# Patient Record
Sex: Female | Born: 1935 | Race: White | Hispanic: No | Smoking: Never smoker
Health system: Southern US, Community
[De-identification: ages and names within clinical notes are randomized; demographics above are authoritative.]

## PROBLEM LIST (undated history)

## (undated) DIAGNOSIS — C801 Malignant (primary) neoplasm, unspecified: Secondary | ICD-10-CM

## (undated) DIAGNOSIS — E079 Disorder of thyroid, unspecified: Secondary | ICD-10-CM

## (undated) DIAGNOSIS — M810 Age-related osteoporosis without current pathological fracture: Secondary | ICD-10-CM

## (undated) DIAGNOSIS — M1990 Unspecified osteoarthritis, unspecified site: Secondary | ICD-10-CM

## (undated) DIAGNOSIS — G4700 Insomnia, unspecified: Secondary | ICD-10-CM

## (undated) DIAGNOSIS — S329XXA Fracture of unspecified parts of lumbosacral spine and pelvis, initial encounter for closed fracture: Secondary | ICD-10-CM

## (undated) DIAGNOSIS — C50919 Malignant neoplasm of unspecified site of unspecified female breast: Secondary | ICD-10-CM

## (undated) DIAGNOSIS — F32A Depression, unspecified: Secondary | ICD-10-CM

## (undated) DIAGNOSIS — E785 Hyperlipidemia, unspecified: Secondary | ICD-10-CM

## (undated) DIAGNOSIS — K219 Gastro-esophageal reflux disease without esophagitis: Secondary | ICD-10-CM

## (undated) DIAGNOSIS — F329 Major depressive disorder, single episode, unspecified: Secondary | ICD-10-CM

## (undated) DIAGNOSIS — Z950 Presence of cardiac pacemaker: Secondary | ICD-10-CM

## (undated) DIAGNOSIS — I4891 Unspecified atrial fibrillation: Secondary | ICD-10-CM

## (undated) DIAGNOSIS — I10 Essential (primary) hypertension: Secondary | ICD-10-CM

## (undated) HISTORY — DX: Fracture of unspecified parts of lumbosacral spine and pelvis, initial encounter for closed fracture: S32.9XXA

## (undated) HISTORY — DX: Disorder of thyroid, unspecified: E07.9

## (undated) HISTORY — DX: Essential (primary) hypertension: I10

## (undated) HISTORY — DX: Malignant (primary) neoplasm, unspecified: C80.1

## (undated) HISTORY — DX: Hyperlipidemia, unspecified: E78.5

## (undated) HISTORY — DX: Age-related osteoporosis without current pathological fracture: M81.0

## (undated) HISTORY — DX: Unspecified osteoarthritis, unspecified site: M19.90

## (undated) HISTORY — DX: Gastro-esophageal reflux disease without esophagitis: K21.9

## (undated) HISTORY — PX: ABDOMINAL HYSTERECTOMY: SHX81

## (undated) HISTORY — DX: Depression, unspecified: F32.A

## (undated) HISTORY — DX: Insomnia, unspecified: G47.00

## (undated) HISTORY — PX: CARDIAC DEFIBRILLATOR PLACEMENT: SHX171

## (undated) HISTORY — DX: Presence of cardiac pacemaker: Z95.0

## (undated) HISTORY — PX: CHOLECYSTECTOMY: SHX55

---
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
## 2000-04-17 ENCOUNTER — Encounter: Admission: RE | Admit: 2000-04-17 | Discharge: 2000-04-17 | Payer: Self-pay | Admitting: Rheumatology

## 2000-04-17 ENCOUNTER — Encounter: Payer: Self-pay | Admitting: Rheumatology

## 2001-02-12 ENCOUNTER — Ambulatory Visit (HOSPITAL_COMMUNITY): Admission: RE | Admit: 2001-02-12 | Discharge: 2001-02-12 | Payer: Self-pay | Admitting: Gastroenterology

## 2001-02-23 ENCOUNTER — Encounter: Payer: Self-pay | Admitting: *Deleted

## 2001-02-24 ENCOUNTER — Encounter: Payer: Self-pay | Admitting: General Surgery

## 2001-02-24 ENCOUNTER — Inpatient Hospital Stay (HOSPITAL_COMMUNITY): Admission: EM | Admit: 2001-02-24 | Discharge: 2001-03-01 | Payer: Self-pay | Admitting: *Deleted

## 2001-02-25 ENCOUNTER — Encounter: Payer: Self-pay | Admitting: General Surgery

## 2001-03-08 ENCOUNTER — Encounter: Admission: RE | Admit: 2001-03-08 | Discharge: 2001-06-06 | Payer: Self-pay | Admitting: General Surgery

## 2001-06-13 ENCOUNTER — Encounter: Payer: Self-pay | Admitting: Neurosurgery

## 2001-06-13 ENCOUNTER — Encounter: Admission: RE | Admit: 2001-06-13 | Discharge: 2001-06-13 | Payer: Self-pay | Admitting: Neurosurgery

## 2001-08-15 ENCOUNTER — Encounter: Payer: Self-pay | Admitting: *Deleted

## 2001-08-15 ENCOUNTER — Encounter: Admission: RE | Admit: 2001-08-15 | Discharge: 2001-08-15 | Payer: Self-pay | Admitting: *Deleted

## 2001-09-07 ENCOUNTER — Encounter: Payer: Self-pay | Admitting: Rheumatology

## 2001-09-07 ENCOUNTER — Encounter: Admission: RE | Admit: 2001-09-07 | Discharge: 2001-09-07 | Payer: Self-pay | Admitting: Rheumatology

## 2002-05-22 ENCOUNTER — Encounter: Payer: Self-pay | Admitting: Rheumatology

## 2002-05-22 ENCOUNTER — Encounter: Admission: RE | Admit: 2002-05-22 | Discharge: 2002-05-22 | Payer: Self-pay | Admitting: Rheumatology

## 2002-11-20 ENCOUNTER — Encounter: Payer: Self-pay | Admitting: Rheumatology

## 2002-11-20 ENCOUNTER — Encounter: Admission: RE | Admit: 2002-11-20 | Discharge: 2002-11-20 | Payer: Self-pay | Admitting: Rheumatology

## 2003-05-08 ENCOUNTER — Encounter
Admission: RE | Admit: 2003-05-08 | Discharge: 2003-08-06 | Payer: Self-pay | Admitting: Physical Medicine & Rehabilitation

## 2003-11-06 ENCOUNTER — Encounter: Admission: RE | Admit: 2003-11-06 | Discharge: 2003-11-06 | Payer: Self-pay | Admitting: Rheumatology

## 2004-01-08 ENCOUNTER — Encounter: Admission: RE | Admit: 2004-01-08 | Discharge: 2004-01-08 | Payer: Self-pay | Admitting: Rheumatology

## 2004-08-17 ENCOUNTER — Emergency Department (HOSPITAL_COMMUNITY): Admission: EM | Admit: 2004-08-17 | Discharge: 2004-08-17 | Payer: Self-pay | Admitting: Emergency Medicine

## 2004-11-07 HISTORY — PX: ELBOW SURGERY: SHX618

## 2005-01-27 ENCOUNTER — Encounter
Admission: RE | Admit: 2005-01-27 | Discharge: 2005-04-27 | Payer: Self-pay | Admitting: Physical Medicine & Rehabilitation

## 2005-01-31 ENCOUNTER — Ambulatory Visit: Payer: Self-pay | Admitting: Physical Medicine & Rehabilitation

## 2005-03-11 ENCOUNTER — Encounter: Admission: RE | Admit: 2005-03-11 | Discharge: 2005-03-11 | Payer: Self-pay | Admitting: Rheumatology

## 2005-06-27 ENCOUNTER — Inpatient Hospital Stay (HOSPITAL_COMMUNITY): Admission: EM | Admit: 2005-06-27 | Discharge: 2005-06-30 | Payer: Self-pay | Admitting: Emergency Medicine

## 2006-03-14 ENCOUNTER — Encounter: Admission: RE | Admit: 2006-03-14 | Discharge: 2006-03-14 | Payer: Self-pay | Admitting: Rheumatology

## 2007-04-04 ENCOUNTER — Encounter: Admission: RE | Admit: 2007-04-04 | Discharge: 2007-04-04 | Payer: Self-pay | Admitting: Rheumatology

## 2008-04-04 ENCOUNTER — Encounter: Admission: RE | Admit: 2008-04-04 | Discharge: 2008-04-04 | Payer: Self-pay | Admitting: Rheumatology

## 2008-11-07 DIAGNOSIS — S329XXA Fracture of unspecified parts of lumbosacral spine and pelvis, initial encounter for closed fracture: Secondary | ICD-10-CM

## 2008-11-07 DIAGNOSIS — Z950 Presence of cardiac pacemaker: Secondary | ICD-10-CM

## 2008-11-07 HISTORY — DX: Presence of cardiac pacemaker: Z95.0

## 2008-11-07 HISTORY — DX: Fracture of unspecified parts of lumbosacral spine and pelvis, initial encounter for closed fracture: S32.9XXA

## 2009-04-08 ENCOUNTER — Encounter: Admission: RE | Admit: 2009-04-08 | Discharge: 2009-04-08 | Payer: Self-pay | Admitting: Rheumatology

## 2009-11-07 DIAGNOSIS — C50919 Malignant neoplasm of unspecified site of unspecified female breast: Secondary | ICD-10-CM

## 2009-11-07 DIAGNOSIS — C801 Malignant (primary) neoplasm, unspecified: Secondary | ICD-10-CM

## 2009-11-07 HISTORY — DX: Malignant neoplasm of unspecified site of unspecified female breast: C50.919

## 2009-11-07 HISTORY — DX: Malignant (primary) neoplasm, unspecified: C80.1

## 2010-04-13 ENCOUNTER — Encounter: Admission: RE | Admit: 2010-04-13 | Discharge: 2010-04-13 | Payer: Self-pay | Admitting: Internal Medicine

## 2010-04-20 ENCOUNTER — Encounter: Admission: RE | Admit: 2010-04-20 | Discharge: 2010-04-20 | Payer: Self-pay | Admitting: Internal Medicine

## 2010-04-23 ENCOUNTER — Encounter: Admission: RE | Admit: 2010-04-23 | Discharge: 2010-04-23 | Payer: Self-pay | Admitting: Internal Medicine

## 2010-05-23 HISTORY — PX: BREAST LUMPECTOMY: SHX2

## 2010-05-28 ENCOUNTER — Encounter: Admission: RE | Admit: 2010-05-28 | Discharge: 2010-05-28 | Payer: Self-pay | Admitting: General Surgery

## 2010-05-28 ENCOUNTER — Ambulatory Visit (HOSPITAL_COMMUNITY): Admission: RE | Admit: 2010-05-28 | Discharge: 2010-05-28 | Payer: Self-pay | Admitting: General Surgery

## 2010-06-09 ENCOUNTER — Ambulatory Visit: Payer: Self-pay | Admitting: Oncology

## 2010-06-23 LAB — COMPREHENSIVE METABOLIC PANEL
ALT: 16 U/L (ref 0–35)
Albumin: 4.4 g/dL (ref 3.5–5.2)
Alkaline Phosphatase: 50 U/L (ref 39–117)
BUN: 17 mg/dL (ref 6–23)
Calcium: 9.5 mg/dL (ref 8.4–10.5)
Glucose, Bld: 93 mg/dL (ref 70–99)
Total Protein: 7 g/dL (ref 6.0–8.3)

## 2010-06-23 LAB — CBC WITH DIFFERENTIAL/PLATELET
BASO%: 0.5 % (ref 0.0–2.0)
Eosinophils Absolute: 0.3 10*3/uL (ref 0.0–0.5)
HCT: 40.6 % (ref 34.8–46.6)
MCV: 90.2 fL (ref 79.5–101.0)
NEUT#: 3.9 10*3/uL (ref 1.5–6.5)
NEUT%: 58.9 % (ref 38.4–76.8)
Platelets: 282 10*3/uL (ref 145–400)
WBC: 6.6 10*3/uL (ref 3.9–10.3)
lymph#: 1.8 10*3/uL (ref 0.9–3.3)

## 2010-06-23 LAB — CANCER ANTIGEN 27.29: CA 27.29: 6 U/mL (ref 0–39)

## 2010-07-02 ENCOUNTER — Encounter: Admission: RE | Admit: 2010-07-02 | Discharge: 2010-07-02 | Payer: Self-pay | Admitting: Internal Medicine

## 2010-08-20 ENCOUNTER — Ambulatory Visit: Payer: Self-pay | Admitting: Oncology

## 2010-08-24 LAB — CBC WITH DIFFERENTIAL/PLATELET
BASO%: 0.4 % (ref 0.0–2.0)
Basophils Absolute: 0 10*3/uL (ref 0.0–0.1)
Eosinophils Absolute: 0.2 10*3/uL (ref 0.0–0.5)
LYMPH%: 29 % (ref 14.0–49.7)
MCV: 89.3 fL (ref 79.5–101.0)
MONO%: 8.5 % (ref 0.0–14.0)
Platelets: 259 10*3/uL (ref 145–400)
RBC: 4.35 10*6/uL (ref 3.70–5.45)
WBC: 6.9 10*3/uL (ref 3.9–10.3)
lymph#: 2 10*3/uL (ref 0.9–3.3)

## 2010-08-25 LAB — COMPREHENSIVE METABOLIC PANEL
ALT: 14 U/L (ref 0–35)
AST: 23 U/L (ref 0–37)
Albumin: 4.2 g/dL (ref 3.5–5.2)
BUN: 16 mg/dL (ref 6–23)
CO2: 23 mEq/L (ref 19–32)
Calcium: 9.7 mg/dL (ref 8.4–10.5)
Creatinine, Ser: 0.74 mg/dL (ref 0.40–1.20)
Total Bilirubin: 0.4 mg/dL (ref 0.3–1.2)
Total Protein: 6.6 g/dL (ref 6.0–8.3)

## 2010-08-25 LAB — VITAMIN D 25 HYDROXY (VIT D DEFICIENCY, FRACTURES): Vit D, 25-Hydroxy: 52 ng/mL (ref 30–89)

## 2010-09-01 ENCOUNTER — Ambulatory Visit (HOSPITAL_COMMUNITY): Admission: RE | Admit: 2010-09-01 | Discharge: 2010-09-01 | Payer: Self-pay | Admitting: Oncology

## 2010-10-15 ENCOUNTER — Encounter
Admission: RE | Admit: 2010-10-15 | Discharge: 2010-10-15 | Payer: Self-pay | Source: Home / Self Care | Attending: Internal Medicine | Admitting: Internal Medicine

## 2010-11-16 ENCOUNTER — Encounter
Admission: RE | Admit: 2010-11-16 | Discharge: 2010-11-16 | Payer: Self-pay | Source: Home / Self Care | Attending: Oncology | Admitting: Oncology

## 2010-11-23 ENCOUNTER — Ambulatory Visit: Payer: Self-pay | Admitting: Oncology

## 2010-11-25 LAB — CBC WITH DIFFERENTIAL/PLATELET
BASO%: 0.4 % (ref 0.0–2.0)
Basophils Absolute: 0 10*3/uL (ref 0.0–0.1)
Eosinophils Absolute: 0.4 10*3/uL (ref 0.0–0.5)
HCT: 39.7 % (ref 34.8–46.6)
LYMPH%: 26.7 % (ref 14.0–49.7)
MCV: 90.3 fL (ref 79.5–101.0)
MONO#: 0.5 10*3/uL (ref 0.1–0.9)
MONO%: 6.7 % (ref 0.0–14.0)
NEUT#: 4.4 10*3/uL (ref 1.5–6.5)
NEUT%: 60.3 % (ref 38.4–76.8)
Platelets: 237 10*3/uL (ref 145–400)
RBC: 4.4 10*6/uL (ref 3.70–5.45)
RDW: 13.7 % (ref 11.2–14.5)
WBC: 7.3 10*3/uL (ref 3.9–10.3)
lymph#: 2 10*3/uL (ref 0.9–3.3)

## 2010-11-26 LAB — COMPREHENSIVE METABOLIC PANEL
ALT: 15 U/L (ref 0–35)
AST: 22 U/L (ref 0–37)
Albumin: 4.4 g/dL (ref 3.5–5.2)
Alkaline Phosphatase: 45 U/L (ref 39–117)
BUN: 17 mg/dL (ref 6–23)
CO2: 24 mEq/L (ref 19–32)
Calcium: 10.2 mg/dL (ref 8.4–10.5)
Chloride: 103 mEq/L (ref 96–112)
Creatinine, Ser: 0.76 mg/dL (ref 0.40–1.20)
Glucose, Bld: 123 mg/dL — ABNORMAL HIGH (ref 70–99)
Potassium: 4.2 mEq/L (ref 3.5–5.3)
Sodium: 139 mEq/L (ref 135–145)
Total Bilirubin: 0.5 mg/dL (ref 0.3–1.2)
Total Protein: 6.8 g/dL (ref 6.0–8.3)

## 2010-11-26 LAB — VITAMIN D 25 HYDROXY (VIT D DEFICIENCY, FRACTURES): Vit D, 25-Hydroxy: 47 ng/mL (ref 30–89)

## 2010-11-28 ENCOUNTER — Encounter: Payer: Self-pay | Admitting: Internal Medicine

## 2010-12-13 ENCOUNTER — Encounter (HOSPITAL_BASED_OUTPATIENT_CLINIC_OR_DEPARTMENT_OTHER): Payer: MEDICARE | Admitting: Oncology

## 2010-12-13 DIAGNOSIS — I82509 Chronic embolism and thrombosis of unspecified deep veins of unspecified lower extremity: Secondary | ICD-10-CM

## 2010-12-13 DIAGNOSIS — Z170 Estrogen receptor positive status [ER+]: Secondary | ICD-10-CM

## 2010-12-13 DIAGNOSIS — C50419 Malignant neoplasm of upper-outer quadrant of unspecified female breast: Secondary | ICD-10-CM

## 2010-12-13 DIAGNOSIS — Z7901 Long term (current) use of anticoagulants: Secondary | ICD-10-CM

## 2011-01-22 LAB — COMPREHENSIVE METABOLIC PANEL
Albumin: 4.3 g/dL (ref 3.5–5.2)
BUN: 10 mg/dL (ref 6–23)
GFR calc Af Amer: >60 mL/min (ref >60)
Glucose, Bld: 102 mg/dL — ABNORMAL HIGH (ref 70–99)
Potassium: 4.6 mEq/L (ref 3.5–5.1)
Sodium: 140 mEq/L (ref 135–145)
Total Protein: 7 g/dL (ref 6.0–8.3)

## 2011-01-22 LAB — CBC
HCT: 42.8 % (ref 36.0–46.0)
Hemoglobin: 14.6 g/dL (ref 12.0–15.0)
MCH: 31.3 pg (ref 26.0–34.0)
MCV: 91.4 fL (ref 78.0–100.0)
Platelets: 248 10*3/uL (ref 150–400)
RBC: 4.68 MIL/uL (ref 3.87–5.11)

## 2011-01-22 LAB — DIFFERENTIAL
Basophils Relative: 1 % (ref 0–1)
Eosinophils Relative: 5 % (ref 0–5)
Lymphs Abs: 2.2 10*3/uL (ref 0.7–4.0)

## 2011-01-22 LAB — PROTIME-INR
INR: 1.09 (ref 0.00–1.49)
Prothrombin Time: 14 seconds (ref 11.6–15.2)

## 2011-01-22 LAB — APTT: aPTT: 27 seconds (ref 24–37)

## 2011-01-22 LAB — SURGICAL PCR SCREEN: Staphylococcus aureus: NEGATIVE

## 2011-03-21 ENCOUNTER — Other Ambulatory Visit: Payer: Self-pay | Admitting: Internal Medicine

## 2011-03-21 DIAGNOSIS — Z1231 Encounter for screening mammogram for malignant neoplasm of breast: Secondary | ICD-10-CM

## 2011-03-21 DIAGNOSIS — Z853 Personal history of malignant neoplasm of breast: Secondary | ICD-10-CM

## 2011-03-25 NOTE — Op Note (Signed)
NAME:  [name], [name] NO.:  [id number]   MEDICAL RECORD NO.:  [id number]          PATIENT TYPE:  INP   LOCATION:  5038                         FACILITY:  [facility]   PHYSICIAN:  Nadara Mustard, MD     DATE OF BIRTH:  1926/01/03   DATE OF PROCEDURE:  06/27/2005  DATE OF DISCHARGE:                                 OPERATIVE REPORT   PREOPERATIVE DIAGNOSIS:  Comminuted left olecranon fracture.   POSTOPERATIVE DIAGNOSIS:  Comminuted left olecranon fracture.   PROCEDURE:  Open reduction and internal fixation, left olecranon.   SURGEON.:  Nadara Mustard, M.D.   ANESTHESIA:  General plus axillary block on the left.   ESTIMATED BLOOD LOSS:  Minimal.   ANTIBIOTICS:  Kefzol 1 gram.   DRAINS:  None.   COMPLICATIONS:  None.   DISPOSITION:  To PACU in stable condition.   INDICATIONS FOR PROCEDURE:  The patient is a 77 year old woman who fell at  home sustaining a closed left olecranon fracture. The patient was admitted  last night, stabilized medically, and presents at this time for open  reduction and internal fixation. She had a completely displaced segmental  olecranon fracture. The risks and benefits were discussed with the patient  and her family including infection, neurovascular injury, persistent pain,  arthritis, failure of fixation, and need for additional surgery. The patient  and family state they understand and wish to proceed at this time.   DESCRIPTION OF PROCEDURE:  The patient was brought to OR room four and  underwent a general anesthetic. After adequate level of anesthesia was  obtained, the patient's left upper extremity was prepped using DuraPrep,  draped into a sterile field, and Ioban was used to cover all exposed skin. A  dorsal incision was made just on the ulnar border of the olecranon. The  ulnar nerve was protected. This was carried sharply down to bone and the  fracture fragments were freshened. There was a comminuted  piece distally of  the ulna and this was stabilized with a lag screw using a 26 mm 3.5 cortical  screw. The remaining  olecranon fragment was reduced and then this was  stabilized with two 2.0 mm K-wires and then a 20-mm tension band was used in  a figure-of-eight technique to reduce the fracture. C-arm fluoroscopy  verified reduction of both AP and lateral planes. The ends of the K-wires  were bent. The wound was irrigated with normal saline. The subcu was closed  using 2-0 Vicryl. The skin was closed using approximating staples. The wound  was covered with Adaptic orthopedic sponges, sterile Webril, and a Coban  dressing. Prior to placing the dressing, the patient's elbow was placed  through a full range of motion. There was no  impingement. There was full supination and pronation. The patient underwent  an axillary block by anesthesia. She was then extubated and taken to PACU in  stable condition. Plan to follow up in the office in 2 weeks to start  physical therapy. Discharge when safe with ambulation.      Nadara Mustard, MD  Electronically  Signed     MVD/MEDQ  D:  06/28/2005  T:  06/29/2005  Job:  [id number]
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
## 2011-03-25 NOTE — Assessment & Plan Note (Signed)
HISTORY:  [name] is back regarding her post-concussive syndrome with  depression, anxiety and insomnia.  [name] took the Rozerem for approximately  one week and found that it did not help her pain.  She essentially went back  to the Ambien.  She has tried to reset her priorities a bit regarding her  friends and activities, and seems to have a little more autonomy in these  areas.  She still feels that she has decreased endurance and fatigue.  She  takes a nap during the day as well.  Her appetite is fair, although her diet  has changed.  She is not eating as healthy as she once was.  She continues  on Paxil 20 mg q.d.   The patient is in a senior apartment and seems to be fairly happy there.   REVIEW OF SYSTEMS:  The patient reports occasional tingling, dizziness and  decreased taste.  No specific other changes since her last visit.  She is  concerned about her weight gain.  A full review of systems is in the health  and history section of the chart.   PHYSICAL EXAMINATION:  VITAL SIGNS:  Blood pressure 160/80, pulse 61,  respirations 20.  Saturation is 98% on room air.  GENERAL:  The patient is pleasant, alert and oriented x3.  Affect is bright  and appropriate.  She is well-kempt.  No constitutional symptoms today.  HEART:  A regular rhythm.  LUNGS:  Clear.  ABDOMEN:  Soft, nontender.  NEUROLOGIC:  Essentially is intact today with good strength, sensation and  cognition.  She is a bit anxious overall, however.  EXTREMITIES:  Unchanged.   ASSESSMENT:  1.  Post-concussive syndrome.  2.  Depression and anxiety.  3.  Insomnia.   PLAN:  1.  I have no further suggestions at this point from a medicinal standpoint.      We did talk at length about dietary changes and exercise, which I think      are paramount for her.  She needs to work on weight loss and eating      foods that will help give her more energy.  2.  Also she lets her anxiety get the best of her on many occasions.  I  will      continue her on Ambien for sleep as needed, and stay with the Paxil 20      mg for now.  3.  We discussed specific exercise programs that she should work on.  4.  The patient may follow up with me as needed in the future in my office.      ZTS/MedQ  D:  03/09/2005 10:48:00  T:  03/09/2005 11:10:53  Job #:  [id number]   cc:   Demetria Pore. Coral Spikes, M.D.  [address]  [address]  [place]  [place] [address]  Fax: [phone]

## 2011-03-25 NOTE — Procedures (Signed)
[place]. [facility]  Patient:    [name], [name]                         MRN: [id number] Proc. Date: 02/12/01 Attending:  Verlin Grills, M.D.                           Procedure Report  INDICATIONS:  [name] (date of birth 03-30-1926) is a 77 year old female who is scheduled to undergo surveillance colonoscopy and polypectomy to prevent colon cancer.  [name] brother developed colon cancer in his 85s.  [name] denies gastrointestinal bleeding or a personal history of colorectal neoplasia.  [name] reviewed our colonoscopy education film.  I discussed with her the complications associated with colonoscopy and polypectomy including a 1 per 1000 risk of bleeding and 4 per 1000 risk of colonic perforation requiring emergency surgery.  [name] has signed the operative permit.  MEDICATIONS: 1. Synthroid. 2. Ortho-Est. 3. Multivitamin. 4. Calcium. 5. Ambien.  PAST MEDICAL HISTORY:  Hypothyroidism, hyperlipidemia, gastroesophageal reflux disease, remote TAH/BSO.  ENDOSCOPIST:  Verlin Grills, M.D.  PREMEDICATION:  Fentanyl 50 mcg, Versed 5 mg.  ENDOSCOPE:  Olympus pediatric colonoscope.  DESCRIPTION OF PROCEDURE:  After obtaining informed consent, the patient was placed in the left lateral decubitus position.  I administered intravenous fentanyl and intravenous Versed to achieve conscious sedation for the procedure.  The patients blood pressure, oxygen saturation, and cardiac rhythm were monitored throughout the procedure and documented in the medical record.  Anal inspection was normal.  Digital rectal examination was normal.  The Olympus pediatric video colonoscope was introduced into the rectum and under direct vision advanced to the cecum which was identified by normal appearing ileocecal valve.  Colonic preparation for the examination today was excellent.  [name] has Universal colonic diverticulosis.   Diverticuli are more extensive in the left colon as compared to the right colon.  There is no endoscopic evidence for the presence of diverticulitis or stricture formation.  Rectum is normal.  Sigmoid colon and descending colon normal.  Splenic flexure normal.  Transverse colon normal.  Hepatic flexure normal.  Ascending colon normal.  Cecum at ileocecal valve normal.  ASSESSMENT: 1. Universal colonic diverticulosis. 2. No endoscopic evidence for the presence of colorectal neoplasia.  RECOMMENDATIONS:  Repeat colonoscopy in approximately 10 years. DD:  02/12/01 TD:  02/12/01 Job: [id number]
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
## 2011-03-25 NOTE — Discharge Summary (Signed)
NAME:  JENISSE, VULLO NO.:  [id number]   MEDICAL RECORD NO.:  [id number]          PATIENT TYPE:  INP   LOCATION:  5038                         FACILITY:  [facility]   PHYSICIAN:  Nadara Mustard, MD     DATE OF BIRTH:  12-Aug-1926   DATE OF ADMISSION:  06/27/2005  DATE OF DISCHARGE:  06/30/2005                                 DISCHARGE SUMMARY   DIAGNOSIS:  Comminuted left olecranon fracture.   PROCEDURE:  Open reduction internal fixation left olecranon.   DISCHARGED TO HOME:  In stable condition.   FOLLOWUP:  In the office in 2 weeks.   PRESCRIPTIONS GIVEN:  For for Vicodin and Darvocet for pain.   HISTORY OF PRESENT ILLNESS:  The patient is a 77 year old woman who fell  sustaining a left olecranon closed fracture. The patient was evaluated  medically by Dr. Pete Glatter, felt to be safe for surgical intervention; and  the patient presents for open reduction internal fixation of her left  olecranon fracture.   HOSPITAL COURSE:  The patient's hospital course was essentially  unremarkable. She underwent ORIF of her left olecranon fracture on August  22.  She received an axillary block plus general anesthesia.  She received  Kefzol for infection prophylaxis for 24 hours. Postoperatively, the  patient's vital signs were stable. Her left upper extremity was  neurovascularly intact. The patient progressed well with therapy and the  patient was discharged to home in stable condition on August 24 with follow-  up in the office in 2 weeks.      Nadara Mustard, MD  Electronically Signed     MVD/MEDQ  D:  08/24/2005  T:  08/24/2005  Job:  [id number]

## 2011-04-08 ENCOUNTER — Encounter (INDEPENDENT_AMBULATORY_CARE_PROVIDER_SITE_OTHER): Payer: Self-pay | Admitting: General Surgery

## 2011-04-19 ENCOUNTER — Inpatient Hospital Stay (HOSPITAL_COMMUNITY)
Admission: AD | Admit: 2011-04-19 | Discharge: 2011-04-22 | DRG: 378 | Disposition: A | Discharge status: Home / Self Care | Payer: Medicare Other | Source: Ambulatory Visit | Attending: Internal Medicine | Admitting: Internal Medicine

## 2011-04-19 DIAGNOSIS — Z86718 Personal history of other venous thrombosis and embolism: Secondary | ICD-10-CM

## 2011-04-19 DIAGNOSIS — F3289 Other specified depressive episodes: Secondary | ICD-10-CM | POA: Diagnosis present

## 2011-04-19 DIAGNOSIS — Z95810 Presence of automatic (implantable) cardiac defibrillator: Secondary | ICD-10-CM

## 2011-04-19 DIAGNOSIS — E785 Hyperlipidemia, unspecified: Secondary | ICD-10-CM | POA: Diagnosis present

## 2011-04-19 DIAGNOSIS — K219 Gastro-esophageal reflux disease without esophagitis: Secondary | ICD-10-CM | POA: Diagnosis present

## 2011-04-19 DIAGNOSIS — D62 Acute posthemorrhagic anemia: Secondary | ICD-10-CM | POA: Diagnosis present

## 2011-04-19 DIAGNOSIS — F329 Major depressive disorder, single episode, unspecified: Secondary | ICD-10-CM | POA: Diagnosis present

## 2011-04-19 DIAGNOSIS — Z7982 Long term (current) use of aspirin: Secondary | ICD-10-CM

## 2011-04-19 DIAGNOSIS — K5731 Diverticulosis of large intestine without perforation or abscess with bleeding: Principal | ICD-10-CM | POA: Diagnosis present

## 2011-04-19 DIAGNOSIS — M810 Age-related osteoporosis without current pathological fracture: Secondary | ICD-10-CM | POA: Diagnosis present

## 2011-04-19 DIAGNOSIS — Z800 Family history of malignant neoplasm of digestive organs: Secondary | ICD-10-CM

## 2011-04-19 DIAGNOSIS — E039 Hypothyroidism, unspecified: Secondary | ICD-10-CM | POA: Diagnosis present

## 2011-04-19 DIAGNOSIS — Z79899 Other long term (current) drug therapy: Secondary | ICD-10-CM

## 2011-04-19 DIAGNOSIS — Z853 Personal history of malignant neoplasm of breast: Secondary | ICD-10-CM

## 2011-04-19 DIAGNOSIS — I10 Essential (primary) hypertension: Secondary | ICD-10-CM | POA: Diagnosis present

## 2011-04-19 LAB — CBC
HCT: 34.8 % — ABNORMAL LOW (ref 36.0–46.0)
Hemoglobin: 11.8 g/dL — ABNORMAL LOW (ref 12.0–15.0)
MCV: 88.1 fL (ref 78.0–100.0)
WBC: 9.9 10*3/uL (ref 4.0–10.5)

## 2011-04-19 LAB — COMPREHENSIVE METABOLIC PANEL
ALT: 15 U/L (ref 0–35)
AST: 18 U/L (ref 0–37)
Albumin: 3.9 g/dL (ref 3.5–5.2)
Alkaline Phosphatase: 40 U/L (ref 39–117)
Potassium: 4.1 mEq/L (ref 3.5–5.1)
Sodium: 140 mEq/L (ref 135–145)
Total Protein: 6.9 g/dL (ref 6.0–8.3)

## 2011-04-19 LAB — HEMOGLOBIN AND HEMATOCRIT, BLOOD: Hemoglobin: 11.7 g/dL — ABNORMAL LOW (ref 12.0–15.0)

## 2011-04-20 LAB — HEMOGLOBIN AND HEMATOCRIT, BLOOD
HCT: 31.9 % — ABNORMAL LOW (ref 36.0–46.0)
Hemoglobin: 12 g/dL (ref 12.0–15.0)

## 2011-04-20 NOTE — H&P (Signed)
[name], [name] NO.:  [id number]  MEDICAL RECORD NO.:  [id number]  LOCATION:  3737                         FACILITY:  [facility]  PHYSICIAN:  Deirdre Peer. Maurisa Tesmer, M.D. DATE OF BIRTH:  [birth year]  DATE OF ADMISSION:  04/19/2011 DATE OF DISCHARGE:                             HISTORY & PHYSICAL   CHIEF COMPLAINT:  Blood per rectum.  HISTORY OF PRESENT ILLNESS:  A pleasant 77 year old female presents to the office with complaint of blood per rectum x3 days.  There is no abdominal pain.  No nausea, no vomiting.  She has no history of GI bleed.  Her last colonoscopy was in 2002 at that time revealed universal diverticulosis.  Over the weekend on Sunday, the patient had a bowel movement.  When she looked down, she noticed there was dark blood in the stool.  She had similar occurrence Monday and again today.  She is even noticed some bright red blood.  She felt a little woozy today therefore she presented to the office for evaluation.  MEDICATIONS:  Include prednisone and tramadol for pain because of sciatica.  PHYSICAL EXAMINATION:  General:  In the office today, she is alert.  She is oriented.  She is hemodynamically stable.  Admission is deemed necessary for further evaluation of GI bleed probably lower GI bleed secondary to diverticulosis.  Of note, she was scheduled to have follow- up colonoscopy in 2012.  PAST MEDICAL HISTORY:  Significant for hypothyroidism, hyperlipidemia, hypertension, GERD, depression, osteoporosis, previous history of deep venous thrombosis currently not on Coumadin, history of pelvic fracture, status post fall, breast CA status post lumpectomy for invasive ductal breast CA, history of AICD was placed in 2010.  MEDICATIONS: 1. P.r.n. Tylenol. 2. Multivitamin. 3. Calcium. 4. Vitamin D. 5. Anastrozole 1 mg daily. 6. Mag oxide 400 mg daily. 7. Toprol-XL 25 mg b.i.d. 8. Alendronate 70 mg weekly. 9. Synthroid 88 mcg daily. 10.Ambien  10 mg daily. 11.Paroxetine 20 mg daily. 12.Aspirin 81 mg daily. 13.Tramadol 50 mg q. 4 p.r.n.  SOCIAL HISTORY:  Negative for tobacco, alcohol or drugs.  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  FAMILY HISTORY:  Noncontributory.  PHYSICAL EXAM:  VITAL SIGNS:  Weight 195, blood pressure 160/72, temperature 97.6. HEENT:  Significant for pale sclerae, otherwise unremarkable. LUNGS:  Clear without rales or rhonchi. CARDIOVASCULAR:  Regular S1, S2.  No S3 appreciated. ABDOMEN:  Soft, nontender.  No hepatosplenomegaly. EXTREMITIES:  No clubbing, cyanosis or edema. RECTAL:  Maroon, stool present, grossly heme positive.  ASSESSMENT:  Gastrointestinal bleed.  Recommend the patient be admitted to a telemetry floor bed.  Patient will have serial H and H, PPI will be started, GI evaluation determine if colonoscopy is indicated.  However, please note the patient is due for followup colonoscopy in 2012.  The patient has several other chronic medical problems which are stable.  We will resume her meds as her course dictates.  Thank you in advance.     Deirdre Peer. Erion Weightman, M.D.     RDP/MEDQ  D:  04/19/2011  T:  04/19/2011  Job:  [id number]  Electronically Signed by Windy Fast Tiwana Chavis M.D. on 04/20/2011 02:21:28 PM

## 2011-04-21 LAB — CBC
HCT: 31 % — ABNORMAL LOW (ref 36.0–46.0)
Hemoglobin: 10.4 g/dL — ABNORMAL LOW (ref 12.0–15.0)
Platelets: 285 10*3/uL (ref 150–400)
RBC: 3.96 MIL/uL (ref 3.87–5.11)
WBC: 11.2 10*3/uL — ABNORMAL HIGH (ref 4.0–10.5)
WBC: 9.2 10*3/uL (ref 4.0–10.5)

## 2011-04-22 LAB — CROSSMATCH
Antibody Screen: NEGATIVE
Unit division: 0

## 2011-04-26 ENCOUNTER — Other Ambulatory Visit: Payer: Self-pay | Admitting: Orthopedic Surgery

## 2011-04-26 DIAGNOSIS — M545 Low back pain: Secondary | ICD-10-CM

## 2011-04-29 ENCOUNTER — Ambulatory Visit
Admission: RE | Admit: 2011-04-29 | Discharge: 2011-04-29 | Disposition: A | Discharge status: Home / Self Care | Payer: Medicare Other | Source: Ambulatory Visit | Attending: Orthopedic Surgery | Admitting: Orthopedic Surgery

## 2011-04-29 DIAGNOSIS — M545 Low back pain: Secondary | ICD-10-CM

## 2011-05-03 ENCOUNTER — Other Ambulatory Visit: Payer: Medicare Other

## 2011-05-27 NOTE — Discharge Summary (Signed)
  NAMECORNELL, [name] NO.:  [id number]  MEDICAL RECORD NO.:  [id number]  LOCATION:  3737                         FACILITY:  [facility]  PHYSICIAN:  Deirdre Peer. Kloe Oates, M.D. DATE OF BIRTH:  May 23, 1926  DATE OF ADMISSION:  04/19/2011 DATE OF DISCHARGE:  04/22/2011                              DISCHARGE SUMMARY   DISCHARGE DIAGNOSES: 1. Acute lower gastrointestinal bleed secondary to diverticulosis.     Hemoglobin and hematocrit remained stable during this     hospitalization.  No transfusion required.  Discharge hemoglobin     10.4, the patient is status post colonoscopy which revealed     diverticulosis of the left colon, otherwise normal. 2. Blood loss anemia secondary to acute lower gastrointestinal bleed     secondary to diverticulosis 3. Hypothyroidism. 4. Hyperlipidemia. 5. Hypertension. 6. Gastroesophageal reflux disease. 7. Depression. 8. Osteoporosis. 9. History of deep venous thrombosis. 10.History of pelvic fracture. 11.Breast cancer, status post lumpectomy for invasive ductal breast     cancer. 12.Status post automatic implantable cardioverter defibrillator in     2010.  DISCHARGE MEDICATIONS: 1. Folic acid daily. 2. Tramadol q.4 h. p.r.n. 3. Alendronate 70 mg every week. 4. Anastrozole 1 mg daily. 5. Aspirin will be resumed in 1 week. 6. Calcium with vitamin D. 7. OTC eye drops. 8. Fish oil 1 g. 9. Mag oxide daily. 10.Metoprolol 25 mg b.i.d. 11.Multivitamin. 12.Paroxetine 20 mg. 13.Levothyroxine 88 mcg daily. 14.Vitamin C 15.Vitamin D 16.Ambien 10 mg nightly p.r.n. 17.The patient will no longer be taking prednisone.  DISPOSITION:  Discharged to home in stable condition.  CONSULTANTS:  [name], MD, [name].  HISTORY OF PRESENT ILLNESS:  Pleasant 77 year old female who presented to the office with subacute blood per rectum felt to be lower GI in etiology from her known diverticulosis.  Admission was deemed necessary for  further evaluation and treatment.  Past medical history, medications, social history, past surgical history, allergies per admission H and P.  HOSPITAL COURSE:  The patient was admitted to the telemetry floor bed, she was given PPI.  Serial H and H was obtained.  Hemoglobin never dropped below 9, therefore, transfusion was not indicated.  She reported a family history of colon cancer in her brother, she was due for her colonoscopy in 2012.  GI was consulted, she underwent colonoscopy with findings as discussed above.  There were no complications during this hospitalization.  The patient was discharged to home in stable condition.  Asked to resume aspirin in 1 week.  Thank you in advance.     Deirdre Peer. Jamielee Mchale, M.D.     RDP/MEDQ  D:  04/22/2011  T:  04/22/2011  Job:  [id number]  Electronically Signed by Windy Fast Chrislynn Mosely M.D. on 05/27/2011 09:33:45 AM

## 2011-06-01 ENCOUNTER — Ambulatory Visit
Admission: RE | Admit: 2011-06-01 | Discharge: 2011-06-01 | Disposition: A | Discharge status: Home / Self Care | Payer: Medicare Other | Source: Ambulatory Visit | Attending: Internal Medicine | Admitting: Internal Medicine

## 2011-06-01 DIAGNOSIS — Z853 Personal history of malignant neoplasm of breast: Secondary | ICD-10-CM

## 2011-06-01 NOTE — Consult Note (Signed)
  NAME:  [name], [name] NO.:  [id number]  MEDICAL RECORD NO.:  [id number]  LOCATION:  3737                         FACILITY:  [facility]  PHYSICIAN:  [name], M.D.   DATE OF BIRTH:  1926/05/01  DATE OF CONSULTATION:  04/20/2011 DATE OF DISCHARGE:                                CONSULTATION   REASON FOR CONSULTATION:  The patient is an 77 year old female who was admitted to the hospital yesterday because of rectal bleeding, which has been going on for about 3 days.  She did not have any complaints of abdominal pain.  She had a colonoscopy in 2002, which showed diverticulosis universally.  Her hemoglobin and hematocrit on admission were 11.8 and 34.8 respectively.  Today they are 10.7 and 31.9.  She has been on clear liquids since being in the hospital.  There is a family history of colon cancer in her brother.  MEDICATIONS PRIOR TO ADMISSION:  Prednisone, tramadol because of pain from sciatica.  MEDICAL PROBLEMS: 1. Hypothyroidism. 2. Hyperlipidemia. 3. Hypertension. 4. GERD. 5. Depression. 6. Osteoporosis. 7. DVT in the past, but currently not on Coumadin. 8. History of a pelvic fracture. 9. History of breast cancer status post lumpectomy. 10.She has an AICD placed in 2010.  Other medications noted on her H and P and reviewed.  SOCIAL HABITS:  She does not smoke or drink alcohol.  REVIEW OF SYSTEMS:  No chest pain or shortness of breath.  PHYSICAL EXAMINATION:  GENERAL:  She does not appear in any acute distress. HEENT:  She is nonicteric. HEART:  Regular rhythm.  No murmurs. LUNGS:  Clear. ABDOMEN:  Soft, nontender.  No hepatosplenomegaly.  IMPRESSION:  Lower gastrointestinal bleeding in an 77 year old female who has a history of diverticulosis and a family history of colon cancer in her brother.  PLAN:  We will set her up for a colonoscopy tomorrow.  Her last colonoscopy was 10 years ago.          ______________________________ [name], M.D.     SFG/MEDQ  D:  04/20/2011  T:  04/21/2011  Job:  [id number]  cc:   Deirdre Peer. Polite, M.D.  Electronically Signed by Herbert Moors MD on 06/01/2011 03:42:08 PM

## 2011-06-01 NOTE — Op Note (Signed)
  NAME:  [name], [name] NO.:  [id number]  MEDICAL RECORD NO.:  [id number]  LOCATION:  3737                         FACILITY:  [facility]  PHYSICIAN:  [name], M.D.   DATE OF BIRTH:  28-Sep-1926  DATE OF PROCEDURE:  04/21/2011 DATE OF DISCHARGE:                              OPERATIVE REPORT   INDICATIONS FOR PROCEDURE:  Lower GI bleed.  Informed consent was obtained after explanation of the risks of bleeding, infection and perforation.  PREMEDICATIONS:  Fentanyl 75 mcg IV, Versed 4 mg IV.  PROCEDURE:  With the patient in the left lateral decubitus position, a rectal exam was performed and no masses were felt.  The Pentax colonoscope was inserted into the rectum and advanced around the colon to the cecum.  The cecal landmarks were identified.  The cecum and ascending colon were normal.  The transverse colon was normal.  The descending colon and sigmoid revealed diverticulosis.  The rectum was normal.  She tolerated the procedure well without complications.  IMPRESSION:  Diverticulosis of the left colon, otherwise, normal colonoscopy to the cecum.          ______________________________ [name], M.D.     SFG/MEDQ  D:  04/21/2011  T:  04/22/2011  Job:  [id number]  cc:   Deirdre Peer. Polite, M.D.  Electronically Signed by Herbert Moors MD on 06/01/2011 03:42:11 PM

## 2011-06-03 ENCOUNTER — Ambulatory Visit (INDEPENDENT_AMBULATORY_CARE_PROVIDER_SITE_OTHER): Payer: Medicare Other | Admitting: General Surgery

## 2011-06-03 ENCOUNTER — Encounter (INDEPENDENT_AMBULATORY_CARE_PROVIDER_SITE_OTHER): Payer: Self-pay | Admitting: General Surgery

## 2011-06-03 DIAGNOSIS — C50919 Malignant neoplasm of unspecified site of unspecified female breast: Secondary | ICD-10-CM

## 2011-06-03 DIAGNOSIS — Z853 Personal history of malignant neoplasm of breast: Secondary | ICD-10-CM | POA: Insufficient documentation

## 2011-06-03 DIAGNOSIS — J392 Other diseases of pharynx: Secondary | ICD-10-CM

## 2011-06-03 NOTE — Patient Instructions (Signed)
Call as needed for questions. We have made a referral to an ear nose and throat physician.

## 2011-06-03 NOTE — Progress Notes (Signed)
Patient returns for long-term followup with history of left breast cancer status post lumpectomy, no radiation, and adjuvant a rib fracture for a 0.8 cm, grade 1, ER/ER positive cancer of the left breast was widely negative margins at 1 cm. She reports no problems. Specifically no breast lump pain nipple discharge skin changes.  She does complain of a small whitish area on her soft palate that has been present for several weeks appear  On examination she generally looks well. HEENT: No palpable masses or thyromegaly. There is a 2-3 mm white plaque overlying the soft palate on the left side. Nodes: No palpable cervical supraclavicular or axillary does. Breasts: No palpable masses in either breast with particular attention to the lumpectomy site in the upper outer quadrant.  Mammogram at the breast center last week was negative.  Assessment and plan: Doing well with no evidence of recurrent breast cancer. We are going to make a referral to an ENT physician to evaluate the area on her soft palate

## 2011-06-10 ENCOUNTER — Encounter (HOSPITAL_BASED_OUTPATIENT_CLINIC_OR_DEPARTMENT_OTHER): Payer: Medicare Other | Admitting: Oncology

## 2011-06-10 ENCOUNTER — Other Ambulatory Visit: Payer: Self-pay | Admitting: Oncology

## 2011-06-10 DIAGNOSIS — C50419 Malignant neoplasm of upper-outer quadrant of unspecified female breast: Secondary | ICD-10-CM

## 2011-06-10 DIAGNOSIS — Z7901 Long term (current) use of anticoagulants: Secondary | ICD-10-CM

## 2011-06-10 DIAGNOSIS — I82509 Chronic embolism and thrombosis of unspecified deep veins of unspecified lower extremity: Secondary | ICD-10-CM

## 2011-06-10 DIAGNOSIS — Z170 Estrogen receptor positive status [ER+]: Secondary | ICD-10-CM

## 2011-06-10 LAB — COMPREHENSIVE METABOLIC PANEL
Albumin: 4.3 g/dL (ref 3.5–5.2)
Alkaline Phosphatase: 55 U/L (ref 39–117)
BUN: 18 mg/dL (ref 6–23)
Glucose, Bld: 97 mg/dL (ref 70–99)
Potassium: 4.3 mEq/L (ref 3.5–5.3)
Total Bilirubin: 0.4 mg/dL (ref 0.3–1.2)

## 2011-06-10 LAB — CBC WITH DIFFERENTIAL/PLATELET
Basophils Absolute: 0 10*3/uL (ref 0.0–0.1)
Eosinophils Absolute: 0.2 10*3/uL (ref 0.0–0.5)
HCT: 39.7 % (ref 34.8–46.6)
HGB: 13.6 g/dL (ref 11.6–15.9)
LYMPH%: 24.8 % (ref 14.0–49.7)
MCV: 89.3 fL (ref 79.5–101.0)
MONO%: 8.1 % (ref 0.0–14.0)
NEUT#: 4.4 10*3/uL (ref 1.5–6.5)
Platelets: 253 10*3/uL (ref 145–400)

## 2011-06-10 LAB — VITAMIN D 25 HYDROXY (VIT D DEFICIENCY, FRACTURES): Vit D, 25-Hydroxy: 53 ng/mL (ref 30–89)

## 2011-06-14 ENCOUNTER — Encounter (HOSPITAL_BASED_OUTPATIENT_CLINIC_OR_DEPARTMENT_OTHER): Payer: Medicare Other | Admitting: Oncology

## 2011-06-14 DIAGNOSIS — I82509 Chronic embolism and thrombosis of unspecified deep veins of unspecified lower extremity: Secondary | ICD-10-CM

## 2011-06-14 DIAGNOSIS — C50419 Malignant neoplasm of upper-outer quadrant of unspecified female breast: Secondary | ICD-10-CM

## 2011-06-14 DIAGNOSIS — Z7901 Long term (current) use of anticoagulants: Secondary | ICD-10-CM

## 2011-06-14 DIAGNOSIS — Z170 Estrogen receptor positive status [ER+]: Secondary | ICD-10-CM

## 2011-12-29 ENCOUNTER — Ambulatory Visit (INDEPENDENT_AMBULATORY_CARE_PROVIDER_SITE_OTHER): Payer: Medicare Other | Admitting: General Surgery

## 2011-12-29 ENCOUNTER — Encounter (INDEPENDENT_AMBULATORY_CARE_PROVIDER_SITE_OTHER): Payer: Self-pay | Admitting: General Surgery

## 2011-12-29 DIAGNOSIS — C50919 Malignant neoplasm of unspecified site of unspecified female breast: Secondary | ICD-10-CM

## 2011-12-29 NOTE — Progress Notes (Signed)
Chief complaint: Followup breast cancer  History: Patient returns for long-term followup for cancer of left breast status post lumpectomy, no radiation, and now adjuvant arimidex for T1b ER positive tumor. She reports no problems with her breast, specifically no lump, pain, nipple discharge. Mammogram was negative in July. She has some mild chronic back pain.  Exam: General: Elderly, no distress Skin: No rash or infection Lymph nodes: No cervical, subclavicular or axillary nodes palpable Breasts: Well-healed lumpectomy site upper outer left breast with no thickening or mass. The skin changes. No other masses palpable in either breast.  Assessment and plan: Doing well with no evidence of recurrence or new cancer. Return in 6 months.

## 2012-01-18 ENCOUNTER — Other Ambulatory Visit: Payer: Self-pay | Admitting: Dermatology

## 2012-05-01 ENCOUNTER — Other Ambulatory Visit: Payer: Self-pay | Admitting: Internal Medicine

## 2012-05-01 DIAGNOSIS — Z9889 Other specified postprocedural states: Secondary | ICD-10-CM

## 2012-05-01 DIAGNOSIS — Z853 Personal history of malignant neoplasm of breast: Secondary | ICD-10-CM

## 2012-05-02 IMAGING — CT CT L SPINE W/O CM
4 of 10 series · 12 of 33 positions shown, 14 images · non-contrast
Comparison: None

CLINICAL DATA: Low back and leg pain.

CT LUMBAR SPINE WITHOUT CONTRAST
TECHNIQUE: Multidetector CT imaging of the lumbar spine was
performed without intravenous contrast administration. Multiplanar
CT image reconstructions were also generated.

[Series 2: l spine bone · axial · 0.27mm/px · z∈[+51,+126]mm · 2 of 92 slices shown, 3 images]
[im 31/92  soft-tissue]
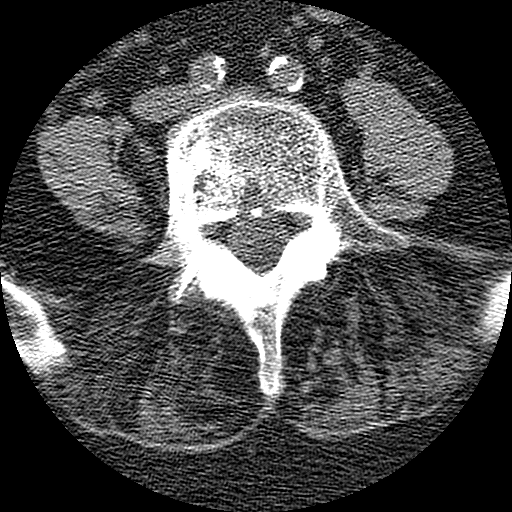
[im 31/92  bone]
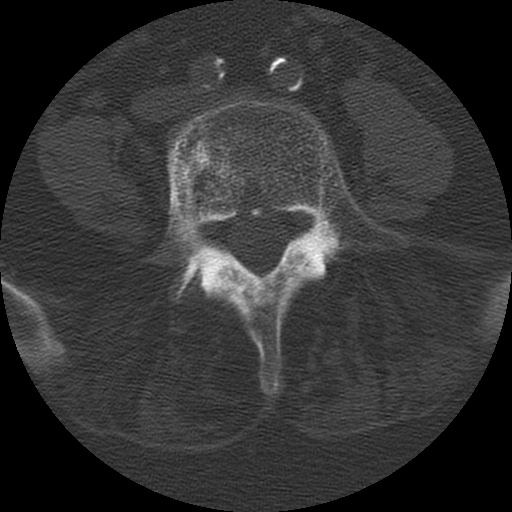
[im 61/92  bone]
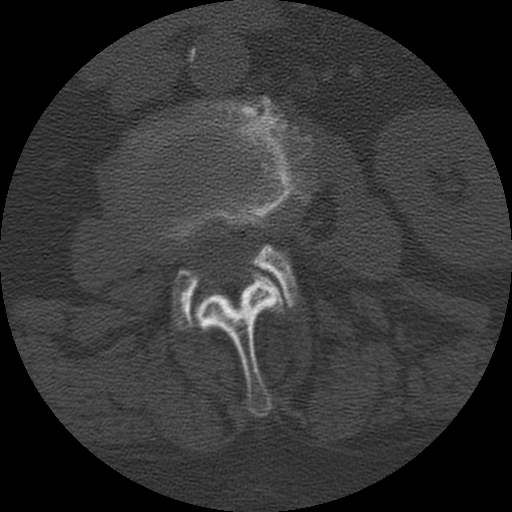

[Series 3: l spine soft · axial · 0.27mm/px · z∈[+51,+126]mm · 2 of 92 slices shown]
[im 31/92  soft-tissue]
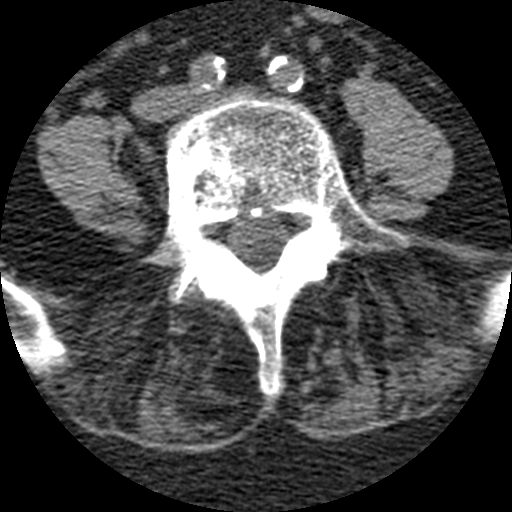
[im 61/92  soft-tissue]
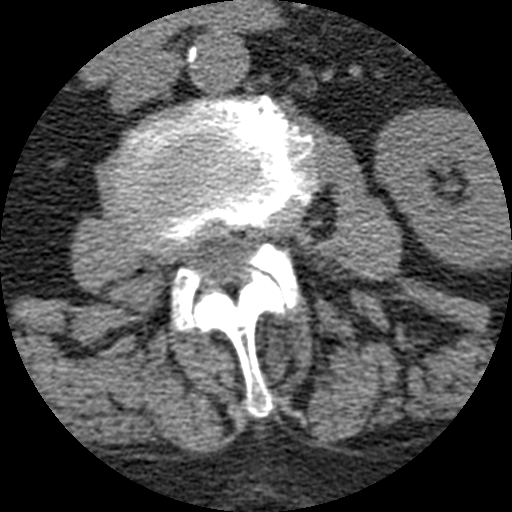

[Series 400: coronals · coronal · 0.46mm/px · 3 of 58 slices shown]
[im 12/58  bone]
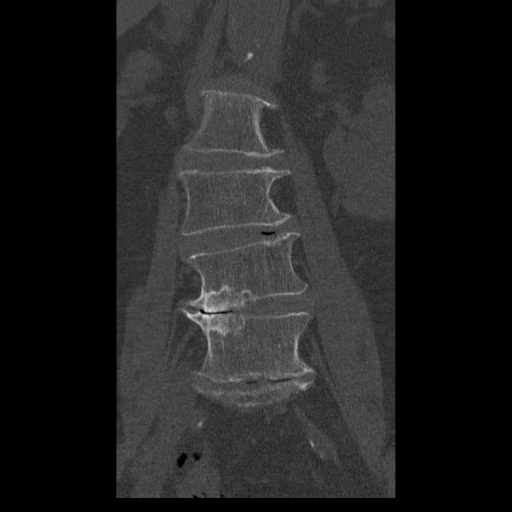
[im 23/58  bone]
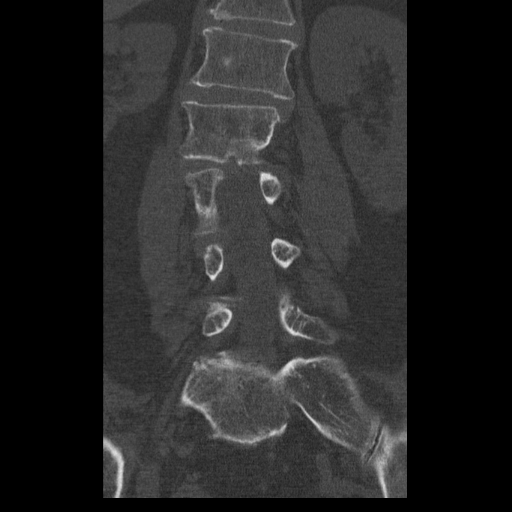
[im 35/58  bone]
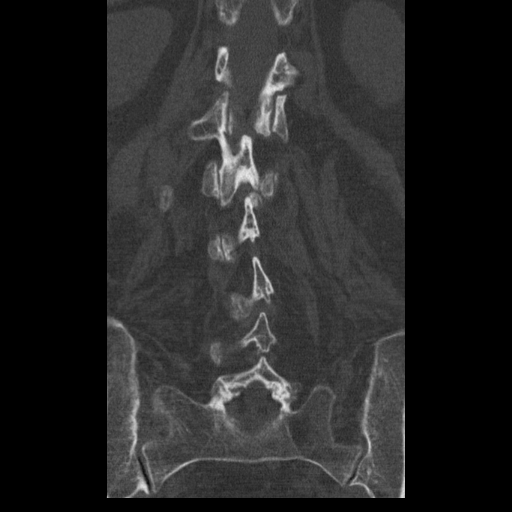

[Series 401: sagittals · sagittal · 0.46mm/px · 5 of 58 slices shown, 6 images]
[im 20/58  bone]
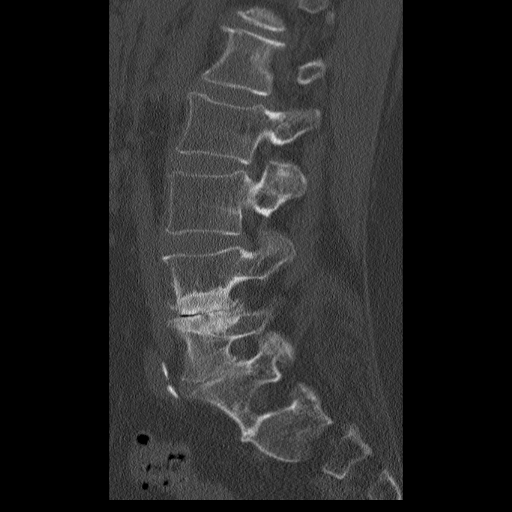
[im 24/58  bone]
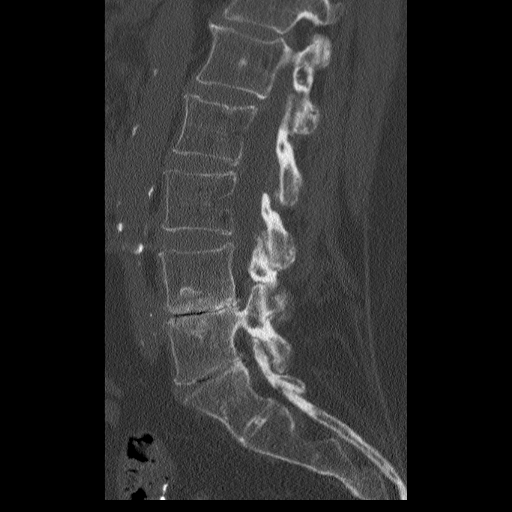
[im 29/58  soft-tissue]
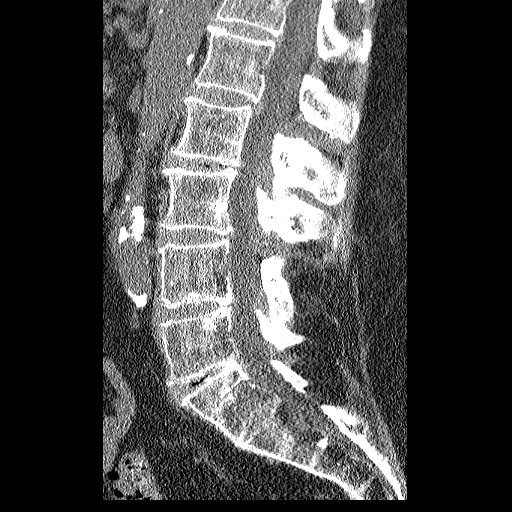
[im 29/58  bone]
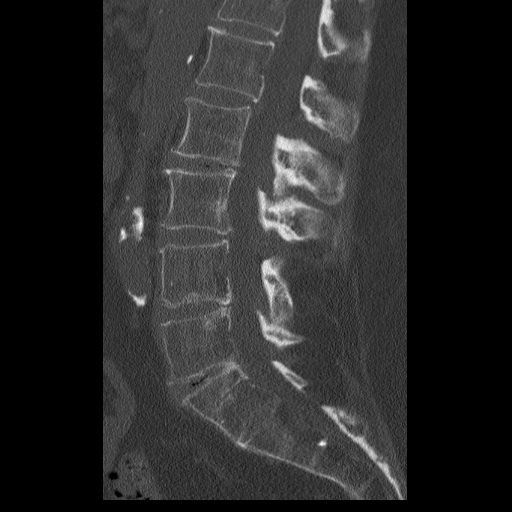
[im 34/58  bone]
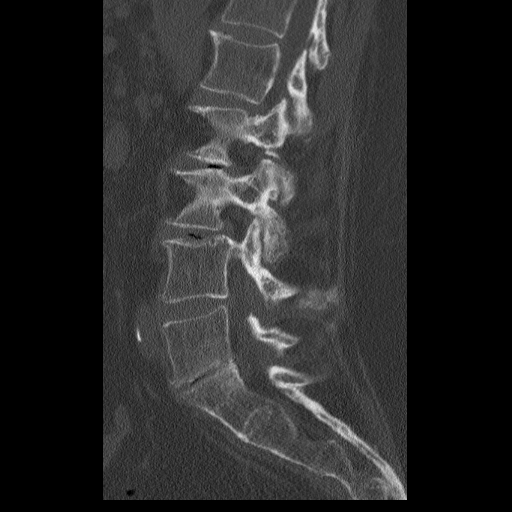
[im 39/58  bone]
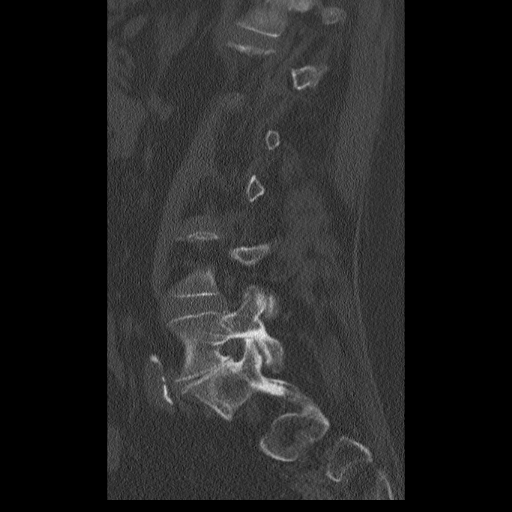

[12 of 33 positions shown; findings below may reference images not displayed]

FINDINGS: The sagittal reformatted images demonstrate normal
overall alignment of the lumbar vertebral bodies.  There is a right
convex lumbar scoliosis.  There is advanced degenerative disc
disease at L4-5 and L5-S1 with disc space narrowing, osteophytic
spurring and discogenic sclerosis.  There are moderate to advanced
facet joint degenerative changes but no pars defects.

L1-2:  Diffuse annular bulge but no significant spinal or foraminal
stenosis.

L2-3:  Diffuse annular bulge and mild osteophytic ridging but no
significant spinal or foraminal stenosis.  Moderate facet disease.

L3-4:  Diffuse bulging annulus and osteophytic ridging without
significant spinal or foraminal stenosis.  There is a far lateral
disc protrusion on the left and osteophytic ridging potentially
irritating the left L3 nerve root extraforaminally.  Moderate facet
disease.

L4-5:  Severe disc disease and facet disease with mild to moderate
spinal and bilateral lateral recess stenosis.  No significant
foraminal stenosis.

L5-S1:  Advanced disc disease and facet disease.  No significant
spinal or lateral recess stenosis.  There is mild foraminal
encroachment bilaterally, right greater than left.
IMPRESSION: 1.  Far left lateral disc protrusion and osteophytic spurring at L3-
4 with potential irritation of the extraforaminal L3 nerve root.
2.  Moderate spinal and bilateral lateral recess stenosis at L4-5.
3.  Advanced disc disease and facet disease at L5-S1 but no
significant spinal or lateral recess stenosis.  Mild foraminal
encroachment bilaterally, right greater than left.

## 2012-06-04 ENCOUNTER — Ambulatory Visit
Admission: RE | Admit: 2012-06-04 | Discharge: 2012-06-04 | Disposition: A | Discharge status: Home / Self Care | Payer: Medicare Other | Source: Ambulatory Visit | Attending: Internal Medicine | Admitting: Internal Medicine

## 2012-06-04 DIAGNOSIS — Z9889 Other specified postprocedural states: Secondary | ICD-10-CM

## 2012-06-04 DIAGNOSIS — Z853 Personal history of malignant neoplasm of breast: Secondary | ICD-10-CM

## 2012-06-07 ENCOUNTER — Other Ambulatory Visit: Payer: Self-pay | Admitting: *Deleted

## 2012-06-07 DIAGNOSIS — C50419 Malignant neoplasm of upper-outer quadrant of unspecified female breast: Secondary | ICD-10-CM

## 2012-06-07 MED ORDER — ANASTROZOLE 1 MG PO TABS: 1.0000 mg | ORAL_TABLET | Freq: Every day | ORAL | Status: DC

## 2012-06-13 ENCOUNTER — Other Ambulatory Visit: Payer: Medicare Other | Admitting: Lab

## 2012-06-13 ENCOUNTER — Ambulatory Visit: Payer: Medicare Other | Admitting: Oncology

## 2012-06-25 ENCOUNTER — Other Ambulatory Visit (HOSPITAL_BASED_OUTPATIENT_CLINIC_OR_DEPARTMENT_OTHER): Payer: Medicare Other | Admitting: Lab

## 2012-06-25 ENCOUNTER — Encounter: Payer: Self-pay | Admitting: Family

## 2012-06-25 ENCOUNTER — Ambulatory Visit (HOSPITAL_BASED_OUTPATIENT_CLINIC_OR_DEPARTMENT_OTHER): Payer: Medicare Other | Admitting: Family

## 2012-06-25 ENCOUNTER — Telehealth: Payer: Self-pay | Admitting: Oncology

## 2012-06-25 VITALS — BP 159/82 | HR 75 | Temp 98.2°F | Resp 20 | Ht 64.0 in | Wt 192.7 lb

## 2012-06-25 DIAGNOSIS — C50419 Malignant neoplasm of upper-outer quadrant of unspecified female breast: Secondary | ICD-10-CM

## 2012-06-25 DIAGNOSIS — E559 Vitamin D deficiency, unspecified: Secondary | ICD-10-CM

## 2012-06-25 DIAGNOSIS — B372 Candidiasis of skin and nail: Secondary | ICD-10-CM

## 2012-06-25 DIAGNOSIS — Z7901 Long term (current) use of anticoagulants: Secondary | ICD-10-CM

## 2012-06-25 DIAGNOSIS — Z170 Estrogen receptor positive status [ER+]: Secondary | ICD-10-CM

## 2012-06-25 DIAGNOSIS — Z853 Personal history of malignant neoplasm of breast: Secondary | ICD-10-CM

## 2012-06-25 LAB — CBC WITH DIFFERENTIAL/PLATELET
Basophils Absolute: 0.1 10*3/uL (ref 0.0–0.1)
EOS%: 3.2 % (ref 0.0–7.0)
HCT: 40.3 % (ref 34.8–46.6)
HGB: 13.6 g/dL (ref 11.6–15.9)
LYMPH%: 29.2 % (ref 14.0–49.7)
MCH: 30.6 pg (ref 25.1–34.0)
MCHC: 33.7 g/dL (ref 31.5–36.0)
MCV: 90.6 fL (ref 79.5–101.0)
MONO%: 9.3 % (ref 0.0–14.0)
NEUT%: 57.5 % (ref 38.4–76.8)

## 2012-06-25 MED ORDER — FLUCONAZOLE 100 MG PO TABS: 100.0000 mg | ORAL_TABLET | Freq: Every day | ORAL | Status: AC

## 2012-06-25 NOTE — Patient Instructions (Addendum)
Discontinue Arimidex.  Restart taking Fosamax.  Ask your primary care about a repeat bone density. You should have one every 2 years.  Mammogram in July was good. You will need another one July 2014.  Take Diflucan once daily for 3 days for the rash under your breasts.

## 2012-06-25 NOTE — Progress Notes (Signed)
[facility]  Name: [name]                  DATE: 06/26/2012 MRN: [id number]                      DOB: [birth year]  REFERRING PHYSICIAN: Katy Apo, MD  DIAGNOSIS: Patient Active Problem List   Diagnosis Date Noted  . Cancer of left breast T1b NX S/P lumpectomy 05/2010, Arimidex, no radiation 06/03/2011     Encounter Diagnosis  Name Primary?  . History of breast cancer Yes    PREVIOUS THERAPY: Left lumpectomy Feb, 2011.   CURRENT THERAPY: Arimidex 1 mg daily.   INTERIM HISTORY: Feels well, on Arimidex with no side effects to report. Mammogram was 06/04/12, no evidence of malignancy. No headache or blurred vision. No cough or shortness of breath. No abdominal pain or new bone pain. Bowel and bladder function are normal. Appetite is good, with adequate fluid intake. Remainder of the 10 point  review of systems is negative.  PHYSICAL EXAM: BP 159/82  Pulse 75  Temp 98.2 F (36.8 C) (Oral)  Resp 20  Ht 5\' 4"  (1.626 m)  Wt 192 lb 11.2 oz (87.408 kg)  BMI 33.08 kg/m2 General: Well developed, well nourished, in no acute distress.  EENT: No ocular or oral lesions. No stomatitis.  Respiratory: Lungs are clear to auscultation bilaterally with normal respiratory movement and no accessory muscle use. Cardiac: No murmur, rub or tachycardia. No upper or lower extremity edema.  GI: Abdomen is soft, no palpable hepatosplenomegaly. No fluid wave. No tenderness. Musculoskeletal: Mild kyphosis, no tenderness over the spine, ribs or hips. Lymph: No cervical, infraclavicular, axillary or inguinal adenopathy. Neuro: No focal neurological deficits. Psych: Alert and oriented X 3, appropriate mood and affect.  BREAST EXAM: In the supine position, with the right arm over the head, the right nipple is everted. No periareolar edema or nipple discharge. No mass in any quadrant or subareolar region. No redness of the skin. No right axillary adenopathy. With the left arm over the  head, the left nipple is everted. No periareolar edema or nipple discharge. No mass in any quadrant or subareolar region. Pacemaker in the left axillary tail. No redness of the skin. No left axillary adenopathy. Breasts are mostly fatty replaced. Beefy red rash, bilateral inframammary folds.   LABORATORY STUDIES:   Results for orders placed in visit on 06/25/12  CBC WITH DIFFERENTIAL      Component Value Range   WBC 6.9  3.9 - 10.3 10e3/uL   NEUT# 4.0  1.5 - 6.5 10e3/uL   HGB 13.6  11.6 - 15.9 g/dL   HCT 14.7  82.9 - 56.2 %   Platelets 256  145 - 400 10e3/uL   MCV 90.6  79.5 - 101.0 fL   MCH 30.6  25.1 - 34.0 pg   MCHC 33.7  31.5 - 36.0 g/dL   RBC 1.30  8.65 - 7.84 10e6/uL   RDW 13.8  11.2 - 14.5 %   lymph# 2.0  0.9 - 3.3 10e3/uL   MONO# 0.6  0.1 - 0.9 10e3/uL   Eosinophils Absolute 0.2  0.0 - 0.5 10e3/uL   Basophils Absolute 0.1  0.0 - 0.1 10e3/uL   NEUT% 57.5  38.4 - 76.8 %   LYMPH% 29.2  14.0 - 49.7 %   MONO% 9.3  0.0 - 14.0 %   EOS% 3.2  0.0 - 7.0 %   BASO%  0.8  0.0 - 2.0 %  COMPREHENSIVE METABOLIC PANEL      Component Value Range   Sodium 139  135 - 145 mEq/L   Potassium 4.3  3.5 - 5.3 mEq/L   Chloride 103  96 - 112 mEq/L   CO2 25  19 - 32 mEq/L   Glucose, Bld 99  70 - 99 mg/dL   BUN 14  6 - 23 mg/dL   Creatinine, Ser 1.61  0.50 - 1.10 mg/dL   Total Bilirubin 0.5  0.3 - 1.2 mg/dL   Alkaline Phosphatase 47  39 - 117 U/L   AST 23  0 - 37 U/L   ALT 17  0 - 35 U/L   Total Protein 7.2  6.0 - 8.3 g/dL   Albumin 4.4  3.5 - 5.2 g/dL   Calcium 9.7  8.4 - 09.6 mg/dL  VITAMIN D 25 HYDROXY      Component Value Range   Vit D, 25-Hydroxy 47  30 - 89 ng/mL    IMPRESSION:  78 y/o female with: 1. History Stage I left breast cancer, 2011, on adjuvant Arimidex with good tolerance.  2. Mammogram 06/04/12, no evidence of malignancy.  3. Candidiasis, inframammary fold of the breasts.   PLAN:   1. Return in 1 year to see Dr. Donnie Coffin. We will alternate visits with Dr. Johna Sheriff per Dr.  Renelda Loma last note.   2. Bone density managed by primary care. 3. Mammo July 2014.  4. Discontinue Arimidex. Restart taking Fosamax.  5. RX for Diflucan 100 mg once daily for 3 days for the

## 2012-06-25 NOTE — Telephone Encounter (Signed)
gve the pt her aug 2014 appt calendar °

## 2012-06-26 LAB — COMPREHENSIVE METABOLIC PANEL
AST: 23 U/L (ref 0–37)
Alkaline Phosphatase: 47 U/L (ref 39–117)
BUN: 14 mg/dL (ref 6–23)
Calcium: 9.7 mg/dL (ref 8.4–10.5)
Creatinine, Ser: 0.71 mg/dL (ref 0.50–1.10)
Total Bilirubin: 0.5 mg/dL (ref 0.3–1.2)

## 2012-06-27 ENCOUNTER — Telehealth: Payer: Self-pay | Admitting: *Deleted

## 2012-06-27 NOTE — Telephone Encounter (Signed)
Diflucan 100mg  pt is to take 1 tab po for three days.. Only 2 tabs had been disp. 1 additional tab has been called to  Pharmacy. 06/27/12

## 2012-07-06 ENCOUNTER — Telehealth: Payer: Self-pay | Admitting: *Deleted

## 2012-07-06 NOTE — Telephone Encounter (Signed)
Rec'd fax requesting refill on Anastrozole 1mg  tabs. Per dictation on 06/26/12 with Colman Cater, patient was to stop Anastrozole and restart Fosamax due to worsening Bone Density results completed by her primary MD, Dr Nehemiah Settle. Called and left message for patient to please return my call for clarification. Requested Bone Density from Dr Nehemiah Settle, last scan was 03/2010. Called and spoke with patient and she requested I speak with her daughter, Evie Lacks. [name] was understanding that Dr Nehemiah Settle was ordering her Bone Density test, however, the [facility] dept, [phone], has not received the referral. Spoke with Colman Cater, and she would like to get patient to have bone density for confirmation results. [name] understands to help her mother get with Dr Lynnette Caffey office and get appts scheduled. [name] will ask to have results sent to [place] to further evaluate patient risk of bone fracture vs. Breast cancer recurrence. [name] will call us once this is done to see if we need to re-start Arimidex.

## 2012-07-10 ENCOUNTER — Other Ambulatory Visit: Payer: Self-pay | Admitting: Oncology

## 2012-07-13 ENCOUNTER — Ambulatory Visit (INDEPENDENT_AMBULATORY_CARE_PROVIDER_SITE_OTHER): Payer: Medicare Other | Admitting: General Surgery

## 2013-01-26 ENCOUNTER — Telehealth: Payer: Self-pay | Admitting: Oncology

## 2013-01-26 NOTE — Telephone Encounter (Signed)
Talkedf to pt and she does not want to come since she is off her meds

## 2013-06-06 ENCOUNTER — Other Ambulatory Visit: Payer: Self-pay | Admitting: Internal Medicine

## 2013-06-06 DIAGNOSIS — K219 Gastro-esophageal reflux disease without esophagitis: Secondary | ICD-10-CM

## 2013-06-11 ENCOUNTER — Other Ambulatory Visit: Payer: Medicare Other

## 2013-06-12 ENCOUNTER — Ambulatory Visit
Admission: RE | Admit: 2013-06-12 | Discharge: 2013-06-12 | Disposition: A | Discharge status: Home / Self Care | Payer: Medicare Other | Source: Ambulatory Visit | Attending: Internal Medicine | Admitting: Internal Medicine

## 2013-06-12 DIAGNOSIS — K219 Gastro-esophageal reflux disease without esophagitis: Secondary | ICD-10-CM

## 2013-06-27 ENCOUNTER — Ambulatory Visit: Payer: Medicare Other | Admitting: Oncology

## 2013-06-27 ENCOUNTER — Ambulatory Visit: Payer: Self-pay | Admitting: Family

## 2013-07-12 ENCOUNTER — Other Ambulatory Visit: Payer: Self-pay

## 2013-07-12 DIAGNOSIS — Z853 Personal history of malignant neoplasm of breast: Secondary | ICD-10-CM

## 2013-07-12 DIAGNOSIS — Z1231 Encounter for screening mammogram for malignant neoplasm of breast: Secondary | ICD-10-CM

## 2013-07-17 ENCOUNTER — Other Ambulatory Visit: Payer: Self-pay | Admitting: Internal Medicine

## 2013-07-17 DIAGNOSIS — Z853 Personal history of malignant neoplasm of breast: Secondary | ICD-10-CM

## 2013-08-05 ENCOUNTER — Ambulatory Visit
Admission: RE | Admit: 2013-08-05 | Discharge: 2013-08-05 | Disposition: A | Discharge status: Home / Self Care | Payer: Medicare Other | Source: Ambulatory Visit

## 2013-08-05 DIAGNOSIS — Z853 Personal history of malignant neoplasm of breast: Secondary | ICD-10-CM

## 2014-07-23 ENCOUNTER — Other Ambulatory Visit: Payer: Self-pay | Admitting: Internal Medicine

## 2014-07-23 DIAGNOSIS — Z853 Personal history of malignant neoplasm of breast: Secondary | ICD-10-CM

## 2014-08-05 ENCOUNTER — Ambulatory Visit
Admission: RE | Admit: 2014-08-05 | Discharge: 2014-08-05 | Disposition: A | Discharge status: Home / Self Care | Payer: Medicare Other | Source: Ambulatory Visit | Attending: Internal Medicine | Admitting: Internal Medicine

## 2014-08-05 DIAGNOSIS — Z853 Personal history of malignant neoplasm of breast: Secondary | ICD-10-CM

## 2015-07-14 DIAGNOSIS — I472 Ventricular tachycardia, unspecified: Secondary | ICD-10-CM | POA: Insufficient documentation

## 2015-07-22 ENCOUNTER — Other Ambulatory Visit: Payer: Self-pay | Admitting: Internal Medicine

## 2015-07-22 DIAGNOSIS — Z9889 Other specified postprocedural states: Secondary | ICD-10-CM

## 2015-07-22 DIAGNOSIS — Z853 Personal history of malignant neoplasm of breast: Secondary | ICD-10-CM

## 2015-08-10 ENCOUNTER — Ambulatory Visit
Admission: RE | Admit: 2015-08-10 | Discharge: 2015-08-10 | Disposition: A | Discharge status: Home / Self Care | Payer: Commercial Managed Care - HMO | Source: Ambulatory Visit | Attending: Internal Medicine | Admitting: Internal Medicine

## 2015-08-10 DIAGNOSIS — Z853 Personal history of malignant neoplasm of breast: Secondary | ICD-10-CM

## 2015-08-10 DIAGNOSIS — Z9889 Other specified postprocedural states: Secondary | ICD-10-CM

## 2015-11-27 DIAGNOSIS — Z1389 Encounter for screening for other disorder: Secondary | ICD-10-CM | POA: Diagnosis not present

## 2015-11-27 DIAGNOSIS — G4700 Insomnia, unspecified: Secondary | ICD-10-CM | POA: Diagnosis not present

## 2015-11-27 DIAGNOSIS — C50912 Malignant neoplasm of unspecified site of left female breast: Secondary | ICD-10-CM | POA: Diagnosis not present

## 2015-11-27 DIAGNOSIS — Z0000 Encounter for general adult medical examination without abnormal findings: Secondary | ICD-10-CM | POA: Diagnosis not present

## 2015-11-27 DIAGNOSIS — M810 Age-related osteoporosis without current pathological fracture: Secondary | ICD-10-CM | POA: Diagnosis not present

## 2015-11-27 DIAGNOSIS — I10 Essential (primary) hypertension: Secondary | ICD-10-CM | POA: Diagnosis not present

## 2015-11-27 DIAGNOSIS — F329 Major depressive disorder, single episode, unspecified: Secondary | ICD-10-CM | POA: Diagnosis not present

## 2015-11-27 DIAGNOSIS — Z95810 Presence of automatic (implantable) cardiac defibrillator: Secondary | ICD-10-CM | POA: Diagnosis not present

## 2015-11-27 DIAGNOSIS — E039 Hypothyroidism, unspecified: Secondary | ICD-10-CM | POA: Diagnosis not present

## 2015-11-27 DIAGNOSIS — I4891 Unspecified atrial fibrillation: Secondary | ICD-10-CM | POA: Diagnosis not present

## 2015-11-27 DIAGNOSIS — E782 Mixed hyperlipidemia: Secondary | ICD-10-CM | POA: Diagnosis not present

## 2016-01-04 DIAGNOSIS — I498 Other specified cardiac arrhythmias: Secondary | ICD-10-CM | POA: Diagnosis not present

## 2016-01-04 DIAGNOSIS — Z4502 Encounter for adjustment and management of automatic implantable cardiac defibrillator: Secondary | ICD-10-CM | POA: Diagnosis not present

## 2016-01-15 DIAGNOSIS — I10 Essential (primary) hypertension: Secondary | ICD-10-CM | POA: Diagnosis not present

## 2016-01-15 DIAGNOSIS — I480 Paroxysmal atrial fibrillation: Secondary | ICD-10-CM | POA: Diagnosis not present

## 2016-01-15 DIAGNOSIS — I472 Ventricular tachycardia: Secondary | ICD-10-CM | POA: Diagnosis not present

## 2016-01-15 DIAGNOSIS — R5383 Other fatigue: Secondary | ICD-10-CM | POA: Diagnosis not present

## 2016-01-15 DIAGNOSIS — Z95810 Presence of automatic (implantable) cardiac defibrillator: Secondary | ICD-10-CM | POA: Diagnosis not present

## 2016-02-02 ENCOUNTER — Ambulatory Visit
Admission: RE | Admit: 2016-02-02 | Discharge: 2016-02-02 | Disposition: A | Discharge status: Home / Self Care | Payer: PPO | Source: Ambulatory Visit | Attending: Nurse Practitioner | Admitting: Nurse Practitioner

## 2016-02-02 ENCOUNTER — Other Ambulatory Visit: Payer: Self-pay | Admitting: Nurse Practitioner

## 2016-02-02 DIAGNOSIS — R0781 Pleurodynia: Secondary | ICD-10-CM | POA: Diagnosis not present

## 2016-02-02 DIAGNOSIS — R079 Chest pain, unspecified: Secondary | ICD-10-CM

## 2016-02-08 DIAGNOSIS — Z01818 Encounter for other preprocedural examination: Secondary | ICD-10-CM | POA: Diagnosis not present

## 2016-02-08 DIAGNOSIS — Z95810 Presence of automatic (implantable) cardiac defibrillator: Secondary | ICD-10-CM | POA: Diagnosis not present

## 2016-02-10 DIAGNOSIS — Z79811 Long term (current) use of aromatase inhibitors: Secondary | ICD-10-CM | POA: Diagnosis not present

## 2016-02-10 DIAGNOSIS — R5383 Other fatigue: Secondary | ICD-10-CM | POA: Diagnosis not present

## 2016-02-10 DIAGNOSIS — Z8249 Family history of ischemic heart disease and other diseases of the circulatory system: Secondary | ICD-10-CM | POA: Diagnosis not present

## 2016-02-10 DIAGNOSIS — K219 Gastro-esophageal reflux disease without esophagitis: Secondary | ICD-10-CM | POA: Diagnosis not present

## 2016-02-10 DIAGNOSIS — Z4502 Encounter for adjustment and management of automatic implantable cardiac defibrillator: Secondary | ICD-10-CM | POA: Insufficient documentation

## 2016-02-10 DIAGNOSIS — Z885 Allergy status to narcotic agent status: Secondary | ICD-10-CM | POA: Diagnosis not present

## 2016-02-10 DIAGNOSIS — I10 Essential (primary) hypertension: Secondary | ICD-10-CM | POA: Diagnosis not present

## 2016-02-10 DIAGNOSIS — I472 Ventricular tachycardia: Secondary | ICD-10-CM | POA: Diagnosis not present

## 2016-02-10 DIAGNOSIS — Z95810 Presence of automatic (implantable) cardiac defibrillator: Secondary | ICD-10-CM | POA: Diagnosis not present

## 2016-02-10 DIAGNOSIS — I480 Paroxysmal atrial fibrillation: Secondary | ICD-10-CM | POA: Diagnosis not present

## 2016-02-10 DIAGNOSIS — Z79899 Other long term (current) drug therapy: Secondary | ICD-10-CM | POA: Diagnosis not present

## 2016-02-25 DIAGNOSIS — Z4502 Encounter for adjustment and management of automatic implantable cardiac defibrillator: Secondary | ICD-10-CM | POA: Diagnosis not present

## 2016-02-25 DIAGNOSIS — I480 Paroxysmal atrial fibrillation: Secondary | ICD-10-CM | POA: Diagnosis not present

## 2016-03-21 DIAGNOSIS — I480 Paroxysmal atrial fibrillation: Secondary | ICD-10-CM | POA: Diagnosis not present

## 2016-03-21 DIAGNOSIS — I10 Essential (primary) hypertension: Secondary | ICD-10-CM | POA: Diagnosis not present

## 2016-03-21 DIAGNOSIS — Z95810 Presence of automatic (implantable) cardiac defibrillator: Secondary | ICD-10-CM | POA: Diagnosis not present

## 2016-03-21 DIAGNOSIS — I472 Ventricular tachycardia: Secondary | ICD-10-CM | POA: Diagnosis not present

## 2016-05-18 DIAGNOSIS — L814 Other melanin hyperpigmentation: Secondary | ICD-10-CM | POA: Diagnosis not present

## 2016-05-18 DIAGNOSIS — L821 Other seborrheic keratosis: Secondary | ICD-10-CM | POA: Diagnosis not present

## 2016-05-18 DIAGNOSIS — L570 Actinic keratosis: Secondary | ICD-10-CM | POA: Diagnosis not present

## 2016-05-18 DIAGNOSIS — D1801 Hemangioma of skin and subcutaneous tissue: Secondary | ICD-10-CM | POA: Diagnosis not present

## 2016-05-18 DIAGNOSIS — Z85828 Personal history of other malignant neoplasm of skin: Secondary | ICD-10-CM | POA: Diagnosis not present

## 2016-05-18 DIAGNOSIS — L820 Inflamed seborrheic keratosis: Secondary | ICD-10-CM | POA: Diagnosis not present

## 2016-05-26 DIAGNOSIS — I4891 Unspecified atrial fibrillation: Secondary | ICD-10-CM | POA: Diagnosis not present

## 2016-05-26 DIAGNOSIS — F339 Major depressive disorder, recurrent, unspecified: Secondary | ICD-10-CM | POA: Diagnosis not present

## 2016-05-26 DIAGNOSIS — M810 Age-related osteoporosis without current pathological fracture: Secondary | ICD-10-CM | POA: Diagnosis not present

## 2016-05-26 DIAGNOSIS — R5383 Other fatigue: Secondary | ICD-10-CM | POA: Diagnosis not present

## 2016-05-26 DIAGNOSIS — R110 Nausea: Secondary | ICD-10-CM | POA: Diagnosis not present

## 2016-05-26 DIAGNOSIS — I10 Essential (primary) hypertension: Secondary | ICD-10-CM | POA: Diagnosis not present

## 2016-05-26 DIAGNOSIS — E7800 Pure hypercholesterolemia, unspecified: Secondary | ICD-10-CM | POA: Diagnosis not present

## 2016-05-26 DIAGNOSIS — E039 Hypothyroidism, unspecified: Secondary | ICD-10-CM | POA: Diagnosis not present

## 2016-06-16 DIAGNOSIS — M1711 Unilateral primary osteoarthritis, right knee: Secondary | ICD-10-CM | POA: Diagnosis not present

## 2016-07-07 DIAGNOSIS — Z95810 Presence of automatic (implantable) cardiac defibrillator: Secondary | ICD-10-CM | POA: Diagnosis not present

## 2016-07-12 ENCOUNTER — Other Ambulatory Visit: Payer: Self-pay | Admitting: Internal Medicine

## 2016-07-12 DIAGNOSIS — Z853 Personal history of malignant neoplasm of breast: Secondary | ICD-10-CM

## 2016-07-21 DIAGNOSIS — M5442 Lumbago with sciatica, left side: Secondary | ICD-10-CM | POA: Diagnosis not present

## 2016-07-21 DIAGNOSIS — M1711 Unilateral primary osteoarthritis, right knee: Secondary | ICD-10-CM | POA: Diagnosis not present

## 2016-07-25 DIAGNOSIS — H35031 Hypertensive retinopathy, right eye: Secondary | ICD-10-CM | POA: Diagnosis not present

## 2016-07-25 DIAGNOSIS — H01001 Unspecified blepharitis right upper eyelid: Secondary | ICD-10-CM | POA: Diagnosis not present

## 2016-07-25 DIAGNOSIS — H04123 Dry eye syndrome of bilateral lacrimal glands: Secondary | ICD-10-CM | POA: Diagnosis not present

## 2016-07-25 DIAGNOSIS — H52203 Unspecified astigmatism, bilateral: Secondary | ICD-10-CM | POA: Diagnosis not present

## 2016-08-12 ENCOUNTER — Ambulatory Visit
Admission: RE | Admit: 2016-08-12 | Discharge: 2016-08-12 | Disposition: A | Discharge status: Home / Self Care | Payer: PPO | Source: Ambulatory Visit | Attending: Internal Medicine | Admitting: Internal Medicine

## 2016-08-12 DIAGNOSIS — R928 Other abnormal and inconclusive findings on diagnostic imaging of breast: Secondary | ICD-10-CM | POA: Diagnosis not present

## 2016-08-12 DIAGNOSIS — Z853 Personal history of malignant neoplasm of breast: Secondary | ICD-10-CM

## 2016-08-15 ENCOUNTER — Encounter (INDEPENDENT_AMBULATORY_CARE_PROVIDER_SITE_OTHER): Payer: PPO | Admitting: Physical Medicine and Rehabilitation

## 2016-08-15 DIAGNOSIS — M5416 Radiculopathy, lumbar region: Secondary | ICD-10-CM | POA: Diagnosis not present

## 2016-10-06 DIAGNOSIS — K219 Gastro-esophageal reflux disease without esophagitis: Secondary | ICD-10-CM | POA: Diagnosis not present

## 2016-10-06 DIAGNOSIS — I10 Essential (primary) hypertension: Secondary | ICD-10-CM | POA: Diagnosis not present

## 2016-10-06 DIAGNOSIS — Z95810 Presence of automatic (implantable) cardiac defibrillator: Secondary | ICD-10-CM | POA: Diagnosis not present

## 2016-10-06 DIAGNOSIS — I472 Ventricular tachycardia: Secondary | ICD-10-CM | POA: Diagnosis not present

## 2016-10-06 DIAGNOSIS — I480 Paroxysmal atrial fibrillation: Secondary | ICD-10-CM | POA: Diagnosis not present

## 2016-10-11 ENCOUNTER — Telehealth (INDEPENDENT_AMBULATORY_CARE_PROVIDER_SITE_OTHER): Payer: Self-pay | Admitting: Physical Medicine and Rehabilitation

## 2016-10-11 NOTE — Telephone Encounter (Signed)
Ok x1 if it helped a good bit, if not may need MRI update, last was 2012

## 2016-10-12 NOTE — Telephone Encounter (Signed)
Faxed auth form to [employer] 

## 2016-10-13 NOTE — Telephone Encounter (Signed)
HTA auth received. [id number] for code [id number]. Good from 10/17/16-04/15/17.

## 2016-10-18 ENCOUNTER — Telehealth (INDEPENDENT_AMBULATORY_CARE_PROVIDER_SITE_OTHER): Payer: Self-pay | Admitting: Physical Medicine and Rehabilitation

## 2016-10-18 MED ORDER — GABAPENTIN 100 MG PO CAPS: ORAL_CAPSULE | ORAL | 1 refills | Status: DC

## 2016-10-18 NOTE — Telephone Encounter (Signed)
rx for 100mg  gabapentin as directed, sent to pharm on record. She should also take tylenol ES or Arthritis 1 cap by mouth three times per day - not PRN

## 2016-10-18 NOTE — Telephone Encounter (Signed)
Called and left message for patient's granddaughter.

## 2016-10-19 NOTE — Telephone Encounter (Signed)
Received ok from Dr. Minna Merritts for pt to d/c Pradaxa. Called Dawn and scheduled pt for 10/2016 @ 3:00 and adv to d/c Pradaxa on 10/21/16.

## 2016-10-26 ENCOUNTER — Ambulatory Visit (INDEPENDENT_AMBULATORY_CARE_PROVIDER_SITE_OTHER): Payer: PPO | Admitting: Physical Medicine and Rehabilitation

## 2016-10-26 ENCOUNTER — Encounter (INDEPENDENT_AMBULATORY_CARE_PROVIDER_SITE_OTHER): Payer: Self-pay | Admitting: Physical Medicine and Rehabilitation

## 2016-10-26 VITALS — BP 133/77 | HR 73

## 2016-10-26 DIAGNOSIS — M5416 Radiculopathy, lumbar region: Secondary | ICD-10-CM | POA: Diagnosis not present

## 2016-10-26 MED ORDER — METHYLPREDNISOLONE ACETATE 80 MG/ML IJ SUSP
80.0000 mg | Freq: Once | INTRAMUSCULAR | Status: AC
  Administered 2016-10-26: 80 mg

## 2016-10-26 MED ORDER — LIDOCAINE HCL (PF) 1 % IJ SOLN: 0.3300 mL | Freq: Once | INTRAMUSCULAR | Status: DC

## 2016-10-26 NOTE — Progress Notes (Signed)
CARESSA HERZBERG - 82 y.o. female MRN [id number]  Date of birth: [birth year]  Office Visit Note: Visit Date: 10/26/2016 PCP: Kandice Hams, MD Referred by: Seward Carol, MD  Subjective: Chief Complaint  Patient presents with  . Lower Back - Pain   HPI: Mrs. Drzewiecki is a very pleasant 82 year old female that I have seen him a couple of occasions and has gotten really good relief with L5 transforaminal injection. Unfortunately back in October we completed the injection shelling out relief for a few days to weeks. The symptoms started to return and she decided really just put up with it that point that she felt like she didn't know when to return. She did discuss this with her granddaughter who is present today and he does help her quite a bit about coming back in for an injection. They did realize it was a problem with the blood thinner so that is all been worked out and she has been off her Pradaxa now prior to the injection. .Patient states she had some relief with last injection for a couple of weeks. Right side. Down leg to foot. Her symptoms are not classic L5 distribution. I did review the fluoroscopic images from the last injection compared to the prior. I feel like: I did look fairly well with good flow of contrast it may have been somewhat dorsal and around the pedicle is available under the pedicle with good flow.    ROS Otherwise per HPI.  Assessment & Plan: Visit Diagnoses:  1. Lumbar radiculopathy     Plan: Findings:  Repeat right L5 transforaminal epidural injection for classic right L5 radicular pain and level of lateral recess stenosis. Good relief in the past. Good relief with last injection more than 70% but only for a couple weeks. Review of imaging shows probably need to be a better well-placed injection.    Meds & Orders:  Meds ordered this encounter  Medications  . lidocaine (PF) (XYLOCAINE) 1 % injection 0.3 mL  . methylPREDNISolone acetate (DEPO-MEDROL) injection  80 mg    Orders Placed This Encounter  Procedures  . Epidural Steroid injection    Follow-up: No Follow-up on file.   Procedures: No procedures performed  Lumbosacral Transforaminal Epidural Steroid Injection - Infraneural Approach with Fluoroscopic Guidance  Patient: KINGSTON MCCRITE      Date of Birth: Mar 30, 1926 MRN: [id number] PCP: Kandice Hams, MD      Visit Date: 10/26/2016   Universal Protocol:    Date/Time: 12/21/176:23 AM  Consent Given By: the patient  Position: PRONE   Additional Comments: Vital signs were monitored before and after the procedure. Patient was prepped and draped in the usual sterile fashion. The correct patient, procedure, and site was verified.   Injection Procedure Details:  Procedure Site One Meds Administered:  Meds ordered this encounter  Medications  . lidocaine (PF) (XYLOCAINE) 1 % injection 0.3 mL  . methylPREDNISolone acetate (DEPO-MEDROL) injection 80 mg      Laterality: Right  Location/Site:  L5-S1  Needle size: 22 G  Needle type: Spinal  Needle Placement: Transforaminal  Findings:  -Contrast Used: 1 mL iohexol 180 mg iodine/mL   -Comments: Excellent flow of contrast along the nerve and into the epidural space.  Procedure Details: After squaring off the end-plates of the desired vertebral level to get a true AP view, the C-arm was obliqued to the painful side so that the superior articulating process is positioned about 1/3 the length of the inferior endplate.  The  needle was aimed toward the junction of the superior articular process and the transverse process of the inferior vertebrae. The needle's initial entry is in the lower third of the foramen through Kambin's triangle. The soft tissues overlying this target were infiltrated with 2-3 ml. of 1% Lidocaine without Epinephrine.  The spinal needle was then inserted and advanced toward the target using a "trajectory" view along the fluoroscope beam.  Under AP and lateral  visualization, the needle was advanced so it did not puncture dura and did not traverse medially beyond the 6 o'clock position of the pedicle. Bi-planar projections were used to confirm position. Aspiration was confirmed to be negative for CSF and/or blood. A 1-2 ml. volume of Isovue-250 was injected and flow of contrast was noted at each level. Radiographs were obtained for documentation purposes.   After attaining the desired flow of contrast documented above, a 0.5 to 1.0 ml test dose of 0.25% Marcaine was injected into each respective transforaminal space.  The patient was observed for 90 seconds post injection.  After no sensory deficits were reported, and normal lower extremity motor function was noted,   the above injectate was administered so that equal amounts of the injectate were placed at each foramen (level) into the transforaminal epidural space.   Additional Comments:  The patient tolerated the procedure well Dressing: Band-Aid    Post-procedure details: Patient was observed during the procedure. Post-procedure instructions were reviewed.  Patient left the clinic in stable condition.     Clinical History: No specialty comments available.  She reports that she has never smoked. She has never used smokeless tobacco. No results for input(s): HGBA1C, LABURIC in the last 8760 hours.  Objective:  VS:  HT:    WT:   BMI:     BP:133/77  HR:73bpm  TEMP: ( )  RESP:97 % Physical Exam  Musculoskeletal:  Patient ambulates with a cane. She has a forward flexed spine. She has some mild decreased sensation to light touch in an L5 dermatome on the right. She has no clonus bilaterally and good strength.    Ortho Exam Imaging: No results found.  Past Medical/Family/Surgical/Social History: Medications & Allergies reviewed per EMR Patient Active Problem List   Diagnosis Date Noted  . Cancer of left breast T1b NX S/P lumpectomy 05/2010, Arimidex, no radiation 06/03/2011   Past  Medical History:  Diagnosis Date  . Arthritis   . Cancer ([place])   . Hyperlipidemia   . Hypertension   . Pacemaker 2010   [place]  . Pelvic fracture ([place]) 2010  . Thyroid disease    Family History  Problem Relation Age of Onset  . Cancer Sister     lung  . Cancer Brother     liver  . Heart disease Mother   . Diabetes Father   . Stroke Father   . Pancreatitis Brother    Past Surgical History:  Procedure Laterality Date  . ABDOMINAL HYSTERECTOMY  35 yrs ago  . BREAST LUMPECTOMY  05/23/10   left  . CARDIAC DEFIBRILLATOR PLACEMENT  2 years ago  . CHOLECYSTECTOMY  50 YEARS AGO  . ELBOW SURGERY  2006   Social History   Occupational History  . Not on file.   Social History Main Topics  . Smoking status: Never Smoker  . Smokeless tobacco: Never Used  . Alcohol use No  . Drug use: Unknown  . Sexual activity: Not on file

## 2016-10-26 NOTE — Patient Instructions (Signed)

## 2016-10-27 NOTE — Procedures (Signed)
Lumbosacral Transforaminal Epidural Steroid Injection - Infraneural Approach with Fluoroscopic Guidance  Patient: [name]      Date of Birth: 26-Jan-1926 MRN: [id number] PCP: Kandice Hams, MD      Visit Date: 10/26/2016   Universal Protocol:    Date/Time: 12/21/176:23 AM  Consent Given By: the patient  Position: PRONE   Additional Comments: Vital signs were monitored before and after the procedure. Patient was prepped and draped in the usual sterile fashion. The correct patient, procedure, and site was verified.   Injection Procedure Details:  Procedure Site One Meds Administered:  Meds ordered this encounter  Medications  . lidocaine (PF) (XYLOCAINE) 1 % injection 0.3 mL  . methylPREDNISolone acetate (DEPO-MEDROL) injection 80 mg      Laterality: Right  Location/Site:  L5-S1  Needle size: 22 G  Needle type: Spinal  Needle Placement: Transforaminal  Findings:  -Contrast Used: 1 mL iohexol 180 mg iodine/mL   -Comments: Excellent flow of contrast along the nerve and into the epidural space.  Procedure Details: After squaring off the end-plates of the desired vertebral level to get a true AP view, the C-arm was obliqued to the painful side so that the superior articulating process is positioned about 1/3 the length of the inferior endplate.  The needle was aimed toward the junction of the superior articular process and the transverse process of the inferior vertebrae. The needle's initial entry is in the lower third of the foramen through Kambin's triangle. The soft tissues overlying this target were infiltrated with 2-3 ml. of 1% Lidocaine without Epinephrine.  The spinal needle was then inserted and advanced toward the target using a "trajectory" view along the fluoroscope beam.  Under AP and lateral visualization, the needle was advanced so it did not puncture dura and did not traverse medially beyond the 6 o'clock position of the pedicle. Bi-planar projections  were used to confirm position. Aspiration was confirmed to be negative for CSF and/or blood. A 1-2 ml. volume of Isovue-250 was injected and flow of contrast was noted at each level. Radiographs were obtained for documentation purposes.   After attaining the desired flow of contrast documented above, a 0.5 to 1.0 ml test dose of 0.25% Marcaine was injected into each respective transforaminal space.  The patient was observed for 90 seconds post injection.  After no sensory deficits were reported, and normal lower extremity motor function was noted,   the above injectate was administered so that equal amounts of the injectate were placed at each foramen (level) into the transforaminal epidural space.   Additional Comments:  The patient tolerated the procedure well Dressing: Band-Aid    Post-procedure details: Patient was observed during the procedure. Post-procedure instructions were reviewed.  Patient left the clinic in stable condition.

## 2016-11-14 ENCOUNTER — Telehealth (INDEPENDENT_AMBULATORY_CARE_PROVIDER_SITE_OTHER): Payer: Self-pay | Admitting: Physical Medicine and Rehabilitation

## 2016-11-14 DIAGNOSIS — M5416 Radiculopathy, lumbar region: Secondary | ICD-10-CM

## 2016-11-14 NOTE — Telephone Encounter (Signed)
Placed order for CT Lumbar without- will need cosign.

## 2016-11-14 NOTE — Telephone Encounter (Signed)
I did cosaign. The last message should read Pradaxa and not Robaxin, am sure you figured that out

## 2016-11-14 NOTE — Telephone Encounter (Signed)
If the pain is still going from the low back down the leg in the go ahead and try to get approval to get off the Robaxin once again and we would try an L5-S1 interlaminar epidural injection. Also while were waiting on this I would like to try and see what to get her a fast CT scan of the lumbar spine. I think those can be done fairly quickly. It would be nice to have that done before we did the injection. Her last CT scan of the lumbar spine was done in 2012. She has an AICD

## 2016-11-15 NOTE — Telephone Encounter (Signed)
Faxed auth form with last 2 office notes to [facility] [employer] [phone]

## 2016-11-16 NOTE — Telephone Encounter (Signed)
Received auth to d/c Pradaxa 5 days prior from Dr. Minna Merritts. Still waiting on ins auth

## 2016-11-25 ENCOUNTER — Ambulatory Visit
Admission: RE | Admit: 2016-11-25 | Discharge: 2016-11-25 | Disposition: A | Discharge status: Home / Self Care | Payer: PPO | Source: Ambulatory Visit | Attending: Physical Medicine and Rehabilitation | Admitting: Physical Medicine and Rehabilitation

## 2016-11-25 DIAGNOSIS — M5416 Radiculopathy, lumbar region: Secondary | ICD-10-CM

## 2016-11-25 DIAGNOSIS — M5126 Other intervertebral disc displacement, lumbar region: Secondary | ICD-10-CM | POA: Diagnosis not present

## 2016-11-25 NOTE — Telephone Encounter (Signed)
Still pending per website 

## 2016-11-28 NOTE — Progress Notes (Signed)
She will need a right L5-S1 intralaminar injection. She has helped to manage and she is on Pradaxa. Alternatively we could look at a sacroiliac joint injection as there is significant degenerative change of the joints bilaterally on the CT scan. Obviously that would not require [place] to take her off of her blood thinner. The epidural is the right approach first.

## 2016-11-29 NOTE — Telephone Encounter (Signed)
Needs CT review at same time.

## 2016-11-29 NOTE — Telephone Encounter (Signed)
Received HTA [id number]. S/w Dawn (grandaughter)and Pt scheduled for 12/05/16 @ 3:00 w/driver and knows to d/c Pradaxa tomorrow.

## 2016-12-05 ENCOUNTER — Ambulatory Visit (INDEPENDENT_AMBULATORY_CARE_PROVIDER_SITE_OTHER): Payer: PPO | Admitting: Physical Medicine and Rehabilitation

## 2016-12-05 ENCOUNTER — Ambulatory Visit (INDEPENDENT_AMBULATORY_CARE_PROVIDER_SITE_OTHER): Payer: Self-pay

## 2016-12-05 ENCOUNTER — Encounter (INDEPENDENT_AMBULATORY_CARE_PROVIDER_SITE_OTHER): Payer: Self-pay | Admitting: Physical Medicine and Rehabilitation

## 2016-12-05 VITALS — BP 137/69 | HR 69 | Temp 98.0°F

## 2016-12-05 DIAGNOSIS — M5416 Radiculopathy, lumbar region: Secondary | ICD-10-CM

## 2016-12-05 MED ORDER — LIDOCAINE HCL (PF) 1 % IJ SOLN
0.3300 mL | Freq: Once | INTRAMUSCULAR | Status: AC
  Administered 2016-12-05: 0.3 mL

## 2016-12-05 MED ORDER — METHYLPREDNISOLONE ACETATE 80 MG/ML IJ SUSP
80.0000 mg | Freq: Once | INTRAMUSCULAR | Status: AC
  Administered 2016-12-05: 80 mg

## 2016-12-05 NOTE — Patient Instructions (Signed)

## 2016-12-05 NOTE — Progress Notes (Addendum)
IFE CRAPSER - 83 y.o. female MRN [id number]  Date of birth: 06-11-1926  Office Visit Note: Visit Date: 12/05/2016 PCP: Kandice Hams, MD Referred by: Seward Carol, MD  Subjective: Chief Complaint  Patient presents with  . Lower Back - Pain   HPI: [name] is an 83 year old female that we recently saw and completed a right L5 transforaminal epidural steroid injection . Says it took a few days before she felt any relief with last injection and then just minimal relief. Right side and radiating down leg to calf. Worse with walking and standing. Some relief with sitting. We did update CT scan. She cannot have MRI due to pacemaker. CT scan was really unrevealing shows fairly similar anatomic pathology as before. She does have some sacroiliac joint degenerative changes of the worsen but her symptoms are not really consistent with that. I still think this is an L5 radicular pain from lateral recess stenosis at L4-5. We are going to complete a right L5-S1 intralaminar injections into the transforaminal injection to see if that does better.  Sopped taking Pradaxa last Weds.    ROS Otherwise per HPI.  Assessment & Plan: Visit Diagnoses:  1. Lumbar radiculopathy     Plan: Findings:  Right L5-S1 interlaminar epidural steroid injection.    Meds & Orders:  Meds ordered this encounter  Medications  . lidocaine (PF) (XYLOCAINE) 1 % injection 0.3 mL  . methylPREDNISolone acetate (DEPO-MEDROL) injection 80 mg    Orders Placed This Encounter  Procedures  . XR C-ARM NO REPORT  . Epidural Steroid injection    Follow-up: Return if symptoms worsen or fail to improve, 2 weeks.   Procedures: No procedures performed  Lumbar Epidural Steroid Injection - Interlaminar Approach with Fluoroscopic Guidance  Patient: [name]      Date of Birth: 01-11-1926 MRN: [id number] PCP: Kandice Hams, MD      Visit Date: 12/05/2016   Universal Protocol:    Date/Time: 01/31/185:08  AM  Consent Given By: the patient  Position: PRONE  Additional Comments: Vital signs were monitored before and after the procedure. Patient was prepped and draped in the usual sterile fashion. The correct patient, procedure, and site was verified.   Injection Procedure Details:  Procedure Site One Meds Administered:  Meds ordered this encounter  Medications  . lidocaine (PF) (XYLOCAINE) 1 % injection 0.3 mL  . methylPREDNISolone acetate (DEPO-MEDROL) injection 80 mg     Laterality: Right  Location/Site:  L5-S1  Needle size: 20 G  Needle type: Tuohy  Needle Placement: Paramedian epidural  Findings:  -Contrast Used: 1 mL iohexol 180 mg iodine/mL   -Comments: Excellent flow of contrast into the epidural space.  Procedure Details: Using a paramedian approach from the side mentioned above, the region overlying the inferior lamina was localized under fluoroscopic visualization and the soft tissues overlying this structure were infiltrated with 4 ml. of 1% Lidocaine without Epinephrine. The Tuohy needle was inserted into the epidural space using a paramedian approach.   The epidural space was localized using loss of resistance along with lateral and bi-planar fluoroscopic views.  After negative aspirate for air, blood, and CSF, a 2 ml. volume of Isovue-250 was injected into the epidural space and the flow of contrast was observed. Radiographs were obtained for documentation purposes.    The injectate was administered into the level noted above.   Additional Comments:  The patient tolerated the procedure well Dressing: Band-Aid    Post-procedure details: Patient was observed during  the procedure. Post-procedure instructions were reviewed.  Patient left the clinic in stable condition.    Clinical History: IMPRESSION: Chronic curvature convex to the right.  L1-2 shows 2 mm retrolisthesis and shallow protrusion of the disc with mild stenosis of both lateral  recesses. No apparent change.  Left-sided predominant degenerative disc disease and degenerative facet disease at L2-3 and L3-4 without definite neural compression.  Right-sided predominant degenerative disc disease at L4-5 with mild narrowing of the right lateral recess and intervertebral foramen that could be symptomatic. Very similar appearance to the previous study however.  Symmetric degenerative disease at L5-S1 without apparent neural compression.  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   Electronically Signed   By: Nelson Chimes M.D.   On: 11/25/2016 15:38  She reports that she has never smoked. She has never used smokeless tobacco. No results for input(s): HGBA1C, LABURIC in the last 8760 hours.  Objective:  VS:  HT:    WT:   BMI:     BP:137/69  HR:69bpm  TEMP:98 F (36.7 C)(Oral)  RESP:98 % Physical Exam  Constitutional: She is oriented to person, place, and time.  Musculoskeletal:  She ambulates with a cane. His forward flexed spine good distal strength.  Neurological: She is alert and oriented to person, place, and time. She exhibits normal muscle tone. Coordination normal.    Ortho Exam Imaging: No results found.  Past Medical/Family/Surgical/Social History: Medications & Allergies reviewed per EMR Patient Active Problem List   Diagnosis Date Noted  . Cancer of left breast T1b NX S/P lumpectomy 05/2010, Arimidex, no radiation 06/03/2011   Past Medical History:  Diagnosis Date  . Arthritis   . Cancer ([place])   . Hyperlipidemia   . Hypertension   . Pacemaker 2010   [place]  . Pelvic fracture ([place]) 2010  . Thyroid disease    Family History  Problem Relation Age of Onset  . Cancer Sister     lung  . Cancer Brother     liver  . Heart disease Mother   . Diabetes Father   . Stroke Father   . Pancreatitis Brother    Past Surgical History:  Procedure Laterality Date  . ABDOMINAL HYSTERECTOMY  35  yrs ago  . BREAST LUMPECTOMY  05/23/10   left  . CARDIAC DEFIBRILLATOR PLACEMENT  2 years ago  . CHOLECYSTECTOMY  50 YEARS AGO  . ELBOW SURGERY  2006   Social History   Occupational History  . Not on file.   Social History Main Topics  . Smoking status: Never Smoker  . Smokeless tobacco: Never Used  . Alcohol use No  . Drug use: Unknown  . Sexual activity: Not on file

## 2016-12-07 NOTE — Procedures (Signed)
Lumbar Epidural Steroid Injection - Interlaminar Approach with Fluoroscopic Guidance  Patient: [name]      Date of Birth: 18-Sep-1926 MRN: [id number] PCP: Kandice Hams, MD      Visit Date: 12/05/2016   Universal Protocol:    Date/Time: 01/31/185:08 AM  Consent Given By: the patient  Position: PRONE  Additional Comments: Vital signs were monitored before and after the procedure. Patient was prepped and draped in the usual sterile fashion. The correct patient, procedure, and site was verified.   Injection Procedure Details:  Procedure Site One Meds Administered:  Meds ordered this encounter  Medications  . lidocaine (PF) (XYLOCAINE) 1 % injection 0.3 mL  . methylPREDNISolone acetate (DEPO-MEDROL) injection 80 mg     Laterality: Right  Location/Site:  L5-S1  Needle size: 20 G  Needle type: Tuohy  Needle Placement: Paramedian epidural  Findings:  -Contrast Used: 1 mL iohexol 180 mg iodine/mL   -Comments: Excellent flow of contrast into the epidural space.  Procedure Details: Using a paramedian approach from the side mentioned above, the region overlying the inferior lamina was localized under fluoroscopic visualization and the soft tissues overlying this structure were infiltrated with 4 ml. of 1% Lidocaine without Epinephrine. The Tuohy needle was inserted into the epidural space using a paramedian approach.   The epidural space was localized using loss of resistance along with lateral and bi-planar fluoroscopic views.  After negative aspirate for air, blood, and CSF, a 2 ml. volume of Isovue-250 was injected into the epidural space and the flow of contrast was observed. Radiographs were obtained for documentation purposes.    The injectate was administered into the level noted above.   Additional Comments:  The patient tolerated the procedure well Dressing: Band-Aid    Post-procedure details: Patient was observed during the procedure. Post-procedure  instructions were reviewed.  Patient left the clinic in stable condition.

## 2016-12-09 ENCOUNTER — Other Ambulatory Visit (INDEPENDENT_AMBULATORY_CARE_PROVIDER_SITE_OTHER): Payer: Self-pay | Admitting: Physical Medicine and Rehabilitation

## 2016-12-13 DIAGNOSIS — I10 Essential (primary) hypertension: Secondary | ICD-10-CM | POA: Diagnosis not present

## 2016-12-13 DIAGNOSIS — G4700 Insomnia, unspecified: Secondary | ICD-10-CM | POA: Diagnosis not present

## 2016-12-13 DIAGNOSIS — Z95810 Presence of automatic (implantable) cardiac defibrillator: Secondary | ICD-10-CM | POA: Diagnosis not present

## 2016-12-13 DIAGNOSIS — M810 Age-related osteoporosis without current pathological fracture: Secondary | ICD-10-CM | POA: Diagnosis not present

## 2016-12-13 DIAGNOSIS — Z1389 Encounter for screening for other disorder: Secondary | ICD-10-CM | POA: Diagnosis not present

## 2016-12-13 DIAGNOSIS — Z0000 Encounter for general adult medical examination without abnormal findings: Secondary | ICD-10-CM | POA: Diagnosis not present

## 2016-12-13 DIAGNOSIS — C50912 Malignant neoplasm of unspecified site of left female breast: Secondary | ICD-10-CM | POA: Diagnosis not present

## 2016-12-13 DIAGNOSIS — Z23 Encounter for immunization: Secondary | ICD-10-CM | POA: Diagnosis not present

## 2016-12-13 DIAGNOSIS — E039 Hypothyroidism, unspecified: Secondary | ICD-10-CM | POA: Diagnosis not present

## 2016-12-13 DIAGNOSIS — E663 Overweight: Secondary | ICD-10-CM | POA: Diagnosis not present

## 2016-12-13 DIAGNOSIS — Z6833 Body mass index (BMI) 33.0-33.9, adult: Secondary | ICD-10-CM | POA: Diagnosis not present

## 2016-12-13 DIAGNOSIS — E7800 Pure hypercholesterolemia, unspecified: Secondary | ICD-10-CM | POA: Diagnosis not present

## 2016-12-13 DIAGNOSIS — I4891 Unspecified atrial fibrillation: Secondary | ICD-10-CM | POA: Diagnosis not present

## 2016-12-15 DIAGNOSIS — R739 Hyperglycemia, unspecified: Secondary | ICD-10-CM | POA: Diagnosis not present

## 2016-12-26 ENCOUNTER — Telehealth (INDEPENDENT_AMBULATORY_CARE_PROVIDER_SITE_OTHER): Payer: Self-pay | Admitting: Physical Medicine and Rehabilitation

## 2016-12-28 NOTE — Telephone Encounter (Signed)
Scheduled for an OV 2/22 at 1500.

## 2016-12-28 NOTE — Telephone Encounter (Signed)
Ov to discuss options

## 2016-12-29 ENCOUNTER — Telehealth (INDEPENDENT_AMBULATORY_CARE_PROVIDER_SITE_OTHER): Payer: Self-pay | Admitting: Physical Medicine and Rehabilitation

## 2016-12-29 ENCOUNTER — Ambulatory Visit (INDEPENDENT_AMBULATORY_CARE_PROVIDER_SITE_OTHER): Payer: PPO | Admitting: Physical Medicine and Rehabilitation

## 2016-12-29 ENCOUNTER — Encounter (INDEPENDENT_AMBULATORY_CARE_PROVIDER_SITE_OTHER): Payer: Self-pay | Admitting: Physical Medicine and Rehabilitation

## 2016-12-29 VITALS — BP 149/84 | HR 75

## 2016-12-29 DIAGNOSIS — G8929 Other chronic pain: Secondary | ICD-10-CM | POA: Diagnosis not present

## 2016-12-29 DIAGNOSIS — M5441 Lumbago with sciatica, right side: Secondary | ICD-10-CM | POA: Diagnosis not present

## 2016-12-29 DIAGNOSIS — F411 Generalized anxiety disorder: Secondary | ICD-10-CM

## 2016-12-29 DIAGNOSIS — M5416 Radiculopathy, lumbar region: Secondary | ICD-10-CM | POA: Diagnosis not present

## 2016-12-29 DIAGNOSIS — M47816 Spondylosis without myelopathy or radiculopathy, lumbar region: Secondary | ICD-10-CM

## 2016-12-29 NOTE — Patient Instructions (Signed)
Tylenol 500mg  or 650mg   1 capsule three times per day (every 8hrs), continue Gabapentin as taking currently.    Spinal Stenosis Spinal stenosis is an abnormal narrowing of the canals of your spine (vertebrae). CAUSES  Spinal stenosis is caused by areas of bone pushing into the central canals of your vertebrae. This condition can be present at birth (congenital). It also may be caused by arthritic deterioration of your vertebrae (spinal degeneration).  SYMPTOMS   Pain that is generally worse with activities, particularly standing and walking.  Numbness, tingling, hot or cold sensations, weakness, or weariness in your legs.  Frequent episodes of falling.  A foot-slapping gait that leads to muscle weakness. DIAGNOSIS  Spinal stenosis is diagnosed with the use of magnetic resonance imaging (MRI) or computed tomography (CT). TREATMENT  Initial therapy for spinal stenosis focuses on the management of the pain and other symptoms associated with the condition. These therapies include:  Practicing postural changes to lessen pressure on your nerves.  Exercises to strengthen the core of your body.  Loss of excess body weight.  The use of nonsteroidal anti-inflammatory medicines to reduce swelling and inflammation in your nerves. When therapies to manage pain are not successful, surgery to treat spinal stenosis may be recommended. This surgery involves removing excess bone, which puts pressure on your nerve roots. During this surgery (laminectomy), the posterior boney arch (lamina) and excess bone around the facet joints are removed. This information is not intended to replace advice given to you by your health care provider. Make sure you discuss any questions you have with your health care provider. Document Released: 01/14/2004 Document Revised: 11/14/2014 Document Reviewed: 02/01/2013 Elsevier Interactive Patient Education  2017 [employer].

## 2016-12-29 NOTE — Progress Notes (Signed)
[name] - 83 y.o. female MRN [id number]  Date of birth: 06/02/1926  Office Visit Note: Visit Date: 12/29/2016 PCP: Kandice Hams, MD Referred by: Seward Carol, MD  Subjective: Chief Complaint  Patient presents with  . Lower Back - Pain   HPI: [name] is a very pleasant but anxious 83 year old female who is somewhat hard of hearing. She usually comes with her granddaughter but is here today with her daughter and another relative. The other relative that is with her has a history of multiple sclerosis and low back pain and prior lumbar fusion. She does provide some of the history but unfortunately makes the entire process a little bit difficult from the length of time standpoint. The patient has low back pain worse with standing and ambulating better sitting. She has pain that radiates down the leg. She reports the leg pain is the worst part of all of it. She has a hard time describing the pain itself but is very sharp and shooting. It will radiate down the anterior lateral leg and mostly in L5 distribution but somewhat L4. This is something that she has had in the past and has been something that L5 transforaminal injections have basically at least to a degree helped her over the last year or 2. Unfortunately the last L5 transforaminal injection that we performed did help but didn't last very long. We then went on to complete a CT scan of her lumbar spine. She has significant heart history with the use of Prozac site as well as having cardiac pacemaker and defibrillator. We did get a CT scan just to see if there have been any changes and really there've been not much in the way of changes from prior scan. She has not had a recent myelogram. This did show some degenerative disc disease predominantly on the right and did show what would appear to be lateral recess narrowing at L4-5 which could account for the L5 symptoms. She clearly does have facet arthritis as well. This is been  discussed in the past and is discussed today at length with the relative as well as the daughter. The patient doesn't take much in the way of medications and we have a long discussion on different medications today. We probably spoke for 20 minutes along just on medication tight management. The patient really just wants her pain to be better and is very frustrated at this point. She does perseverate over cause-and-effect of why she is hurting. She also repeats a lot of issues that we have spoken 11 the past and probably are not causing her problems. She denies any focal weakness or bowel or bladder difficulty. Again intralaminar epidural steroid injection on 12/05/2016 did not give her any relief at all. The relatives in the room today are asking about medications for arthritis.   Review of Systems  Constitutional: Negative for chills, fever, malaise/fatigue and weight loss.  HENT: Positive for hearing loss. Negative for sinus pain.   Eyes: Negative for blurred vision, double vision and photophobia.  Respiratory: Negative for cough and shortness of breath.   Cardiovascular: Negative for chest pain, palpitations and leg swelling.  Gastrointestinal: Negative for abdominal pain, nausea and vomiting.  Genitourinary: Negative for flank pain.  Musculoskeletal: Positive for back pain and joint pain. Negative for myalgias.  Skin: Negative for itching and rash.  Neurological: Negative for tremors, focal weakness and weakness.  Endo/Heme/Allergies: Negative.   Psychiatric/Behavioral: Negative for depression.  All other systems reviewed and are negative.  Otherwise per HPI.  Assessment & Plan: Visit Diagnoses:  1. Lumbar radiculopathy   2. Spondylosis without myelopathy or radiculopathy, lumbar region   3. Chronic right-sided low back pain with right-sided sciatica   4. Generalized anxiety disorder     Plan: Findings:  Chronic worsening severe at times predominantly right radicular leg pain down to  the calf in an L5 distribution. Could be more of an L4 distribution but seems to be L5 related predominantly. She also has pain consistent with facet arthropathy and spondylosis. Her pain is worse with standing and ambulating better at rest. She has CT scans showing multilevel facet arthropathy with some mild listhesis. There is right side predominant degenerative changes at the L4-5 level as well as facet arthropathy that could be symptomatic. She has lateral recess narrowing here that could give her an L5 radicular pain. This is all very similar to prior study without really much change. We've gone over this again at length with her daughter and another relative. I spent more than 40 minutes speaking face-to-face with the patient with 50% of the time in counseling. At this point it would be wise for them to speak to their primary care physician in terms of medication management. I think she would do well with scheduled Tylenol one to 2 capsules 3 times a day scheduled. She is taking very little in the way of pain medication at this point. She is not a great candidate for opiate management although a mild bit of this may be beneficial but I think that can be managed by the primary care physician. She is someone on multiple medications including a lot of heart and cardiac medications. I think the next step 2 is to complete one more transforaminal epidural injection as they have helped in the past. She does have foraminal narrowing and scoliosis and I think if we can get the injection and then placed well maybe she'll do pretty good with her leg pain and numbness all be a moot point. She would likely do well regrouping with the physical therapist. All this was discussed today.    Meds & Orders: No orders of the defined types were placed in this encounter.  No orders of the defined types were placed in this encounter.   Follow-up: Return for right L5 Tranforaminal off Pradaxa.   Procedures: No procedures  performed  No notes on file   Clinical History: IMPRESSION: Chronic curvature convex to the right.  L1-2 shows 2 mm retrolisthesis and shallow protrusion of the disc with mild stenosis of both lateral recesses. No apparent change.  Left-sided predominant degenerative disc disease and degenerative facet disease at L2-3 and L3-4 without definite neural compression.  Right-sided predominant degenerative disc disease at L4-5 with mild narrowing of the right lateral recess and intervertebral foramen that could be symptomatic. Very similar appearance to the previous study however.  Symmetric degenerative disease at L5-S1 without apparent neural compression.  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   Electronically Signed   By: [place] Chimes M.D.   On: 11/25/2016 15:38  She reports that she has never smoked. She has never used smokeless tobacco. No results for input(s): HGBA1C, LABURIC in the last 8760 hours.  Objective:  VS:  HT:    WT:   BMI:     BP:(!) 149/84  HR:75bpm  TEMP: ( )  RESP:  Physical Exam  Constitutional: She is oriented to person, place, and time. She appears well-developed and well-nourished. No  distress.  HENT:  Head: Normocephalic and atraumatic.  Nose: Nose normal.  Mouth/Throat: Oropharynx is clear and moist.  Eyes: Conjunctivae are normal. Pupils are equal, round, and reactive to light.  Neck: Normal range of motion. Neck supple.  Cardiovascular: Regular rhythm and intact distal pulses.   Pulmonary/Chest: Effort normal and breath sounds normal.  Abdominal: She exhibits no distension.  Musculoskeletal:  Patient is slow to rise from a seated position. She does have concordant low back pain with extension rotation. She has mild pain over the greater trochanters but does not reproduce a lot of her symptoms. She has no clonus bilaterally she has good distal strength. She has no pain with hip rotation.    Neurological: She is alert and oriented to person, place, and time. She exhibits normal muscle tone. Coordination normal.  Skin: Skin is warm. No rash noted. No erythema.  Psychiatric: She has a normal mood and affect. Her behavior is normal.  Nursing note and vitals reviewed.   Ortho Exam Imaging: No results found.  Past Medical/Family/Surgical/Social History: Medications & Allergies reviewed per EMR Patient Active Problem List   Diagnosis Date Noted  . Generalized anxiety disorder 01/02/2017  . Chronic right-sided low back pain with right-sided sciatica 01/02/2017  . Spondylosis without myelopathy or radiculopathy, lumbar region 01/02/2017  . Lumbar radiculopathy 01/02/2017  . Cancer of left breast T1b NX S/P lumpectomy 05/2010, Arimidex, no radiation 06/03/2011   Past Medical History:  Diagnosis Date  . Arthritis   . Cancer ([place])   . Hyperlipidemia   . Hypertension   . Pacemaker 2010   [place]  . Pelvic fracture ([place]) 2010  . Thyroid disease    Family History  Problem Relation Age of Onset  . Heart disease Mother   . Diabetes Father   . Stroke Father   . Cancer Sister     lung  . Cancer Brother     liver  . Pancreatitis Brother    Past Surgical History:  Procedure Laterality Date  . ABDOMINAL HYSTERECTOMY  35 yrs ago  . BREAST LUMPECTOMY  05/23/10   left  . CARDIAC DEFIBRILLATOR PLACEMENT  2 years ago  . CHOLECYSTECTOMY  50 YEARS AGO  . ELBOW SURGERY  2006   Social History   Occupational History  . Not on file.   Social History Main Topics  . Smoking status: Never Smoker  . Smokeless tobacco: Never Used  . Alcohol use No  . Drug use: Unknown  . Sexual activity: Not on file

## 2017-01-02 ENCOUNTER — Encounter (INDEPENDENT_AMBULATORY_CARE_PROVIDER_SITE_OTHER): Payer: Self-pay | Admitting: Physical Medicine and Rehabilitation

## 2017-01-02 DIAGNOSIS — F411 Generalized anxiety disorder: Secondary | ICD-10-CM | POA: Insufficient documentation

## 2017-01-02 DIAGNOSIS — M5441 Lumbago with sciatica, right side: Secondary | ICD-10-CM

## 2017-01-02 DIAGNOSIS — M5416 Radiculopathy, lumbar region: Secondary | ICD-10-CM | POA: Insufficient documentation

## 2017-01-02 DIAGNOSIS — G8929 Other chronic pain: Secondary | ICD-10-CM | POA: Insufficient documentation

## 2017-01-02 DIAGNOSIS — M47816 Spondylosis without myelopathy or radiculopathy, lumbar region: Secondary | ICD-10-CM | POA: Insufficient documentation

## 2017-01-02 NOTE — Telephone Encounter (Signed)
Faxed auth form with Dr. Kennon Portela last 2 office notes to [phone]

## 2017-01-04 NOTE — Telephone Encounter (Signed)
Received auth. [id number] eff 01/02/17-04/02/17. Also received anticoag letter back from Dr. Minna Merritts and it said pt may not d/c Pradaxa due to high risk. Dr. Ernestina Patches said he would be willing to do the injection with her still on blood thinner. I called Dawn and scheduled pt for 01/12/17 @ 9:00.

## 2017-01-05 DIAGNOSIS — Z95810 Presence of automatic (implantable) cardiac defibrillator: Secondary | ICD-10-CM | POA: Diagnosis not present

## 2017-01-10 ENCOUNTER — Telehealth (INDEPENDENT_AMBULATORY_CARE_PROVIDER_SITE_OTHER): Payer: Self-pay | Admitting: Physical Medicine and Rehabilitation

## 2017-01-10 NOTE — Telephone Encounter (Signed)
They want to start with an OV here, and I have scheduled that for 01/18/17 at 1030.

## 2017-01-10 NOTE — Telephone Encounter (Signed)
I would be happy to OV to talk with them but she might be better served by PCP or [facility]

## 2017-01-12 ENCOUNTER — Encounter (INDEPENDENT_AMBULATORY_CARE_PROVIDER_SITE_OTHER): Payer: PPO | Admitting: Physical Medicine and Rehabilitation

## 2017-01-18 ENCOUNTER — Encounter (INDEPENDENT_AMBULATORY_CARE_PROVIDER_SITE_OTHER): Payer: Self-pay | Admitting: Physical Medicine and Rehabilitation

## 2017-01-18 ENCOUNTER — Ambulatory Visit (INDEPENDENT_AMBULATORY_CARE_PROVIDER_SITE_OTHER): Payer: PPO | Admitting: Physical Medicine and Rehabilitation

## 2017-01-18 VITALS — BP 144/81 | HR 78

## 2017-01-18 DIAGNOSIS — M48062 Spinal stenosis, lumbar region with neurogenic claudication: Secondary | ICD-10-CM

## 2017-01-18 DIAGNOSIS — M5416 Radiculopathy, lumbar region: Secondary | ICD-10-CM

## 2017-01-18 NOTE — Progress Notes (Signed)
CYTHINA MICKELSEN - 83 y.o. female MRN [id number]  Date of birth: 1926-05-07  Office Visit Note: Visit Date: 01/18/2017 PCP: Kandice Hams, MD Referred by: Seward Carol, MD  Subjective: Chief Complaint  Patient presents with  . Lower Back - Pain   HPI: Mrs. Kercheval is an 83 year old female who is present with her daughter today provided some of the history. We saw her very recently and decided to repeat an L5 transforaminal epidural steroid injection due to the fact that she has some lateral recess narrowing at L4-5 and pain consistent radicular pain in the leg or an L4-L5 distribution. That pain was not helped by interlaminar injection but in the past a transforaminal injection did help quite well. Unfortunately we tried to get permission to take her off Pradaxa once again her cardiologist has decided that that is probably not wise from a risk to benefit ratio. He suggested more medication management and the patient's returned today for evaluation and discussion. I did discuss with him the fact that the new standard is becoming that transforaminal epidural steroid injections are likely okay to do with current anticoagulation. Like all things in medicine this is not set in stone and there are not specific written guidelines but this is becoming an accepted practice. I discussed this at length with them including the risk and benefits of the patient is going to have her daughter research this a little bit. She has been doing better on scheduled Tylenol 3 times a day. She is up to 300 mg of gabapentin at night. She does have some back pain but her biggest pain as the right hip and leg. She is unable to have an MRI of his head CT scan showing the bony narrowing and facet arthropathy and some listhesis. In the past she's been pretty intolerant of opioid medications per her daughter. She has not been to physical therapy recently and she tells me today that she's fairly unwilling to go. She's had no focal  weakness. She does have trouble walking at times the pain is worse with standing and ambulating. She also tells me today that her knee is really giving her a lot of grief in general more than the leg. This is the right knee. She is seen Dr. Sharol Given for this in the past.    Decided against another injection after talking to cardiologist. Here to discuss option for pain relief. They are working on finding a new PCP. As far as the back pain, she can get out and do some, but then has to sit if she overdoes it. Tylenol TID. Review of Systems  Constitutional: Negative for chills, fever, malaise/fatigue and weight loss.  HENT: Negative for hearing loss and sinus pain.   Eyes: Negative for blurred vision, double vision and photophobia.  Respiratory: Negative for cough and shortness of breath.   Cardiovascular: Negative for chest pain, palpitations and leg swelling.  Gastrointestinal: Negative for abdominal pain, nausea and vomiting.  Genitourinary: Negative for flank pain.  Musculoskeletal: Positive for back pain and joint pain. Negative for myalgias.  Skin: Negative for itching and rash.  Neurological: Negative for tremors, focal weakness and weakness.  Endo/Heme/Allergies: Negative.   Psychiatric/Behavioral: Negative for depression.  All other systems reviewed and are negative.  Otherwise per HPI.  Assessment & Plan: Visit Diagnoses:  1. Lumbar radiculopathy   2. Spinal stenosis of lumbar region with neurogenic claudication     Plan: Findings:  Chronic worsening severe at times low back pain with right  hip and leg pain that is more anterior lateral and posterior lateral and she is somewhat of a poor historian. Prior epidural injection from an L5 transforaminal approach did give her some relief a couple years ago and then slumped relief for a few days more recently that was several months back. Interlaminar epidural injection was not very beneficial at all. The evidence that this is radicular as a  little bit confounding. Unfortunately her cardiologist does not want her to come off of her Pradaxa. We did discuss doing a transforaminal injection without taking off the blood thinner and the risks and current standard of care associated with this. The current standard of care is transitioning towards doing transforaminal epidural injections while still on the blood thinner. I am going to increase her gabapentin and try to get her to 300 mg twice a day and hopefully to 3 times a day if she's tolerating this. At that point we decided that's helping enough. I have also encouraged her to continue to try to move and do things that her spine is not being injured by movement. I would rather have her being asked in what she can. I also discussed the use of tramadol. She has not had that medication for knowledge. They do want to hold off on that for now. She may be somebody who is better served in a comprehensive pain management program but for right now happy to see her. I also talked to her about seeing Dr. Sharol Given for her knee. Her daughters to let us know about that. Lastly, I discussed at length physical therapy and the goals for that and they're going to think about that as well.    Meds & Orders:  Meds ordered this encounter  Medications  . DISCONTD: gabapentin (NEURONTIN) 300 MG capsule    Sig: Take 1 capsule by mouth at night, use 100 mg capsules to start to taper up dosing in the morning as directed. Goal is 300 mg twice a day or 3 times a day.    Dispense:  90 capsule    Refill:  1  . gabapentin (NEURONTIN) 300 MG capsule    Sig: Take 1 capsule by mouth at night. Goal is 300 mg twice a day or 3 times a day.    Dispense:  90 capsule    Refill:  1   No orders of the defined types were placed in this encounter.   Follow-up: Return if symptoms worsen or fail to improve.   Procedures: No procedures performed  No notes on file   Clinical History: IMPRESSION: Chronic curvature convex to the  right.  L1-2 shows 2 mm retrolisthesis and shallow protrusion of the disc with mild stenosis of both lateral recesses. No apparent change.  Left-sided predominant degenerative disc disease and degenerative facet disease at L2-3 and L3-4 without definite neural compression.  Right-sided predominant degenerative disc disease at L4-5 with mild narrowing of the right lateral recess and intervertebral foramen that could be symptomatic. Very similar appearance to the previous study however.  Symmetric degenerative disease at L5-S1 without apparent neural compression.  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   Electronically Signed   By: [place] Chimes M.D.   On: 11/25/2016 15:38  She reports that she has never smoked. She has never used smokeless tobacco. No results for input(s): HGBA1C, LABURIC in the last 8760 hours.  Objective:  VS:  HT:    WT:   BMI:     BP:(!)  144/81  HR:78bpm  TEMP: ( )  RESP:  Physical Exam  Constitutional: She is oriented to person, place, and time. She appears well-developed and well-nourished.  Eyes: Conjunctivae and EOM are normal. Pupils are equal, round, and reactive to light.  Cardiovascular: Normal rate and intact distal pulses.   Pulmonary/Chest: Effort normal.  Musculoskeletal:  Patient is slow to rise from a seated position. She does ambulate with a cane. She has good distal strength. She has no pain with hip rotation. She has some pain around the joint line on the right knee without any frank swelling. She has no locking.  Neurological: She is alert and oriented to person, place, and time. She exhibits abnormal muscle tone. Coordination normal.  Skin: Skin is warm and dry. No rash noted. No erythema.  Psychiatric: She has a normal mood and affect. Her behavior is normal.  Nursing note and vitals reviewed.   Ortho Exam Imaging: No results found.  Past Medical/Family/Surgical/Social  History: Medications & Allergies reviewed per EMR Patient Active Problem List   Diagnosis Date Noted  . Generalized anxiety disorder 01/02/2017  . Chronic right-sided low back pain with right-sided sciatica 01/02/2017  . Spondylosis without myelopathy or radiculopathy, lumbar region 01/02/2017  . Lumbar radiculopathy 01/02/2017  . Cancer of left breast T1b NX S/P lumpectomy 05/2010, Arimidex, no radiation 06/03/2011   Past Medical History:  Diagnosis Date  . Arthritis   . Cancer ([place])   . Hyperlipidemia   . Hypertension   . Pacemaker 2010   [place]  . Pelvic fracture ([place]) 2010  . Thyroid disease    Family History  Problem Relation Age of Onset  . Heart disease Mother   . Diabetes Father   . Stroke Father   . Cancer Sister     lung  . Cancer Brother     liver  . Pancreatitis Brother    Past Surgical History:  Procedure Laterality Date  . ABDOMINAL HYSTERECTOMY  35 yrs ago  . BREAST LUMPECTOMY  05/23/10   left  . CARDIAC DEFIBRILLATOR PLACEMENT  2 years ago  . CHOLECYSTECTOMY  50 YEARS AGO  . ELBOW SURGERY  2006   Social History   Occupational History  . Not on file.   Social History Main Topics  . Smoking status: Never Smoker  . Smokeless tobacco: Never Used  . Alcohol use No  . Drug use: Unknown  . Sexual activity: Not on file

## 2017-01-19 MED ORDER — GABAPENTIN 300 MG PO CAPS: ORAL_CAPSULE | ORAL | 1 refills | Status: DC

## 2017-01-20 ENCOUNTER — Encounter (INDEPENDENT_AMBULATORY_CARE_PROVIDER_SITE_OTHER): Payer: Self-pay | Admitting: Physical Medicine and Rehabilitation

## 2017-02-23 DIAGNOSIS — M810 Age-related osteoporosis without current pathological fracture: Secondary | ICD-10-CM | POA: Diagnosis not present

## 2017-02-28 DIAGNOSIS — I472 Ventricular tachycardia: Secondary | ICD-10-CM | POA: Diagnosis not present

## 2017-03-17 ENCOUNTER — Other Ambulatory Visit (INDEPENDENT_AMBULATORY_CARE_PROVIDER_SITE_OTHER): Payer: Self-pay | Admitting: Physical Medicine and Rehabilitation

## 2017-03-17 DIAGNOSIS — M5416 Radiculopathy, lumbar region: Secondary | ICD-10-CM

## 2017-03-17 NOTE — Telephone Encounter (Signed)
Can you find out what she is actually taking in terms of the gababpentin and reply back to me. My last note in March said she was doing 300mg  at night and I wanted her to try 300mg  twice per day and wrote rx for 300mg  capsule.  The refill request is for 100mg  caps trying to work her up to 300mg  at night.  Just let me know and I can do rx on here later

## 2017-03-17 NOTE — Telephone Encounter (Signed)
Please advise 

## 2017-03-21 MED ORDER — GABAPENTIN 300 MG PO CAPS: ORAL_CAPSULE | ORAL | 2 refills | Status: DC

## 2017-03-21 NOTE — Telephone Encounter (Signed)
Patient is taking 300 mg at night and 100 mg in the morning still working up to 300 mg twice a day. Patient's family is wondering if you can refill the 300 mg and also refill just a few of the 100 mg and they will try to get her up to the recommended 300 mg twice a day.

## 2017-04-06 DIAGNOSIS — Z95810 Presence of automatic (implantable) cardiac defibrillator: Secondary | ICD-10-CM | POA: Diagnosis not present

## 2017-04-06 DIAGNOSIS — I472 Ventricular tachycardia: Secondary | ICD-10-CM | POA: Diagnosis not present

## 2017-04-06 DIAGNOSIS — I480 Paroxysmal atrial fibrillation: Secondary | ICD-10-CM | POA: Diagnosis not present

## 2017-04-06 DIAGNOSIS — R5383 Other fatigue: Secondary | ICD-10-CM | POA: Diagnosis not present

## 2017-04-06 DIAGNOSIS — I10 Essential (primary) hypertension: Secondary | ICD-10-CM | POA: Diagnosis not present

## 2017-04-26 DIAGNOSIS — J358 Other chronic diseases of tonsils and adenoids: Secondary | ICD-10-CM | POA: Diagnosis not present

## 2017-04-26 DIAGNOSIS — H903 Sensorineural hearing loss, bilateral: Secondary | ICD-10-CM | POA: Diagnosis not present

## 2017-06-13 DIAGNOSIS — Z95810 Presence of automatic (implantable) cardiac defibrillator: Secondary | ICD-10-CM | POA: Diagnosis not present

## 2017-06-13 DIAGNOSIS — G4700 Insomnia, unspecified: Secondary | ICD-10-CM | POA: Diagnosis not present

## 2017-06-13 DIAGNOSIS — R7301 Impaired fasting glucose: Secondary | ICD-10-CM | POA: Diagnosis not present

## 2017-06-13 DIAGNOSIS — C50912 Malignant neoplasm of unspecified site of left female breast: Secondary | ICD-10-CM | POA: Diagnosis not present

## 2017-06-13 DIAGNOSIS — E039 Hypothyroidism, unspecified: Secondary | ICD-10-CM | POA: Diagnosis not present

## 2017-06-13 DIAGNOSIS — I4891 Unspecified atrial fibrillation: Secondary | ICD-10-CM | POA: Diagnosis not present

## 2017-06-13 DIAGNOSIS — E7800 Pure hypercholesterolemia, unspecified: Secondary | ICD-10-CM | POA: Diagnosis not present

## 2017-06-13 DIAGNOSIS — F339 Major depressive disorder, recurrent, unspecified: Secondary | ICD-10-CM | POA: Diagnosis not present

## 2017-06-13 DIAGNOSIS — I10 Essential (primary) hypertension: Secondary | ICD-10-CM | POA: Diagnosis not present

## 2017-06-13 DIAGNOSIS — M810 Age-related osteoporosis without current pathological fracture: Secondary | ICD-10-CM | POA: Diagnosis not present

## 2017-06-26 ENCOUNTER — Ambulatory Visit (INDEPENDENT_AMBULATORY_CARE_PROVIDER_SITE_OTHER): Payer: PPO | Admitting: Orthopedic Surgery

## 2017-06-26 ENCOUNTER — Encounter (INDEPENDENT_AMBULATORY_CARE_PROVIDER_SITE_OTHER): Payer: Self-pay | Admitting: Orthopedic Surgery

## 2017-06-26 VITALS — Ht 64.0 in | Wt 192.0 lb

## 2017-06-26 DIAGNOSIS — G8929 Other chronic pain: Secondary | ICD-10-CM

## 2017-06-26 DIAGNOSIS — M5441 Lumbago with sciatica, right side: Secondary | ICD-10-CM | POA: Diagnosis not present

## 2017-06-26 DIAGNOSIS — M1711 Unilateral primary osteoarthritis, right knee: Secondary | ICD-10-CM | POA: Diagnosis not present

## 2017-06-26 MED ORDER — METHYLPREDNISOLONE ACETATE 40 MG/ML IJ SUSP
40.0000 mg | INTRAMUSCULAR | Status: AC | PRN
  Administered 2017-06-26: 40 mg via INTRA_ARTICULAR

## 2017-06-26 MED ORDER — LIDOCAINE HCL 1 % IJ SOLN
5.0000 mL | INTRAMUSCULAR | Status: AC | PRN
  Administered 2017-06-26: 5 mL

## 2017-06-26 NOTE — Addendum Note (Signed)
Addended by: Dondra Prader R on: 06/26/2017 11:14 AM   Modules accepted: Orders

## 2017-06-26 NOTE — Progress Notes (Signed)
Office Visit Note   Patient: [name]           Date of Birth: 03/13/1926           MRN: [id number] Visit Date: 06/26/2017              Requested by: Seward Carol, MD [address] [place], [place] [address] PCP: Seward Carol, MD  Chief Complaint  Patient presents with  . Right Knee - Pain    S/p injection 06/16/16      HPI: The patient is an 83 year old woman who presents today complaining of right knee pain. She has a history of osteoarthritis. Also has lumbar radiculopathy on the right. She is had previous ESI with Dr. Ernestina Patches. Her cardiologist is requested that they no longer stop her pradaxa. States she can no longer health injections for her back and leg pain. She does today complaining of some start up stiffness and posterior knee pain. Has had interval relief with injections in the past. Her last knee injection was in August of last year.  Assessment & Plan: Visit Diagnoses:  1. Primary osteoarthritis of right knee   2. Chronic right-sided low back pain with right-sided sciatica     Plan: Injection today follow-up in office as needed.  Follow-Up Instructions: Return if symptoms worsen or fail to improve.   Right Knee Exam   Tenderness  The patient is experiencing tenderness in the medial joint line.  Range of Motion  The patient has normal right knee ROM.  Muscle Strength   The patient has normal right knee strength.  Tests  Varus: negative Valgus: negative  Other  Erythema: absent Swelling: none      Patient is alert, oriented, no adenopathy, well-dressed, normal affect, normal respiratory effort.   Imaging: No results found. No images are attached to the encounter.  Labs: No results found for: HGBA1C, ESRSEDRATE, CRP, LABURIC, REPTSTATUS, GRAMSTAIN, CULT, LABORGA  Orders:  No orders of the defined types were placed in this encounter.  No orders of the defined types were placed in this encounter.     Procedures: Large Joint Inj Date/Time: 06/26/2017 10:10 AM Performed by: Suzan Slick Authorized by: Dondra Prader R   Consent Given by:  Patient Site marked: the procedure site was marked   Timeout: prior to procedure the correct patient, procedure, and site was verified   Indications:  Pain and diagnostic evaluation Location:  Knee Site:  R knee Needle Size:  22 G Needle Length:  1.5 inches Ultrasound Guidance: No   Fluoroscopic Guidance: No   Arthrogram: No   Medications:  5 mL lidocaine 1 %; 40 mg methylPREDNISolone acetate 40 MG/ML Aspiration Attempted: No   Patient tolerance:  Patient tolerated the procedure well with no immediate complications    Clinical Data: No additional findings.  ROS:  All other systems negative, except as noted in the HPI. Review of Systems  Constitutional: Negative for chills and fever.  Musculoskeletal: Positive for arthralgias. Negative for joint swelling and myalgias.    Objective: Vital Signs: Ht 5\' 4"  (1.626 m)   Wt 192 lb (87.1 kg)   BMI 32.96 kg/m   Specialty Comments:  No specialty comments available.  PMFS History: Patient Active Problem List   Diagnosis Date Noted  . Generalized anxiety disorder 01/02/2017  . Chronic right-sided low back pain with right-sided sciatica 01/02/2017  . Spondylosis without myelopathy or radiculopathy, lumbar region 01/02/2017  . Lumbar radiculopathy 01/02/2017  . Cancer  of left breast T1b NX S/P lumpectomy 05/2010, Arimidex, no radiation 06/03/2011   Past Medical History:  Diagnosis Date  . Arthritis   . Cancer ([place])   . Hyperlipidemia   . Hypertension   . Pacemaker 2010   [place]  . Pelvic fracture ([place]) 2010  . Thyroid disease     Family History  Problem Relation Age of Onset  . Heart disease Mother   . Diabetes Father   . Stroke Father   . Cancer Sister        lung  . Cancer Brother        liver  . Pancreatitis Brother     Past Surgical History:  Procedure Laterality  Date  . ABDOMINAL HYSTERECTOMY  35 yrs ago  . BREAST LUMPECTOMY  05/23/10   left  . CARDIAC DEFIBRILLATOR PLACEMENT  2 years ago  . CHOLECYSTECTOMY  50 YEARS AGO  . ELBOW SURGERY  2006   Social History   Occupational History  . Not on file.   Social History Main Topics  . Smoking status: Never Smoker  . Smokeless tobacco: Never Used  . Alcohol use No  . Drug use: Unknown  . Sexual activity: Not on file

## 2017-07-05 ENCOUNTER — Other Ambulatory Visit: Payer: Self-pay | Admitting: Internal Medicine

## 2017-07-05 DIAGNOSIS — Z853 Personal history of malignant neoplasm of breast: Secondary | ICD-10-CM

## 2017-07-18 ENCOUNTER — Ambulatory Visit (INDEPENDENT_AMBULATORY_CARE_PROVIDER_SITE_OTHER): Payer: PPO | Admitting: Physical Medicine and Rehabilitation

## 2017-07-18 ENCOUNTER — Encounter (INDEPENDENT_AMBULATORY_CARE_PROVIDER_SITE_OTHER): Payer: Self-pay | Admitting: Physical Medicine and Rehabilitation

## 2017-07-18 VITALS — BP 136/77 | HR 73

## 2017-07-18 DIAGNOSIS — F411 Generalized anxiety disorder: Secondary | ICD-10-CM | POA: Diagnosis not present

## 2017-07-18 DIAGNOSIS — Z7901 Long term (current) use of anticoagulants: Secondary | ICD-10-CM | POA: Diagnosis not present

## 2017-07-18 DIAGNOSIS — M5416 Radiculopathy, lumbar region: Secondary | ICD-10-CM | POA: Diagnosis not present

## 2017-07-18 DIAGNOSIS — M48062 Spinal stenosis, lumbar region with neurogenic claudication: Secondary | ICD-10-CM | POA: Diagnosis not present

## 2017-07-18 NOTE — Progress Notes (Deleted)
Cardiologist does not want her to stop Pradaxa. Has been taking Gabapentin, and this seems to help some. Pain across lower back and down front of legs some.

## 2017-07-24 DIAGNOSIS — Z95810 Presence of automatic (implantable) cardiac defibrillator: Secondary | ICD-10-CM | POA: Diagnosis not present

## 2017-07-26 DIAGNOSIS — Z23 Encounter for immunization: Secondary | ICD-10-CM | POA: Diagnosis not present

## 2017-07-26 DIAGNOSIS — E039 Hypothyroidism, unspecified: Secondary | ICD-10-CM | POA: Diagnosis not present

## 2017-07-27 ENCOUNTER — Encounter (INDEPENDENT_AMBULATORY_CARE_PROVIDER_SITE_OTHER): Payer: Self-pay | Admitting: Physical Medicine and Rehabilitation

## 2017-07-27 NOTE — Progress Notes (Signed)
[name] - 83 y.o. female MRN [id number]  Date of birth: 1926-03-19  Office Visit Note: Visit Date: 07/18/2017 PCP: Seward Carol, MD Referred by: Seward Carol, MD  Subjective: Chief Complaint  Patient presents with  . Lower Back - Pain   HPI: [name] is an 83 year old female that we seen on a few occasions he continues to have what we feel like his right L5 radicular leg pain. She continues to have severe pain which is worse with standing and ambulating and better at rest. She doesn't get relief with any medications or activity modification and this is been an ongoing problem. She's been followed by Dr. Sharol Given and Dondra Prader, [place].  She comes in today with I believe her niece as well as her granddaughter. They provided some of the history and are usually present when we seen her. The patient's daughter is not present but is someone that is usually involved with her care. The main issue and we have this talk back in March is the fact that her cardiologist does not want her to have her Pradaxa discontinued for procedure. In terms of her pain is still continued right buttock and leg pain. Some type of paresthesias well. No left-sided complaints. No focal weakness but feels weak in general. She is very frustrated with the amount of pain that she is having a medications have not helped. She's had therapy in the past but not recently. She is on Pradaxa.    Review of Systems  Constitutional: Positive for malaise/fatigue. Negative for chills, fever and weight loss.  HENT: Negative for hearing loss and sinus pain.   Eyes: Negative for blurred vision, double vision and photophobia.  Respiratory: Negative for cough and shortness of breath.   Cardiovascular: Negative for chest pain, palpitations and leg swelling.  Gastrointestinal: Negative for abdominal pain, nausea and vomiting.  Genitourinary: Negative for flank pain.  Musculoskeletal: Positive for back pain. Negative for myalgias.    Right hip and leg pain  Skin: Negative for itching and rash.  Neurological: Positive for weakness. Negative for tremors and focal weakness.  Endo/Heme/Allergies: Negative.   Psychiatric/Behavioral: Negative for depression.  All other systems reviewed and are negative.  Otherwise per HPI.  Assessment & Plan: Visit Diagnoses:  1. Lumbar radiculopathy   2. Spinal stenosis of lumbar region with neurogenic claudication   3. Generalized anxiety disorder   4. Anticoagulated     Plan: Findings:  Chronic worsening severe right low back pain with referral into the hip and leg. This still seems from a standpoint of the patterning she gives [place] a L5 radicular type pattern. MRI from the beginning of the year shows right-sided degenerative disc changes with narrowing of the right lateral recess. This is at L4-5. This would potentially cause an L5 radiculopathy radiculitis. She also has worsening sacroiliac joint arthritis. She could be getting some referral pain from the sacroiliac joints as well. We discussed at length with the patient and her caregivers that the trend and standard of care has become using a transforaminal approach for the epidural and leaving the patient on the blood thinner. We talked about the risk of doing this. We did talk about that there is no published standard for doing this but this is becoming more except that I do feel like the standards that are written will be changed pretty quickly. We have started doing these injections from a transforaminal approach with the patient on the blood thinner. They want to discuss this with  her daughter and they'll let us know otherwise. I would continue to try to believe that an L5 transforaminal injection would help more than a sacroiliac joint injection. We did discuss the fact and sacral iliac joint injection is not a spinal injection and we never withhold the blood thinner for this injection. Otherwise she'll continue to follow up with Dr. Sharol Given.  From a medication standpoint she is on gabapentin. This could be increased. She does take an antidepressant, Paxil. This could be switched to a medicine with more norepinephrine reuptake inhibition which could prove to help with the pain to some degree. This will have to be coordinated with whomever has her on the Paxil. Greater than 50% of this visit (total duration of 40 min.) was spent in counseling and coordination of care discussing lumbar radiculitis radiculopathy as well as anticoagulation risk and medication management.    Meds & Orders: No orders of the defined types were placed in this encounter.  No orders of the defined types were placed in this encounter.   Follow-up: Return if symptoms worsen or fail to improve.   Procedures: No procedures performed  No notes on file   Clinical History: IMPRESSION: Chronic curvature convex to the right.  L1-2 shows 2 mm retrolisthesis and shallow protrusion of the disc with mild stenosis of both lateral recesses. No apparent change.  Left-sided predominant degenerative disc disease and degenerative facet disease at L2-3 and L3-4 without definite neural compression.  Right-sided predominant degenerative disc disease at L4-5 with mild narrowing of the right lateral recess and intervertebral foramen that could be symptomatic. Very similar appearance to the previous study however.  Symmetric degenerative disease at L5-S1 without apparent neural compression.  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   Electronically Signed   By: Nelson Chimes M.D.   On: 11/25/2016 15:38  She reports that she has never smoked. She has never used smokeless tobacco. No results for input(s): HGBA1C, LABURIC in the last 8760 hours.  Objective:  VS:  HT:    WT:   BMI:     BP:136/77  HR:73bpm  TEMP: ( )  RESP:  Physical Exam  Constitutional: She is oriented to person, place, and time. She appears  well-developed and well-nourished. No distress.  HENT:  Head: Normocephalic and atraumatic.  Nose: Nose normal.  Mouth/Throat: Oropharynx is clear and moist.  Eyes: Pupils are equal, round, and reactive to light. Conjunctivae are normal.  Neck: Normal range of motion. Neck supple.  Cardiovascular: Regular rhythm and intact distal pulses.   Pulmonary/Chest: Effort normal. No respiratory distress.  Abdominal: She exhibits no distension. There is no guarding.  Musculoskeletal:  Patient ambulates with an antalgic gait to the right. She does use a cane. She is very slow to rise from a seated position. She does have some back pain with extension of the lumbar spine. She has no pain with hip rotation. There is some mild pain with external rotation. She has a positive Fortin finger test on the right. She has good distal strength. No clonus.  Neurological: She is alert and oriented to person, place, and time. She exhibits normal muscle tone. Coordination normal.  Skin: Skin is warm. No rash noted. No erythema.  Psychiatric: She has a normal mood and affect. Her behavior is normal.  Nursing note and vitals reviewed.   Ortho Exam Imaging: No results found.  Past Medical/Family/Surgical/Social History: Medications & Allergies reviewed per EMR Patient Active Problem List   Diagnosis Date  Noted  . Generalized anxiety disorder 01/02/2017  . Chronic right-sided low back pain with right-sided sciatica 01/02/2017  . Spondylosis without myelopathy or radiculopathy, lumbar region 01/02/2017  . Lumbar radiculopathy 01/02/2017  . Cancer of left breast T1b NX S/P lumpectomy 05/2010, Arimidex, no radiation 06/03/2011   Past Medical History:  Diagnosis Date  . Arthritis   . Cancer ([place])   . Hyperlipidemia   . Hypertension   . Pacemaker 2010   [place]  . Pelvic fracture ([place]) 2010  . Thyroid disease    Family History  Problem Relation Age of Onset  . Heart disease Mother   . Diabetes Father     . Stroke Father   . Cancer Sister        lung  . Cancer Brother        liver  . Pancreatitis Brother    Past Surgical History:  Procedure Laterality Date  . ABDOMINAL HYSTERECTOMY  35 yrs ago  . BREAST LUMPECTOMY  05/23/10   left  . CARDIAC DEFIBRILLATOR PLACEMENT  2 years ago  . CHOLECYSTECTOMY  50 YEARS AGO  . ELBOW SURGERY  2006   Social History   Occupational History  . Not on file.   Social History Main Topics  . Smoking status: Never Smoker  . Smokeless tobacco: Never Used  . Alcohol use No  . Drug use: Unknown  . Sexual activity: Not on file

## 2017-08-02 DIAGNOSIS — L814 Other melanin hyperpigmentation: Secondary | ICD-10-CM | POA: Diagnosis not present

## 2017-08-02 DIAGNOSIS — L820 Inflamed seborrheic keratosis: Secondary | ICD-10-CM | POA: Diagnosis not present

## 2017-08-02 DIAGNOSIS — L821 Other seborrheic keratosis: Secondary | ICD-10-CM | POA: Diagnosis not present

## 2017-08-02 DIAGNOSIS — L304 Erythema intertrigo: Secondary | ICD-10-CM | POA: Diagnosis not present

## 2017-08-02 DIAGNOSIS — D692 Other nonthrombocytopenic purpura: Secondary | ICD-10-CM | POA: Diagnosis not present

## 2017-08-02 DIAGNOSIS — Z85828 Personal history of other malignant neoplasm of skin: Secondary | ICD-10-CM | POA: Diagnosis not present

## 2017-08-21 ENCOUNTER — Ambulatory Visit
Admission: RE | Admit: 2017-08-21 | Discharge: 2017-08-21 | Disposition: A | Discharge status: Home / Self Care | Payer: PPO | Source: Ambulatory Visit | Attending: Internal Medicine | Admitting: Internal Medicine

## 2017-08-21 DIAGNOSIS — R928 Other abnormal and inconclusive findings on diagnostic imaging of breast: Secondary | ICD-10-CM | POA: Diagnosis not present

## 2017-08-21 DIAGNOSIS — Z853 Personal history of malignant neoplasm of breast: Secondary | ICD-10-CM

## 2017-08-25 DIAGNOSIS — H52203 Unspecified astigmatism, bilateral: Secondary | ICD-10-CM | POA: Diagnosis not present

## 2017-08-25 DIAGNOSIS — H26493 Other secondary cataract, bilateral: Secondary | ICD-10-CM | POA: Diagnosis not present

## 2017-08-25 DIAGNOSIS — H04123 Dry eye syndrome of bilateral lacrimal glands: Secondary | ICD-10-CM | POA: Diagnosis not present

## 2017-09-06 ENCOUNTER — Other Ambulatory Visit (INDEPENDENT_AMBULATORY_CARE_PROVIDER_SITE_OTHER): Payer: Self-pay | Admitting: Physical Medicine and Rehabilitation

## 2017-09-06 DIAGNOSIS — M5416 Radiculopathy, lumbar region: Secondary | ICD-10-CM

## 2017-09-06 NOTE — Telephone Encounter (Signed)
Please advise 

## 2017-09-07 ENCOUNTER — Other Ambulatory Visit (INDEPENDENT_AMBULATORY_CARE_PROVIDER_SITE_OTHER): Payer: Self-pay | Admitting: Physical Medicine and Rehabilitation

## 2017-09-07 DIAGNOSIS — M5416 Radiculopathy, lumbar region: Secondary | ICD-10-CM

## 2017-09-18 NOTE — Telephone Encounter (Signed)
Please advise 

## 2017-10-05 ENCOUNTER — Ambulatory Visit (INDEPENDENT_AMBULATORY_CARE_PROVIDER_SITE_OTHER): Payer: PPO | Admitting: Family

## 2017-10-05 DIAGNOSIS — Z95810 Presence of automatic (implantable) cardiac defibrillator: Secondary | ICD-10-CM | POA: Diagnosis not present

## 2017-10-06 ENCOUNTER — Encounter (INDEPENDENT_AMBULATORY_CARE_PROVIDER_SITE_OTHER): Payer: Self-pay | Admitting: Family

## 2017-10-06 ENCOUNTER — Ambulatory Visit (INDEPENDENT_AMBULATORY_CARE_PROVIDER_SITE_OTHER): Payer: PPO | Admitting: Family

## 2017-10-06 DIAGNOSIS — M1711 Unilateral primary osteoarthritis, right knee: Secondary | ICD-10-CM | POA: Diagnosis not present

## 2017-10-06 MED ORDER — METHYLPREDNISOLONE ACETATE 40 MG/ML IJ SUSP
40.0000 mg | INTRAMUSCULAR | Status: AC | PRN
  Administered 2017-10-06: 40 mg via INTRA_ARTICULAR

## 2017-10-06 MED ORDER — LIDOCAINE HCL 1 % IJ SOLN
5.0000 mL | INTRAMUSCULAR | Status: AC | PRN
  Administered 2017-10-06: 5 mL

## 2017-10-06 NOTE — Progress Notes (Signed)
Office Visit Note   Patient: [name]           Date of Birth: 1926/07/07           MRN: [id number] Visit Date: 10/06/2017              Requested by: Seward Carol, MD [address] [place], [place] [address] PCP: Seward Carol, MD  Chief Complaint  Patient presents with  . Right Knee - Pain      HPI: The patient is an 83 year old woman who presents today complaining of right knee pain. She has a history of osteoarthritis. She does today complaining of some start up stiffness and posterior knee pain. Has had interval relief with injections in the past. Her last knee injection was 3 months ago.  Assessment & Plan: Visit Diagnoses:  1. Primary osteoarthritis of right knee     Plan: Injection today. follow-up in office as needed.  Follow-Up Instructions: Return if symptoms worsen or fail to improve.   Right Knee Exam   Muscle Strength  The patient has normal right knee strength.  Tenderness  The patient is experiencing tenderness in the medial joint line.  Range of Motion  The patient has normal right knee ROM.  Tests  Varus: negative Valgus: negative  Other  Erythema: absent Swelling: none      Patient is alert, oriented, no adenopathy, well-dressed, normal affect, normal respiratory effort.   Imaging: No results found. No images are attached to the encounter.  Labs: No results found for: HGBA1C, ESRSEDRATE, CRP, LABURIC, REPTSTATUS, GRAMSTAIN, CULT, LABORGA  Orders:  Orders Placed This Encounter  Procedures  . Large Joint Inj   No orders of the defined types were placed in this encounter.    Procedures: Large Joint Inj: R knee on 10/06/2017 1:58 PM Indications: pain Details: 18 G 1.5 in needle, anteromedial approach Medications: 5 mL lidocaine 1 %; 40 mg methylPREDNISolone acetate 40 MG/ML Consent was given by the patient.      Clinical Data: No additional findings.  ROS:  All other systems negative, except as  noted in the HPI. Review of Systems  Constitutional: Negative for chills and fever.  Musculoskeletal: Positive for arthralgias. Negative for joint swelling and myalgias.    Objective: Vital Signs: There were no vitals taken for this visit.  Specialty Comments:  No specialty comments available.  PMFS History: Patient Active Problem List   Diagnosis Date Noted  . Generalized anxiety disorder 01/02/2017  . Chronic right-sided low back pain with right-sided sciatica 01/02/2017  . Spondylosis without myelopathy or radiculopathy, lumbar region 01/02/2017  . Lumbar radiculopathy 01/02/2017  . Cancer of left breast T1b NX S/P lumpectomy 05/2010, Arimidex, no radiation 06/03/2011   Past Medical History:  Diagnosis Date  . Arthritis   . Cancer ([place])   . Hyperlipidemia   . Hypertension   . Pacemaker 2010   [place]  . Pelvic fracture ([place]) 2010  . Thyroid disease     Family History  Problem Relation Age of Onset  . Heart disease Mother   . Diabetes Father   . Stroke Father   . Cancer Sister        lung  . Cancer Brother        liver  . Pancreatitis Brother     Past Surgical History:  Procedure Laterality Date  . ABDOMINAL HYSTERECTOMY  35 yrs ago  . BREAST LUMPECTOMY  05/23/10   left  . CARDIAC DEFIBRILLATOR  PLACEMENT  2 years ago  . CHOLECYSTECTOMY  50 YEARS AGO  . ELBOW SURGERY  2006   Social History   Occupational History  . Not on file  Tobacco Use  . Smoking status: Never Smoker  . Smokeless tobacco: Never Used  Substance and Sexual Activity  . Alcohol use: No  . Drug use: Not on file  . Sexual activity: Not on file

## 2017-10-09 DIAGNOSIS — J069 Acute upper respiratory infection, unspecified: Secondary | ICD-10-CM | POA: Diagnosis not present

## 2017-11-10 DIAGNOSIS — I480 Paroxysmal atrial fibrillation: Secondary | ICD-10-CM | POA: Diagnosis not present

## 2017-11-10 DIAGNOSIS — Z95810 Presence of automatic (implantable) cardiac defibrillator: Secondary | ICD-10-CM | POA: Diagnosis not present

## 2017-11-10 DIAGNOSIS — I10 Essential (primary) hypertension: Secondary | ICD-10-CM | POA: Diagnosis not present

## 2017-11-10 DIAGNOSIS — I472 Ventricular tachycardia: Secondary | ICD-10-CM | POA: Diagnosis not present

## 2017-12-06 ENCOUNTER — Encounter (INDEPENDENT_AMBULATORY_CARE_PROVIDER_SITE_OTHER): Payer: Self-pay | Admitting: Family

## 2017-12-06 ENCOUNTER — Ambulatory Visit (INDEPENDENT_AMBULATORY_CARE_PROVIDER_SITE_OTHER): Payer: PPO | Admitting: Family

## 2017-12-06 VITALS — Ht 64.0 in | Wt 192.0 lb

## 2017-12-06 DIAGNOSIS — M1711 Unilateral primary osteoarthritis, right knee: Secondary | ICD-10-CM | POA: Diagnosis not present

## 2017-12-06 MED ORDER — METHYLPREDNISOLONE ACETATE 40 MG/ML IJ SUSP
40.0000 mg | INTRAMUSCULAR | Status: AC | PRN
  Administered 2017-12-06: 40 mg via INTRA_ARTICULAR

## 2017-12-06 MED ORDER — LIDOCAINE HCL 1 % IJ SOLN
5.0000 mL | INTRAMUSCULAR | Status: AC | PRN
  Administered 2017-12-06: 5 mL

## 2017-12-06 NOTE — Progress Notes (Signed)
Office Visit Note   Patient: [name]           Date of Birth: Apr 19, 1926           MRN: [id number] Visit Date: 12/06/2017              Requested by: Seward Carol, MD [address] [place], [place] [address] PCP: Seward Carol, MD  Chief Complaint  Patient presents with  . Right Knee - Pain    S/p injection 10/06/17      HPI: The patient is an 84 year old woman who presents today complaining of recurrennce of her chronic right knee pain. She has a history of osteoarthritis. She does today complaining of some start up stiffness and posterior knee pain. Has had interval relief with injections in the past. Her last knee injection was 3 months ago.  Assessment & Plan: Visit Diagnoses:  1. Primary osteoarthritis of right knee     Plan: Injection today. follow-up in office as needed.  Follow-Up Instructions: Return in about 3 months (around 03/06/2018), or if symptoms worsen or fail to improve.   Right Knee Exam   Muscle Strength  The patient has normal right knee strength.  Tenderness  The patient is experiencing tenderness in the medial joint line.  Range of Motion  The patient has normal right knee ROM.  Tests  Varus: negative Valgus: negative  Other  Erythema: absent Swelling: none      Patient is alert, oriented, no adenopathy, well-dressed, normal affect, normal respiratory effort.   Imaging: No results found. No images are attached to the encounter.  Labs: No results found for: HGBA1C, ESRSEDRATE, CRP, LABURIC, REPTSTATUS, GRAMSTAIN, CULT, LABORGA  Orders:  No orders of the defined types were placed in this encounter.  No orders of the defined types were placed in this encounter.    Procedures: Large Joint Inj: R knee on 12/06/2017 3:32 PM Indications: pain Details: 18 G 1.5 in needle, anteromedial approach Medications: 5 mL lidocaine 1 %; 40 mg methylPREDNISolone acetate 40 MG/ML Consent was given by the patient.       Clinical Data: No additional findings.  ROS:  All other systems negative, except as noted in the HPI. Review of Systems  Constitutional: Negative for chills and fever.  Musculoskeletal: Positive for arthralgias. Negative for joint swelling and myalgias.    Objective: Vital Signs: Ht 5\' 4"  (1.626 m)   Wt 192 lb (87.1 kg)   BMI 32.96 kg/m   Specialty Comments:  No specialty comments available.  PMFS History: Patient Active Problem List   Diagnosis Date Noted  . Generalized anxiety disorder 01/02/2017  . Chronic right-sided low back pain with right-sided sciatica 01/02/2017  . Spondylosis without myelopathy or radiculopathy, lumbar region 01/02/2017  . Lumbar radiculopathy 01/02/2017  . Cancer of left breast T1b NX S/P lumpectomy 05/2010, Arimidex, no radiation 06/03/2011   Past Medical History:  Diagnosis Date  . Arthritis   . Cancer ([place])   . Hyperlipidemia   . Hypertension   . Pacemaker 2010   [place]  . Pelvic fracture ([place]) 2010  . Thyroid disease     Family History  Problem Relation Age of Onset  . Heart disease Mother   . Diabetes Father   . Stroke Father   . Cancer Sister        lung  . Cancer Brother        liver  . Pancreatitis Brother     Past Surgical  History:  Procedure Laterality Date  . ABDOMINAL HYSTERECTOMY  35 yrs ago  . BREAST LUMPECTOMY  05/23/10   left  . CARDIAC DEFIBRILLATOR PLACEMENT  2 years ago  . CHOLECYSTECTOMY  50 YEARS AGO  . ELBOW SURGERY  2006   Social History   Occupational History  . Not on file  Tobacco Use  . Smoking status: Never Smoker  . Smokeless tobacco: Never Used  Substance and Sexual Activity  . Alcohol use: No  . Drug use: Not on file  . Sexual activity: Not on file

## 2017-12-11 ENCOUNTER — Telehealth (INDEPENDENT_AMBULATORY_CARE_PROVIDER_SITE_OTHER): Payer: Self-pay | Admitting: Orthopedic Surgery

## 2017-12-11 ENCOUNTER — Other Ambulatory Visit (INDEPENDENT_AMBULATORY_CARE_PROVIDER_SITE_OTHER): Payer: Self-pay | Admitting: Family

## 2017-12-11 NOTE — Telephone Encounter (Signed)
Patients daughter called on behalf of patient stating that she is still in a lot of pain in her back and isn't getting any relief. Would like to know her options and see what would be the best step to take next in this process. CB # [phone]

## 2017-12-11 NOTE — Telephone Encounter (Signed)
Pt saw FN 07/2017. States that the injection did not work and is wanting to know what else she can do?

## 2017-12-11 NOTE — Telephone Encounter (Signed)
Please advise. No injection done in September- discussed TF while on Pradaxa.

## 2017-12-12 NOTE — Telephone Encounter (Signed)
As before, two level trans esi vs interlam, medication trial ie things that may help nerve pain, PT or referral to neurology or other if felt needed

## 2017-12-12 NOTE — Telephone Encounter (Signed)
Patient's daughter will discuss her sister and mother and get back to me.

## 2017-12-12 NOTE — Telephone Encounter (Signed)
Patient wants to try TF as discussed. She will continue Pradaxa. Scheduled for 2/20 at 1300.

## 2017-12-25 ENCOUNTER — Emergency Department (HOSPITAL_COMMUNITY)
Admission: EM | Admit: 2017-12-25 | Discharge: 2017-12-25 | Disposition: A | Discharge status: Home / Self Care | Payer: PPO | Attending: Emergency Medicine | Admitting: Emergency Medicine

## 2017-12-25 ENCOUNTER — Emergency Department (HOSPITAL_COMMUNITY): Payer: PPO

## 2017-12-25 ENCOUNTER — Encounter (HOSPITAL_COMMUNITY): Payer: Self-pay | Admitting: Emergency Medicine

## 2017-12-25 DIAGNOSIS — S0990XA Unspecified injury of head, initial encounter: Secondary | ICD-10-CM | POA: Diagnosis not present

## 2017-12-25 DIAGNOSIS — S098XXA Other specified injuries of head, initial encounter: Secondary | ICD-10-CM | POA: Diagnosis not present

## 2017-12-25 DIAGNOSIS — Z853 Personal history of malignant neoplasm of breast: Secondary | ICD-10-CM | POA: Diagnosis not present

## 2017-12-25 DIAGNOSIS — Z950 Presence of cardiac pacemaker: Secondary | ICD-10-CM | POA: Insufficient documentation

## 2017-12-25 DIAGNOSIS — R51 Headache: Secondary | ICD-10-CM | POA: Diagnosis not present

## 2017-12-25 DIAGNOSIS — R079 Chest pain, unspecified: Secondary | ICD-10-CM | POA: Diagnosis not present

## 2017-12-25 DIAGNOSIS — N3001 Acute cystitis with hematuria: Secondary | ICD-10-CM | POA: Insufficient documentation

## 2017-12-25 DIAGNOSIS — Y999 Unspecified external cause status: Secondary | ICD-10-CM | POA: Diagnosis not present

## 2017-12-25 DIAGNOSIS — R55 Syncope and collapse: Secondary | ICD-10-CM | POA: Insufficient documentation

## 2017-12-25 DIAGNOSIS — Z79899 Other long term (current) drug therapy: Secondary | ICD-10-CM | POA: Diagnosis not present

## 2017-12-25 DIAGNOSIS — Y929 Unspecified place or not applicable: Secondary | ICD-10-CM | POA: Insufficient documentation

## 2017-12-25 DIAGNOSIS — W19XXXA Unspecified fall, initial encounter: Secondary | ICD-10-CM | POA: Diagnosis not present

## 2017-12-25 DIAGNOSIS — S0101XA Laceration without foreign body of scalp, initial encounter: Secondary | ICD-10-CM | POA: Diagnosis not present

## 2017-12-25 DIAGNOSIS — Z7901 Long term (current) use of anticoagulants: Secondary | ICD-10-CM | POA: Diagnosis not present

## 2017-12-25 DIAGNOSIS — S199XXA Unspecified injury of neck, initial encounter: Secondary | ICD-10-CM | POA: Diagnosis not present

## 2017-12-25 DIAGNOSIS — I10 Essential (primary) hypertension: Secondary | ICD-10-CM | POA: Insufficient documentation

## 2017-12-25 DIAGNOSIS — Y939 Activity, unspecified: Secondary | ICD-10-CM | POA: Diagnosis not present

## 2017-12-25 DIAGNOSIS — Z7982 Long term (current) use of aspirin: Secondary | ICD-10-CM | POA: Insufficient documentation

## 2017-12-25 DIAGNOSIS — M542 Cervicalgia: Secondary | ICD-10-CM | POA: Diagnosis not present

## 2017-12-25 DIAGNOSIS — S0181XA Laceration without foreign body of other part of head, initial encounter: Secondary | ICD-10-CM | POA: Diagnosis not present

## 2017-12-25 DIAGNOSIS — M25512 Pain in left shoulder: Secondary | ICD-10-CM | POA: Diagnosis not present

## 2017-12-25 LAB — CBC WITH DIFFERENTIAL/PLATELET
BASOS ABS: 0 10*3/uL (ref 0.0–0.1)
Basophils Relative: 0 %
EOS ABS: 0 10*3/uL (ref 0.0–0.7)
EOS PCT: 0 %
HCT: 45.7 % (ref 36.0–46.0)
Hemoglobin: 15.1 g/dL — ABNORMAL HIGH (ref 12.0–15.0)
Lymphocytes Relative: 9 %
Lymphs Abs: 0.9 10*3/uL (ref 0.7–4.0)
MCH: 30.6 pg (ref 26.0–34.0)
MCHC: 33 g/dL (ref 30.0–36.0)
MCV: 92.5 fL (ref 78.0–100.0)
Monocytes Absolute: 0.8 10*3/uL (ref 0.1–1.0)
Monocytes Relative: 8 %
Neutro Abs: 8.5 10*3/uL — ABNORMAL HIGH (ref 1.7–7.7)
Neutrophils Relative %: 83 %
PLATELETS: 193 10*3/uL (ref 150–400)
RBC: 4.94 MIL/uL (ref 3.87–5.11)
RDW: 14.3 % (ref 11.5–15.5)
WBC: 10.2 10*3/uL (ref 4.0–10.5)

## 2017-12-25 LAB — URINALYSIS, ROUTINE W REFLEX MICROSCOPIC
BILIRUBIN URINE: NEGATIVE
GLUCOSE, UA: NEGATIVE mg/dL
Hgb urine dipstick: NEGATIVE
Ketones, ur: 80 mg/dL — AB
NITRITE: NEGATIVE
PROTEIN: 30 mg/dL — AB
Specific Gravity, Urine: 1.028 (ref 1.005–1.030)
pH: 5 (ref 5.0–8.0)

## 2017-12-25 LAB — COMPREHENSIVE METABOLIC PANEL
ALT: 21 U/L (ref 14–54)
AST: 40 U/L (ref 15–41)
Albumin: 4.2 g/dL (ref 3.5–5.0)
Alkaline Phosphatase: 44 U/L (ref 38–126)
Anion gap: 12 (ref 5–15)
BUN: 13 mg/dL (ref 6–20)
CHLORIDE: 106 mmol/L (ref 101–111)
CO2: 22 mmol/L (ref 22–32)
CREATININE: 0.71 mg/dL (ref 0.44–1.00)
Calcium: 9.4 mg/dL (ref 8.9–10.3)
GFR calc Af Amer: >60 mL/min (ref >60)
GFR calc non Af Amer: >60 mL/min (ref >60)
Glucose, Bld: 125 mg/dL — ABNORMAL HIGH (ref 65–99)
POTASSIUM: 4.2 mmol/L (ref 3.5–5.1)
SODIUM: 140 mmol/L (ref 135–145)
Total Bilirubin: 1.5 mg/dL — ABNORMAL HIGH (ref 0.3–1.2)
Total Protein: 7.2 g/dL (ref 6.5–8.1)

## 2017-12-25 LAB — TROPONIN I: Troponin I: <0.03 ng/mL (ref <0.03)

## 2017-12-25 LAB — I-STAT TROPONIN, ED: Troponin i, poc: 0.01 ng/mL (ref 0.00–0.08)

## 2017-12-25 LAB — PROTIME-INR
INR: 1.11
PROTHROMBIN TIME: 14.2 s (ref 11.4–15.2)

## 2017-12-25 LAB — LIPASE, BLOOD: LIPASE: 22 U/L (ref 11–51)

## 2017-12-25 MED ORDER — TETANUS-DIPHTH-ACELL PERTUSSIS 5-2.5-18.5 LF-MCG/0.5 IM SUSP
0.5000 mL | Freq: Once | INTRAMUSCULAR | Status: AC
  Administered 2017-12-25: 0.5 mL via INTRAMUSCULAR
  Filled 2017-12-25: qty 0.5

## 2017-12-25 MED ORDER — CEPHALEXIN 500 MG PO CAPS: 500.0000 mg | ORAL_CAPSULE | Freq: Three times a day (TID) | ORAL | 0 refills | Status: AC

## 2017-12-25 MED ORDER — CEPHALEXIN 250 MG PO CAPS
500.0000 mg | ORAL_CAPSULE | Freq: Once | ORAL | Status: AC
  Administered 2017-12-25: 500 mg via ORAL
  Filled 2017-12-25: qty 2

## 2017-12-25 MED ORDER — SODIUM CHLORIDE 0.9 % IV BOLUS (SEPSIS)
500.0000 mL | Freq: Once | INTRAVENOUS | Status: AC
  Administered 2017-12-25: 500 mL via INTRAVENOUS

## 2017-12-25 NOTE — ED Provider Notes (Signed)
[facility] EMERGENCY DEPARTMENT Provider Note   CSN: [id number] Arrival date & time: 12/25/17  0535     History   Chief Complaint Chief Complaint  Patient presents with  . Fall  . Loss of Consciousness    HPI [name] is a 84 y.o. female with history of A. fib with pacemaker and on Pradaxa, generalized anxiety disorder, chronic right-sided low back pain with right-sided sciatica, breast cancer, GERD, hypertension presents today for evaluation of unwitnessed fall.  Patient's granddaughter states that she went to bed at around 10 PM per usual and her husband found the patient laying on the floor in a supine position at around 4:30 AM crying out for help.  Patient states that she thinks that she was attempting to stand and move from one side of her bed to the other but is unsure of what occurred.  She is unsure how long she may have been unconscious.  She denies chest pain or shortness of breath but endorses aching left shoulder pain and neck pain which does not radiate.  Worsens with movement.  She denies numbness, tingling, or weakness but does take gabapentin for baseline neuropathy of her lower extremities.  Of note, patient's granddaughter states that she had an episode of nausea, vomiting, and diarrhea which lasted approximately 5 hours Saturday evening into Sunday morning yesterday.  She states that this occurred after eating sausage, gravy, and left over burrito from a [employer].  She had multiple episodes of nonbloody nonbilious emesis and watery nonbloody stools, but this has since resolved.  No fevers or chills.  Patient currently denies abdominal pain, urinary symptoms, or nausea.  No melena, hematochezia, or hematemesis.  Patient's granddaughter also states that yesterday and today she seemed more confused and was asking questions and appropriately to family members as well as opening her blinds in the middle of the night.  The history is provided by  the patient and a relative.    Past Medical History:  Diagnosis Date  . Arthritis   . Cancer ([place])   . Hyperlipidemia   . Hypertension   . Pacemaker 2010   [place]  . Pelvic fracture ([place]) 2010  . Thyroid disease     Patient Active Problem List   Diagnosis Date Noted  . Generalized anxiety disorder 01/02/2017  . Chronic right-sided low back pain with right-sided sciatica 01/02/2017  . Spondylosis without myelopathy or radiculopathy, lumbar region 01/02/2017  . Lumbar radiculopathy 01/02/2017  . Cancer of left breast T1b NX S/P lumpectomy 05/2010, Arimidex, no radiation 06/03/2011    Past Surgical History:  Procedure Laterality Date  . ABDOMINAL HYSTERECTOMY  35 yrs ago  . BREAST LUMPECTOMY  05/23/10   left  . CARDIAC DEFIBRILLATOR PLACEMENT  2 years ago  . CHOLECYSTECTOMY  50 YEARS AGO  . ELBOW SURGERY  2006    OB History    No data available       Home Medications    Prior to Admission medications   Medication Sig Start Date End Date Taking? Authorizing Provider  anastrozole (ARIMIDEX) 1 MG tablet Take 1 tablet (1 mg total) by mouth daily. 06/07/12   Eston Esters, MD  aspirin 81 MG tablet Take 81 mg by mouth daily.      [provider]  calcium carbonate (OS-CAL) 600 MG TABS Take 600 mg by mouth 2 (two) times daily with a meal.      [provider]  cephALEXin (KEFLEX) 500 MG capsule  Take 1 capsule (500 mg total) by mouth 3 (three) times daily for 7 days. 12/25/17 01/01/18  Rodell Perna A, PA-C  Cholecalciferol (VITAMIN D) 400 UNIT/ML LIQD Take 400 Units by mouth daily.      [provider]  furosemide (LASIX) 40 MG tablet Take 40 mg by mouth. 07/01/15   [provider]  gabapentin (NEURONTIN) 300 MG capsule TAKE 1 CAPSULE BY MOUTH AT NIGHT. GOAL IS 300 MG TWICE A DAY OR 3 TIMES A DAY. 09/18/17   Magnus Sinning, MD  levothyroxine (SYNTHROID, LEVOTHROID) 88 MCG tablet Take 88 mcg by mouth daily.      [provider]    magnesium oxide (MAG-OX) 400 MG tablet Take 400 mg by mouth daily.      [provider]  metoprolol succinate (TOPROL-XL) 25 MG 24 hr tablet Take 25 mg by mouth daily. One in am one in pm     [provider]  Multiple Vitamins-Minerals (ABC PLUS PO) Take by mouth daily.      [provider]  pantoprazole (PROTONIX) 40 MG tablet  09/19/16   [provider]  PARoxetine (PAXIL) 20 MG tablet Take 20 mg by mouth every morning.      [provider]  potassium chloride (MICRO-K) 10 MEQ CR capsule  09/12/16   [provider]  PRADAXA 75 MG CAPS capsule  07/21/16   [provider]  vitamin C (ASCORBIC ACID) 500 MG tablet Take 500 mg by mouth daily.      [provider]  zolpidem (AMBIEN CR) 12.5 MG CR tablet Take 10 mg by mouth at bedtime as needed.      [provider]    Family History Family History  Problem Relation Age of Onset  . Heart disease Mother   . Diabetes Father   . Stroke Father   . Cancer Sister        lung  . Cancer Brother        liver  . Pancreatitis Brother     Social History Social History   Tobacco Use  . Smoking status: Never Smoker  . Smokeless tobacco: Never Used  Substance Use Topics  . Alcohol use: No  . Drug use: Not on file     Allergies   Codeine   Review of Systems Review of Systems  Constitutional: Negative for chills and fever.  Eyes: Negative for visual disturbance.  Respiratory: Negative for shortness of breath.   Cardiovascular: Negative for chest pain.  Gastrointestinal: Positive for diarrhea, nausea and vomiting. Negative for abdominal pain and blood in stool.  Neurological: Positive for syncope and headaches. Negative for numbness.  Psychiatric/Behavioral: Positive for confusion.  All other systems reviewed and are negative.    Physical Exam Updated Vital Signs BP (!) 148/70   Pulse 83   Temp 98.6 F (37 C) (Oral)   Resp 15   Ht 5\' 4"  (1.626 m)    Wt 87.1 kg (192 lb)   SpO2 99%   BMI 32.96 kg/m   Physical Exam  Constitutional: She appears well-developed and well-nourished. No distress.  HENT:  Head: Normocephalic.  Right Ear: External ear normal.  Left Ear: External ear normal.  No Battle's signs, no raccoon's eyes, no rhinorrhea. No hemotympanum. No tenderness to palpation of the face or skull. No crepitus, or swelling noted.  She has a 1 cm linear laceration noted the skull, bleeding controlled.  No underlying swelling or crepitus.  Nontender to palpation.  Eyes: Conjunctivae and EOM are normal. Pupils are equal, round, and reactive to light. Right eye exhibits no discharge. Left eye exhibits no discharge.  Neck: Normal range of motion. Neck supple. No JVD present. No tracheal deviation present.  No midline spine TTP, no paraspinal muscle tenderness, no deformity, crepitus, or step-off noted   Cardiovascular: Normal rate and intact distal pulses.  Irregularly irregular rhythm, 2+ radial and DP/PT pulses bl, negative Homan's bl, no calf swelling or tenderness noted   Pulmonary/Chest: Effort normal and breath sounds normal. She exhibits tenderness.  Lower anterior parasternal chest wall tenderness on palpation near the sternum/xiphoid process.  No crepitus, deformity, or paradoxical wall motion.  Equal rise and fall of chest, no increased work of breathing  Abdominal: Soft. Bowel sounds are normal. She exhibits no distension. There is tenderness.  Tenderness to palpation in the left upper quadrant and epigastric region.  Murphy sign absent, Rovsing's absent, no CVA tenderness.  Musculoskeletal: She exhibits no edema.  Neurological: She is alert. No cranial nerve deficit or sensory deficit. She exhibits normal muscle tone.  Mental Status:  Alert, thought content appropriate, oriented to person and place as well as the month but unable to tell me what day of the week it is.  She is unable to give a coherent history regarding the  events precipitating presentation to the ED. Speech fluent without evidence of aphasia. Able to follow 2 step commands without difficulty.  Cranial Nerves:  II:  Peripheral visual fields grossly normal, pupils equal, round, reactive to light III,IV, VI: ptosis not present, extra-ocular motions intact bilaterally  V,VII: smile symmetric, facial light touch sensation equal VIII: hearing grossly normal to voice  X: uvula elevates symmetrically  XI: bilateral shoulder shrug symmetric and strong XII: midline tongue extension without fassiculations Motor:  Normal tone. 5/5 strength of BUE and BLE major muscle groups including strong and equal grip strength and dorsiflexion/plantar flexion Sensory: light touch normal in all extremities. Gait: Patient able to pull herself up to a standing position without difficulty.  Ambulates with normal gait and good balance, requiring minimal effort from myself.  She ambulates with a cane at baseline. CV: 2+ radial and DP/PT pulses   Skin: Skin is warm and dry. No erythema.  Psychiatric: She has a normal mood and affect. Her behavior is normal.  Nursing note and vitals reviewed.    ED Treatments / Results  Labs (all labs ordered are listed, but only abnormal results are displayed) Labs Reviewed  CBC WITH DIFFERENTIAL/PLATELET - Abnormal; Notable for the following components:      Result Value   Hemoglobin 15.1 (*)    Neutro Abs 8.5 (*)    All other components within normal limits  COMPREHENSIVE METABOLIC PANEL - Abnormal; Notable for the following components:   Glucose, Bld 125 (*)    Total Bilirubin 1.5 (*)    All other components within normal limits  URINALYSIS, ROUTINE W REFLEX MICROSCOPIC - Abnormal; Notable for the following components:   Color, Urine AMBER (*)    APPearance CLOUDY (*)    Ketones, ur 80 (*)    Protein, ur 30 (*)    Leukocytes, UA LARGE (*)    Bacteria, UA FEW (*)    Squamous Epithelial / LPF 6-30 (*)    All other  components within normal limits  URINE CULTURE  PROTIME-INR  TROPONIN I  LIPASE, BLOOD  I-STAT TROPONIN, ED    EKG  EKG Interpretation None  Radiology Dg Chest 2 View  Result Date: 12/25/2017 CLINICAL DATA:  Chest pain.  Hypertension. EXAM: CHEST  2 VIEW COMPARISON:  February 08, 2016 FINDINGS: There is no edema or consolidation. Heart is mildly enlarged with pulmonary vascularity within normal limits. Pacemaker leads are attached to the right atrium and right ventricle. No adenopathy. There is aortic atherosclerosis. Bones are osteoporotic with several endplate concavities consistent with osteoporotic type change. IMPRESSION: No edema or consolidation. Stable cardiac silhouette. There is aortic atherosclerosis. Bones osteoporotic. Aortic Atherosclerosis (ICD10-I70.0). Electronically Signed   By: Lowella Grip III M.D.   On: 12/25/2017 07:33   Ct Head Wo Contrast  Result Date: 12/25/2017 CLINICAL DATA:  Post unwitnessed fall. Found on floor beside bed. Patient reports cervical neck pain. EXAM: CT HEAD WITHOUT CONTRAST CT CERVICAL SPINE WITHOUT CONTRAST TECHNIQUE: Multidetector CT imaging of the head and cervical spine was performed following the standard protocol without intravenous contrast. Multiplanar CT image reconstructions of the cervical spine were also generated. COMPARISON:  Head CT 12/15/2014 FINDINGS: CT HEAD FINDINGS Brain: Age-related atrophy and chronic small vessel ischemia. No intracranial hemorrhage, mass effect, or midline shift. No hydrocephalus. The basilar cisterns are patent. No evidence of territorial infarct or acute ischemia. No extra-axial or intracranial fluid collection. Vascular: Atherosclerosis of skullbase vasculature without hyperdense vessel or abnormal calcification. Skull: No fracture or focal lesion. Sinuses/Orbits: Mild chronic mucosal thickening of right-sided sphenoid sinus and ethmoid air cells. No sinus fluid levels. Mastoid air cells are clear.  Bilateral cataract resection. Other: None. CT CERVICAL SPINE FINDINGS Alignment: Mild straightening of normal lordosis. No traumatic subluxation. Skull base and vertebrae: No acute fracture. Vertebral body heights are maintained. The dens and skull base are intact. Soft tissues and spinal canal: No prevertebral fluid or swelling. No visible canal hematoma. Disc levels:  Mild disc space narrowing at C4-C5 and C5-C6. Upper chest: No acute finding. Other: Carotid calcifications. IMPRESSION: 1.  No acute intracranial abnormality.  No skull fracture. 2. Atrophy and chronic small vessel ischemia, normal for age. 3. Mild degenerative change in the cervical spine without acute fracture or subluxation. Electronically Signed   By: Jeb Levering M.D.   On: 12/25/2017 06:28   Ct Cervical Spine Wo Contrast  Result Date: 12/25/2017 CLINICAL DATA:  Post unwitnessed fall. Found on floor beside bed. Patient reports cervical neck pain. EXAM: CT HEAD WITHOUT CONTRAST CT CERVICAL SPINE WITHOUT CONTRAST TECHNIQUE: Multidetector CT imaging of the head and cervical spine was performed following the standard protocol without intravenous contrast. Multiplanar CT image reconstructions of the cervical spine were also generated. COMPARISON:  Head CT 12/15/2014 FINDINGS: CT HEAD FINDINGS Brain: Age-related atrophy and chronic small vessel ischemia. No intracranial hemorrhage, mass effect, or midline shift. No hydrocephalus. The basilar cisterns are patent. No evidence of territorial infarct or acute ischemia. No extra-axial or intracranial fluid collection. Vascular: Atherosclerosis of skullbase vasculature without hyperdense vessel or abnormal calcification. Skull: No fracture or focal lesion. Sinuses/Orbits: Mild chronic mucosal thickening of right-sided sphenoid sinus and ethmoid air cells. No sinus fluid levels. Mastoid air cells are clear. Bilateral cataract resection. Other: None. CT CERVICAL SPINE FINDINGS Alignment: Mild  straightening of normal lordosis. No traumatic subluxation. Skull base and vertebrae: No acute fracture. Vertebral body heights are maintained. The dens and skull base are intact. Soft tissues and spinal canal: No prevertebral fluid or swelling. No visible canal hematoma. Disc levels:  Mild disc space narrowing at C4-C5 and C5-C6. Upper chest: No acute finding. Other: Carotid calcifications. IMPRESSION: 1.  No acute intracranial abnormality.  No skull fracture. 2. Atrophy and chronic small vessel ischemia, normal for age. 3. Mild degenerative change in the cervical spine without acute fracture or subluxation. Electronically Signed   By: Jeb Levering M.D.   On: 12/25/2017 06:28   Dg Shoulder Left  Result Date: 12/25/2017 CLINICAL DATA:  Left shoulder pain. EXAM: LEFT SHOULDER - 2+ VIEW COMPARISON:  None. FINDINGS: There is no evidence of fracture or dislocation. There is no evidence of arthropathy or other focal bone abnormality. Soft tissues are unremarkable. Left-sided pacemaker is partially included. IMPRESSION: Negative radiographs of the left shoulder. Electronically Signed   By: Jeb Levering M.D.   On: 12/25/2017 06:45    Procedures .Marland KitchenLaceration Repair Date/Time: 12/25/2017 12:02 PM Performed by: Renita Papa, PA-C Authorized by: Renita Papa, PA-C   Consent:    Consent obtained:  Verbal   Consent given by:  Patient   Risks discussed:  Infection, pain and poor cosmetic result Anesthesia (see MAR for exact dosages):    Anesthesia method:  None Laceration details:    Location:  Scalp   Scalp location:  Crown   Length (cm):  1   Depth (mm):  2 Repair type:    Repair type:  Simple Pre-procedure details:    Preparation:  Patient was prepped and draped in usual sterile fashion and imaging obtained to evaluate for foreign bodies Exploration:    Hemostasis achieved with:  Direct pressure   Wound exploration: wound explored through full range of motion and entire depth of wound  probed and visualized     Wound extent: areolar tissue violated     Wound extent: no underlying fracture noted     Contaminated: no   Treatment:    Area cleansed with:  Saline and Shur-Clens   Amount of cleaning:  Standard   Irrigation method:  Syringe   Visualized foreign bodies/material removed: no   Skin repair:    Repair method:  Tissue adhesive Approximation:    Approximation:  Close   Vermilion border: well-aligned   Post-procedure details:    Dressing:  Open (no dressing)   Patient tolerance of procedure:  Tolerated well, no immediate complications   (including critical care time)  Medications Ordered in ED Medications  Tdap (BOOSTRIX) injection 0.5 mL (0.5 mLs Intramuscular Given 12/25/17 1144)  sodium chloride 0.9 % bolus 500 mL (0 mLs Intravenous Stopped 12/25/17 1145)  cephALEXin (KEFLEX) capsule 500 mg (500 mg Oral Given 12/25/17 1144)     Initial Impression / Assessment and Plan / ED Course  I have reviewed the triage vital signs and the nursing notes.  Pertinent labs & imaging results that were available during my care of the patient were reviewed by me and considered in my medical decision making (see chart for details).     Patient presents for evaluation after unwitnessed fall.  She is unable to give a coherent history regarding the events surrounding her fall but believes she may have lost consciousness.  Afebrile, vital signs are stable.  She is nontoxic in appearance.  She is generally oriented and answering questions appropriately, following commands appropriately, however family states that she is somewhat confused.  No focal neurologic deficits on examination.  Imaging of the head, neck, shoulder, and chest show no acute fracture or dislocation.  Bones of the chest are osteoporotic but no evidence of definitive fracture.  She exhibits no increased work of breathing and no hypoxia.  Normal range of motion of the  left shoulder.  She is ambulatory without  difficulty.  She typically ambulates at baseline with a cane.  No leukocytosis, no significant electrolyte abnormalities.  Serial troponins are negative and I doubt ACS or MI.  UA is somewhat concerning for UTI which could expand the patient's confusion.  On reevaluation she is resting comfortably, answering questions appropriately and following commands without difficulty.  Urine was cultured.  We will initiate Keflex for UTI.  Patient was also given fluid resuscitation and family is concerned that she may be dehydrated given recent history of vomiting and diarrhea.  Abdominal examination showed mild epigastric and left upper quadrant abdominal pain initially with resolution on reevaluation.  Doubt obstruction, perforation, appendicitis, colitis, AAA, or other acute surgical abdominal pathology.  Lipase was within normal limits and patient was able to tolerate p.o. food and fluids while in the ED without difficulty.  I doubt acute pancreatitis.  Patient also presented with 1 cm scalp laceration to the crown of the skull.  Bleeding controlled.  Wound was extensively cleaned and a base of wound was visualized in a bloodless field.  Wound was amenable to closure with Dermabond and hair apposition.  Tetanus was updated.  Will discharge with Keflex for UTI.  Recommend close follow-up with primary care physician in the next 24-48 hours for reevaluation.  Discussed indications for return to the ED.  Patient and patient's family verbalized understanding of and agreement with plan and patient stable for discharge home at this time.  Patient seen and evaluated by Dr. Dina Rich who agrees with assessment and plan.   Final Clinical Impressions(s) / ED Diagnoses   Final diagnoses:  Syncope and collapse  Fall, initial encounter  Laceration of scalp, initial encounter  Acute cystitis with hematuria    ED Discharge Orders        Ordered    cephALEXin (KEFLEX) 500 MG capsule  3 times daily     12/25/17 1140         Renita Papa, PA-C 12/25/17 1600    Merryl Hacker, MD 12/26/17 [phone]

## 2017-12-25 NOTE — Discharge Instructions (Signed)
1. Medications: Tylenol or ibuprofen for pain, Please take all of your antibiotics until finished!   You may develop abdominal discomfort or diarrhea from the antibiotic.  You may help offset this with probiotics which you can buy or get in yogurt. Do not eat  or take the probiotics until 2 hours after your antibiotic.  2. Treatment: ice for swelling, keep wound clean with warm soap and water and keep bandage dry, do not submerge in water for 24 hours.  Do not apply antibiotic ointment to the affected area.  Pat the area dry after showers. 3. Follow Up: Follow-up with primary care physician in the next 48-72 hours. Return to the emergency department for increased redness, drainage of pus from the wound, worsening mental status, or loss of consciousness   WOUND CARE  Keep area clean and dry for 24 hours. Do not remove bandage, if applied.  After 24 hours, remove bandage and wash wound gently with mild soap and warm water. Reapply a new bandage after cleaning wound, if directed.   Continue daily cleansing with soap and water until stitches/staples are removed.  Do not apply any ointments or creams to the wound while stitches/staples are in place, as this may cause delayed healing. Return if you experience any of the following signs of infection: Swelling, redness, pus drainage, streaking, fever >101.0 F  Return if you experience excessive bleeding that does not stop after 15-20 minutes of constant, firm pressure.

## 2017-12-25 NOTE — ED Triage Notes (Signed)
Brought by ems from home after having an unwitnessed fall.  Per family patient was found lying on the floor beside the bed yelling for help.  Patient reports remembering getting out of bed but woke on the floor.  C/o neck pain, left shoulder pain, and small lac to right side of head.  Family reports patient had n/v/d all day Sunday.

## 2017-12-25 NOTE — ED Notes (Signed)
Patient verbalizes understanding of discharge instructions. Opportunity for questioning and answers were provided. Armband removed by staff, pt discharged from ED via wheelchair.  

## 2017-12-26 LAB — URINE CULTURE

## 2017-12-27 ENCOUNTER — Encounter (INDEPENDENT_AMBULATORY_CARE_PROVIDER_SITE_OTHER): Payer: PPO | Admitting: Physical Medicine and Rehabilitation

## 2018-01-02 DIAGNOSIS — K529 Noninfective gastroenteritis and colitis, unspecified: Secondary | ICD-10-CM | POA: Diagnosis not present

## 2018-01-04 DIAGNOSIS — Z95810 Presence of automatic (implantable) cardiac defibrillator: Secondary | ICD-10-CM | POA: Diagnosis not present

## 2018-01-25 DIAGNOSIS — M810 Age-related osteoporosis without current pathological fracture: Secondary | ICD-10-CM | POA: Diagnosis not present

## 2018-01-25 DIAGNOSIS — G4700 Insomnia, unspecified: Secondary | ICD-10-CM | POA: Diagnosis not present

## 2018-01-25 DIAGNOSIS — R251 Tremor, unspecified: Secondary | ICD-10-CM | POA: Diagnosis not present

## 2018-01-25 DIAGNOSIS — Z1389 Encounter for screening for other disorder: Secondary | ICD-10-CM | POA: Diagnosis not present

## 2018-01-25 DIAGNOSIS — Z0000 Encounter for general adult medical examination without abnormal findings: Secondary | ICD-10-CM | POA: Diagnosis not present

## 2018-01-25 DIAGNOSIS — E7800 Pure hypercholesterolemia, unspecified: Secondary | ICD-10-CM | POA: Diagnosis not present

## 2018-01-25 DIAGNOSIS — I10 Essential (primary) hypertension: Secondary | ICD-10-CM | POA: Diagnosis not present

## 2018-01-25 DIAGNOSIS — F329 Major depressive disorder, single episode, unspecified: Secondary | ICD-10-CM | POA: Diagnosis not present

## 2018-01-25 DIAGNOSIS — I4891 Unspecified atrial fibrillation: Secondary | ICD-10-CM | POA: Diagnosis not present

## 2018-01-25 DIAGNOSIS — Z95810 Presence of automatic (implantable) cardiac defibrillator: Secondary | ICD-10-CM | POA: Diagnosis not present

## 2018-01-25 DIAGNOSIS — F339 Major depressive disorder, recurrent, unspecified: Secondary | ICD-10-CM | POA: Diagnosis not present

## 2018-01-25 DIAGNOSIS — R7301 Impaired fasting glucose: Secondary | ICD-10-CM | POA: Diagnosis not present

## 2018-01-25 DIAGNOSIS — E039 Hypothyroidism, unspecified: Secondary | ICD-10-CM | POA: Diagnosis not present

## 2018-02-28 DIAGNOSIS — Z4502 Encounter for adjustment and management of automatic implantable cardiac defibrillator: Secondary | ICD-10-CM | POA: Diagnosis not present

## 2018-02-28 DIAGNOSIS — I472 Ventricular tachycardia: Secondary | ICD-10-CM | POA: Diagnosis not present

## 2018-06-05 DIAGNOSIS — I480 Paroxysmal atrial fibrillation: Secondary | ICD-10-CM | POA: Diagnosis not present

## 2018-06-05 DIAGNOSIS — I10 Essential (primary) hypertension: Secondary | ICD-10-CM | POA: Diagnosis not present

## 2018-06-05 DIAGNOSIS — I472 Ventricular tachycardia: Secondary | ICD-10-CM | POA: Diagnosis not present

## 2018-06-05 DIAGNOSIS — H903 Sensorineural hearing loss, bilateral: Secondary | ICD-10-CM | POA: Diagnosis not present

## 2018-06-05 DIAGNOSIS — Z95810 Presence of automatic (implantable) cardiac defibrillator: Secondary | ICD-10-CM | POA: Diagnosis not present

## 2018-06-13 DIAGNOSIS — Z85828 Personal history of other malignant neoplasm of skin: Secondary | ICD-10-CM | POA: Diagnosis not present

## 2018-06-13 DIAGNOSIS — D692 Other nonthrombocytopenic purpura: Secondary | ICD-10-CM | POA: Diagnosis not present

## 2018-06-13 DIAGNOSIS — L304 Erythema intertrigo: Secondary | ICD-10-CM | POA: Diagnosis not present

## 2018-07-31 DIAGNOSIS — M810 Age-related osteoporosis without current pathological fracture: Secondary | ICD-10-CM | POA: Diagnosis not present

## 2018-07-31 DIAGNOSIS — Z23 Encounter for immunization: Secondary | ICD-10-CM | POA: Diagnosis not present

## 2018-07-31 DIAGNOSIS — I10 Essential (primary) hypertension: Secondary | ICD-10-CM | POA: Diagnosis not present

## 2018-07-31 DIAGNOSIS — E039 Hypothyroidism, unspecified: Secondary | ICD-10-CM | POA: Diagnosis not present

## 2018-07-31 DIAGNOSIS — E7800 Pure hypercholesterolemia, unspecified: Secondary | ICD-10-CM | POA: Diagnosis not present

## 2018-07-31 DIAGNOSIS — Z95810 Presence of automatic (implantable) cardiac defibrillator: Secondary | ICD-10-CM | POA: Diagnosis not present

## 2018-08-06 DIAGNOSIS — F329 Major depressive disorder, single episode, unspecified: Secondary | ICD-10-CM | POA: Diagnosis not present

## 2018-08-06 DIAGNOSIS — E782 Mixed hyperlipidemia: Secondary | ICD-10-CM | POA: Diagnosis not present

## 2018-08-06 DIAGNOSIS — F339 Major depressive disorder, recurrent, unspecified: Secondary | ICD-10-CM | POA: Diagnosis not present

## 2018-08-06 DIAGNOSIS — I10 Essential (primary) hypertension: Secondary | ICD-10-CM | POA: Diagnosis not present

## 2018-08-06 DIAGNOSIS — I4891 Unspecified atrial fibrillation: Secondary | ICD-10-CM | POA: Diagnosis not present

## 2018-08-06 DIAGNOSIS — E039 Hypothyroidism, unspecified: Secondary | ICD-10-CM | POA: Diagnosis not present

## 2018-08-06 DIAGNOSIS — C50912 Malignant neoplasm of unspecified site of left female breast: Secondary | ICD-10-CM | POA: Diagnosis not present

## 2018-08-06 DIAGNOSIS — M810 Age-related osteoporosis without current pathological fracture: Secondary | ICD-10-CM | POA: Diagnosis not present

## 2018-08-27 ENCOUNTER — Other Ambulatory Visit: Payer: Self-pay | Admitting: Internal Medicine

## 2018-08-27 DIAGNOSIS — Z1231 Encounter for screening mammogram for malignant neoplasm of breast: Secondary | ICD-10-CM

## 2018-08-30 DIAGNOSIS — Z95810 Presence of automatic (implantable) cardiac defibrillator: Secondary | ICD-10-CM | POA: Diagnosis not present

## 2018-09-03 DIAGNOSIS — I472 Ventricular tachycardia: Secondary | ICD-10-CM | POA: Diagnosis not present

## 2018-09-03 DIAGNOSIS — Z95810 Presence of automatic (implantable) cardiac defibrillator: Secondary | ICD-10-CM | POA: Diagnosis not present

## 2018-09-03 DIAGNOSIS — Z4502 Encounter for adjustment and management of automatic implantable cardiac defibrillator: Secondary | ICD-10-CM | POA: Diagnosis not present

## 2018-09-12 DIAGNOSIS — L814 Other melanin hyperpigmentation: Secondary | ICD-10-CM | POA: Diagnosis not present

## 2018-09-12 DIAGNOSIS — D1801 Hemangioma of skin and subcutaneous tissue: Secondary | ICD-10-CM | POA: Diagnosis not present

## 2018-09-12 DIAGNOSIS — L821 Other seborrheic keratosis: Secondary | ICD-10-CM | POA: Diagnosis not present

## 2018-09-12 DIAGNOSIS — Z85828 Personal history of other malignant neoplasm of skin: Secondary | ICD-10-CM | POA: Diagnosis not present

## 2018-09-12 DIAGNOSIS — L918 Other hypertrophic disorders of the skin: Secondary | ICD-10-CM | POA: Diagnosis not present

## 2018-10-03 ENCOUNTER — Ambulatory Visit
Admission: RE | Admit: 2018-10-03 | Discharge: 2018-10-03 | Disposition: A | Discharge status: Home / Self Care | Payer: PPO | Source: Ambulatory Visit | Attending: Internal Medicine | Admitting: Internal Medicine

## 2018-10-03 DIAGNOSIS — Z1231 Encounter for screening mammogram for malignant neoplasm of breast: Secondary | ICD-10-CM | POA: Diagnosis not present

## 2018-10-03 HISTORY — DX: Malignant neoplasm of unspecified site of unspecified female breast: C50.919

## 2018-11-05 ENCOUNTER — Other Ambulatory Visit (INDEPENDENT_AMBULATORY_CARE_PROVIDER_SITE_OTHER): Payer: Self-pay | Admitting: Physical Medicine and Rehabilitation

## 2018-11-05 DIAGNOSIS — M5416 Radiculopathy, lumbar region: Secondary | ICD-10-CM

## 2018-11-05 NOTE — Telephone Encounter (Signed)
Please advise 

## 2018-11-28 DIAGNOSIS — Z95810 Presence of automatic (implantable) cardiac defibrillator: Secondary | ICD-10-CM | POA: Diagnosis not present

## 2018-12-11 ENCOUNTER — Encounter (HOSPITAL_COMMUNITY): Payer: Self-pay | Admitting: Emergency Medicine

## 2018-12-11 ENCOUNTER — Other Ambulatory Visit: Payer: Self-pay

## 2018-12-11 ENCOUNTER — Emergency Department (HOSPITAL_COMMUNITY): Payer: PPO

## 2018-12-11 ENCOUNTER — Observation Stay (HOSPITAL_COMMUNITY): Payer: PPO

## 2018-12-11 ENCOUNTER — Observation Stay (HOSPITAL_COMMUNITY)
Admission: EM | Admit: 2018-12-11 | Discharge: 2018-12-12 | Disposition: A | Discharge status: Home / Self Care | Payer: PPO | Attending: Internal Medicine | Admitting: Internal Medicine

## 2018-12-11 DIAGNOSIS — E781 Pure hyperglyceridemia: Secondary | ICD-10-CM

## 2018-12-11 DIAGNOSIS — F411 Generalized anxiety disorder: Secondary | ICD-10-CM | POA: Insufficient documentation

## 2018-12-11 DIAGNOSIS — R29818 Other symptoms and signs involving the nervous system: Principal | ICD-10-CM | POA: Insufficient documentation

## 2018-12-11 DIAGNOSIS — Z6831 Body mass index (BMI) 31.0-31.9, adult: Secondary | ICD-10-CM | POA: Insufficient documentation

## 2018-12-11 DIAGNOSIS — R739 Hyperglycemia, unspecified: Secondary | ICD-10-CM | POA: Diagnosis not present

## 2018-12-11 DIAGNOSIS — R2990 Unspecified symptoms and signs involving the nervous system: Secondary | ICD-10-CM | POA: Diagnosis present

## 2018-12-11 DIAGNOSIS — E785 Hyperlipidemia, unspecified: Secondary | ICD-10-CM | POA: Diagnosis not present

## 2018-12-11 DIAGNOSIS — K219 Gastro-esophageal reflux disease without esophagitis: Secondary | ICD-10-CM

## 2018-12-11 DIAGNOSIS — E669 Obesity, unspecified: Secondary | ICD-10-CM | POA: Insufficient documentation

## 2018-12-11 DIAGNOSIS — R7989 Other specified abnormal findings of blood chemistry: Secondary | ICD-10-CM

## 2018-12-11 DIAGNOSIS — M5441 Lumbago with sciatica, right side: Secondary | ICD-10-CM

## 2018-12-11 DIAGNOSIS — G459 Transient cerebral ischemic attack, unspecified: Secondary | ICD-10-CM

## 2018-12-11 DIAGNOSIS — C50919 Malignant neoplasm of unspecified site of unspecified female breast: Secondary | ICD-10-CM | POA: Diagnosis present

## 2018-12-11 DIAGNOSIS — Z95810 Presence of automatic (implantable) cardiac defibrillator: Secondary | ICD-10-CM

## 2018-12-11 DIAGNOSIS — Z79899 Other long term (current) drug therapy: Secondary | ICD-10-CM | POA: Diagnosis not present

## 2018-12-11 DIAGNOSIS — R4781 Slurred speech: Secondary | ICD-10-CM | POA: Diagnosis present

## 2018-12-11 DIAGNOSIS — E039 Hypothyroidism, unspecified: Secondary | ICD-10-CM

## 2018-12-11 DIAGNOSIS — I10 Essential (primary) hypertension: Secondary | ICD-10-CM | POA: Insufficient documentation

## 2018-12-11 DIAGNOSIS — R05 Cough: Secondary | ICD-10-CM | POA: Insufficient documentation

## 2018-12-11 DIAGNOSIS — G8929 Other chronic pain: Secondary | ICD-10-CM

## 2018-12-11 DIAGNOSIS — I6523 Occlusion and stenosis of bilateral carotid arteries: Secondary | ICD-10-CM | POA: Diagnosis not present

## 2018-12-11 DIAGNOSIS — F419 Anxiety disorder, unspecified: Secondary | ICD-10-CM | POA: Diagnosis not present

## 2018-12-11 DIAGNOSIS — R410 Disorientation, unspecified: Secondary | ICD-10-CM | POA: Diagnosis not present

## 2018-12-11 DIAGNOSIS — Z853 Personal history of malignant neoplasm of breast: Secondary | ICD-10-CM | POA: Diagnosis not present

## 2018-12-11 DIAGNOSIS — I669 Occlusion and stenosis of unspecified cerebral artery: Secondary | ICD-10-CM | POA: Diagnosis not present

## 2018-12-11 DIAGNOSIS — R4702 Dysphasia: Secondary | ICD-10-CM | POA: Diagnosis not present

## 2018-12-11 HISTORY — DX: Unspecified atrial fibrillation: I48.91

## 2018-12-11 LAB — URINALYSIS, ROUTINE W REFLEX MICROSCOPIC
Bilirubin Urine: NEGATIVE
Glucose, UA: NEGATIVE mg/dL
Hgb urine dipstick: NEGATIVE
Ketones, ur: NEGATIVE mg/dL
Leukocytes, UA: NEGATIVE
Nitrite: NEGATIVE
Protein, ur: NEGATIVE mg/dL
Specific Gravity, Urine: 1.009 (ref 1.005–1.030)
pH: 7 (ref 5.0–8.0)

## 2018-12-11 LAB — COMPREHENSIVE METABOLIC PANEL
ALBUMIN: 3.8 g/dL (ref 3.5–5.0)
ALT: 16 U/L (ref 0–44)
AST: 28 U/L (ref 15–41)
Alkaline Phosphatase: 43 U/L (ref 38–126)
Anion gap: 8 (ref 5–15)
BILIRUBIN TOTAL: 0.5 mg/dL (ref 0.3–1.2)
BUN: 13 mg/dL (ref 8–23)
CO2: 24 mmol/L (ref 22–32)
Calcium: 9.4 mg/dL (ref 8.9–10.3)
Chloride: 109 mmol/L (ref 98–111)
Creatinine, Ser: 0.76 mg/dL (ref 0.44–1.00)
GFR calc Af Amer: >60 mL/min (ref >60)
GFR calc non Af Amer: >60 mL/min (ref >60)
Glucose, Bld: 100 mg/dL — ABNORMAL HIGH (ref 70–99)
Potassium: 4.9 mmol/L (ref 3.5–5.1)
Sodium: 141 mmol/L (ref 135–145)
Total Protein: 6.7 g/dL (ref 6.5–8.1)

## 2018-12-11 LAB — CBC WITH DIFFERENTIAL/PLATELET
Abs Immature Granulocytes: 0.03 10*3/uL (ref 0.00–0.07)
BASOS PCT: 0 %
Basophils Absolute: 0 10*3/uL (ref 0.0–0.1)
Eosinophils Absolute: 0.2 10*3/uL (ref 0.0–0.5)
Eosinophils Relative: 2 %
HCT: 41.7 % (ref 36.0–46.0)
Hemoglobin: 13.7 g/dL (ref 12.0–15.0)
Immature Granulocytes: 0 %
Lymphocytes Relative: 24 %
Lymphs Abs: 2.3 10*3/uL (ref 0.7–4.0)
MCH: 31 pg (ref 26.0–34.0)
MCHC: 32.9 g/dL (ref 30.0–36.0)
MCV: 94.3 fL (ref 80.0–100.0)
Monocytes Absolute: 0.7 10*3/uL (ref 0.1–1.0)
Monocytes Relative: 8 %
Neutro Abs: 6.3 10*3/uL (ref 1.7–7.7)
Neutrophils Relative %: 66 %
Platelets: 256 10*3/uL (ref 150–400)
RBC: 4.42 MIL/uL (ref 3.87–5.11)
RDW: 13.3 % (ref 11.5–15.5)
WBC: 9.6 10*3/uL (ref 4.0–10.5)
nRBC: 0 % (ref 0.0–0.2)

## 2018-12-11 LAB — RAPID URINE DRUG SCREEN, HOSP PERFORMED
Amphetamines: NOT DETECTED
Barbiturates: NOT DETECTED
Benzodiazepines: NOT DETECTED
Cocaine: NOT DETECTED
Opiates: NOT DETECTED
Tetrahydrocannabinol: NOT DETECTED

## 2018-12-11 LAB — APTT: APTT: 40 s — AB (ref 24–36)

## 2018-12-11 LAB — VITAMIN B12: Vitamin B-12: 696 pg/mL (ref 180–914)

## 2018-12-11 LAB — BRAIN NATRIURETIC PEPTIDE: B Natriuretic Peptide: 216.6 pg/mL — ABNORMAL HIGH (ref 0.0–100.0)

## 2018-12-11 LAB — I-STAT TROPONIN, ED: Troponin i, poc: 0 ng/mL (ref 0.00–0.08)

## 2018-12-11 LAB — PROTIME-INR
INR: 1.19
Prothrombin Time: 15 seconds (ref 11.4–15.2)

## 2018-12-11 LAB — TSH: TSH: 3.97 u[IU]/mL (ref 0.350–4.500)

## 2018-12-11 LAB — MRSA PCR SCREENING: MRSA by PCR: NEGATIVE

## 2018-12-11 MED ORDER — STROKE: EARLY STAGES OF RECOVERY BOOK
Freq: Once | Status: DC
  Filled 2018-12-11: qty 1

## 2018-12-11 MED ORDER — VITAMIN D 25 MCG (1000 UNIT) PO TABS
1000.0000 [IU] | ORAL_TABLET | ORAL | Status: DC
  Administered 2018-12-12: 1000 [IU] via ORAL
  Filled 2018-12-11: qty 1

## 2018-12-11 MED ORDER — SENNOSIDES-DOCUSATE SODIUM 8.6-50 MG PO TABS: 1.0000 | ORAL_TABLET | Freq: Every evening | ORAL | Status: DC | PRN

## 2018-12-11 MED ORDER — ZOLPIDEM TARTRATE 5 MG PO TABS
5.0000 mg | ORAL_TABLET | Freq: Every day | ORAL | Status: DC
  Administered 2018-12-11: 5 mg via ORAL
  Filled 2018-12-11: qty 1

## 2018-12-11 MED ORDER — ACETAMINOPHEN 160 MG/5ML PO SOLN: 650.0000 mg | ORAL | Status: DC | PRN

## 2018-12-11 MED ORDER — DABIGATRAN ETEXILATE MESYLATE 75 MG PO CAPS
75.0000 mg | ORAL_CAPSULE | Freq: Two times a day (BID) | ORAL | Status: DC
  Administered 2018-12-11 – 2018-12-12 (×2): 75 mg via ORAL
  Filled 2018-12-11 (×3): qty 1

## 2018-12-11 MED ORDER — ACETAMINOPHEN 650 MG RE SUPP: 650.0000 mg | RECTAL | Status: DC | PRN

## 2018-12-11 MED ORDER — PAROXETINE HCL 20 MG PO TABS
20.0000 mg | ORAL_TABLET | ORAL | Status: DC
  Administered 2018-12-12: 20 mg via ORAL
  Filled 2018-12-11: qty 1

## 2018-12-11 MED ORDER — IOPAMIDOL (ISOVUE-370) INJECTION 76%
100.0000 mL | Freq: Once | INTRAVENOUS | Status: AC | PRN
  Administered 2018-12-11: 100 mL via INTRAVENOUS

## 2018-12-11 MED ORDER — LEVOTHYROXINE SODIUM 75 MCG PO TABS
75.0000 ug | ORAL_TABLET | ORAL | Status: DC
  Administered 2018-12-12: 75 ug via ORAL
  Filled 2018-12-11: qty 1

## 2018-12-11 MED ORDER — CALCIUM-VITAMIN D 500-200 MG-UNIT PO TABS
1.0000 | ORAL_TABLET | ORAL | Status: DC
  Filled 2018-12-11 (×2): qty 1

## 2018-12-11 MED ORDER — ACETAMINOPHEN 500 MG PO TABS: 1000.0000 mg | ORAL_TABLET | Freq: Every day | ORAL | Status: DC | PRN

## 2018-12-11 MED ORDER — PANTOPRAZOLE SODIUM 40 MG PO TBEC
40.0000 mg | DELAYED_RELEASE_TABLET | Freq: Every day | ORAL | Status: DC
  Administered 2018-12-11 – 2018-12-12 (×2): 40 mg via ORAL
  Filled 2018-12-11 (×2): qty 1

## 2018-12-11 MED ORDER — METOPROLOL SUCCINATE ER 25 MG PO TB24
25.0000 mg | ORAL_TABLET | Freq: Every day | ORAL | Status: DC
  Administered 2018-12-12: 25 mg via ORAL
  Filled 2018-12-11: qty 1

## 2018-12-11 MED ORDER — MICONAZOLE NITRATE 2 % EX POWD: 1.0000 "application " | CUTANEOUS | Status: DC | PRN

## 2018-12-11 MED ORDER — POTASSIUM CHLORIDE ER 10 MEQ PO TBCR
10.0000 meq | EXTENDED_RELEASE_TABLET | ORAL | Status: DC
  Administered 2018-12-12: 10 meq via ORAL
  Filled 2018-12-11 (×3): qty 1

## 2018-12-11 MED ORDER — ATORVASTATIN CALCIUM 80 MG PO TABS
80.0000 mg | ORAL_TABLET | Freq: Every day | ORAL | Status: DC
  Administered 2018-12-11: 80 mg via ORAL
  Filled 2018-12-11: qty 1

## 2018-12-11 MED ORDER — ACETAMINOPHEN 325 MG PO TABS: 650.0000 mg | ORAL_TABLET | ORAL | Status: DC | PRN

## 2018-12-11 MED ORDER — IOPAMIDOL (ISOVUE-370) INJECTION 76%
INTRAVENOUS | Status: AC
  Filled 2018-12-11: qty 100

## 2018-12-11 MED ORDER — GABAPENTIN 600 MG PO TABS: 300.0000 mg | ORAL_TABLET | Freq: Two times a day (BID) | ORAL | Status: DC | PRN

## 2018-12-11 NOTE — ED Provider Notes (Signed)
[facility] EMERGENCY DEPARTMENT Provider Note   CSN: [id number] Arrival date & time: 12/11/18  1050     History   Chief Complaint Chief Complaint  Patient presents with  . Weakness  . Altered Mental Status  . Aphasia    HPI [name] is a 85 y.o. female.  HPI  85 year old female presents with an episode of slurred speech.  This morning around 9 AM she was at breakfast and was sipping on prune juice and coffee.  Started to talk to the people at the table but then noticed they were looking at her funny and apparently she was having slurred speech.  She knew she did not feel well so she got up to walk to her room with her cane.  She felt off balance and witnesses states she was swaying side to side.  Went to her bed and then EMS was called.  Since EMS has been with her, she has been acting normal.  The speech is resolved and she feels fine.  No headache or chest pain.  She has felt like she is had a little bit of mild cough and sore throat for about a week.  Never had focal weakness. No syncope.  Daughter tells me she is feeling fine at this time.  The patient has had a little bit of trouble speaking over the phone on and off over the last couple weeks, about once per week.  Past Medical History:  Diagnosis Date  . Arthritis   . Breast cancer [facility]) 2011   Left Breast Cancer  . Cancer [facility]) 2011   Left Breast Cancer  . Hyperlipidemia   . Hypertension   . Pacemaker 2010   [place]  . Pelvic fracture ([place]) 2010  . Thyroid disease     Patient Active Problem List   Diagnosis Date Noted  . Stroke-like symptoms 12/11/2018  . Essential hypertension 12/11/2018  . Hyperlipidemia 12/11/2018  . Hypothyroidism 12/11/2018  . AICD (automatic cardioverter/defibrillator) present 12/11/2018  . GERD (gastroesophageal reflux disease) 12/11/2018  . Anxiety 12/11/2018  . Elevated brain natriuretic peptide (BNP) level 12/11/2018  . Hyperglycemia 12/11/2018  .  Generalized anxiety disorder 01/02/2017  . Chronic right-sided low back pain with right-sided sciatica 01/02/2017  . Spondylosis without myelopathy or radiculopathy, lumbar region 01/02/2017  . Lumbar radiculopathy 01/02/2017  . Cancer of left breast T1b NX S/P lumpectomy 05/2010, Arimidex, no radiation 06/03/2011    Past Surgical History:  Procedure Laterality Date  . ABDOMINAL HYSTERECTOMY  35 yrs ago  . BREAST LUMPECTOMY Left 05/23/10  . CARDIAC DEFIBRILLATOR PLACEMENT  2 years ago  . CHOLECYSTECTOMY  50 YEARS AGO  . ELBOW SURGERY  2006     OB History   No obstetric history on file.      Home Medications    Prior to Admission medications   Medication Sig Start Date End Date Taking? Authorizing Provider  acetaminophen (TYLENOL) 500 MG tablet Take 1,000 mg by mouth daily as needed for mild pain.   Yes [provider]  Calcium Carb-Cholecalciferol (CALCIUM+D3) 600-800 MG-UNIT TABS Take 1 tablet by mouth every morning.   Yes [provider]  calcium carbonate (OS-CAL) 600 MG TABS Take 600 mg by mouth daily with breakfast.    Yes [provider]  cholecalciferol (VITAMIN D3) 25 MCG (1000 UT) tablet Take 1,000 Units by mouth every morning.    Yes [provider]  furosemide (LASIX) 40 MG tablet Take 40 mg by mouth  daily as needed for fluid.  07/01/15  Yes [provider]  gabapentin (NEURONTIN) 600 MG tablet Take 0.5 tablets (300 mg total) by mouth 3 (three) times daily as needed. Patient taking differently: Take 300 mg by mouth 2 (two) times daily as needed (pain).  11/05/18  Yes Magnus Sinning, MD  ketoconazole (NIZORAL) 2 % cream Apply 1 application topically 2 (two) times daily as needed (for 2 weeks as needed for flares).   Yes [provider]  metoprolol succinate (TOPROL-XL) 25 MG 24 hr tablet Take 25 mg by mouth daily. One in am one in pm    Yes [provider]  miconazole (ZEASORB-AF) 2 % powder Apply 1 application  topically as needed for itching (under the breasts).   Yes [provider]  Multiple Vitamins-Minerals (ABC PLUS PO) Take by mouth daily.     Yes [provider]  Omega-3 Fatty Acids (FISH OIL) 1200 MG CAPS Take 2,400 mg by mouth every morning.   Yes [provider]  pantoprazole (PROTONIX) 40 MG tablet Take 40 mg by mouth daily.  09/19/16  Yes [provider]  PARoxetine (PAXIL) 20 MG tablet Take 20 mg by mouth every morning.     Yes [provider]  potassium chloride (K-DUR) 10 MEQ tablet Take 10 mEq by mouth every morning.  09/12/16  Yes [provider]  PRADAXA 75 MG CAPS capsule Take 75 mg by mouth 2 (two) times daily.  07/21/16  Yes [provider]  SYNTHROID 75 MCG tablet Take 75 mcg by mouth every morning. 11/28/18  Yes [provider]  zolpidem (AMBIEN) 10 MG tablet Take 10 mg by mouth at bedtime. 12/10/18  Yes [provider]  anastrozole (ARIMIDEX) 1 MG tablet Take 1 tablet (1 mg total) by mouth daily. Patient not taking: Reported on 12/11/2018 06/07/12   Eston Esters, MD    Family History Family History  Problem Relation Age of Onset  . Heart disease Mother   . Diabetes Father   . Stroke Father   . Cancer Sister        lung  . Cancer Brother        liver  . Pancreatitis Brother     Social History Social History   Tobacco Use  . Smoking status: Never Smoker  . Smokeless tobacco: Never Used  Substance Use Topics  . Alcohol use: No  . Drug use: Not on file     Allergies   Codeine   Review of Systems Review of Systems  HENT: Positive for sore throat.   Respiratory: Positive for cough. Negative for shortness of breath.   Cardiovascular: Negative for chest pain.  Gastrointestinal: Negative for vomiting.  Musculoskeletal: Positive for gait problem.  Neurological: Positive for speech difficulty. Negative for headaches.  All other systems reviewed and are negative.    Physical  Exam Updated Vital Signs BP 133/71 (BP Location: Right Arm)   Pulse 74   Temp 98 F (36.7 C) (Oral)   Resp 13   SpO2 99%   Physical Exam Vitals signs and nursing note reviewed.  Constitutional:      Appearance: She is well-developed.  HENT:     Head: Normocephalic and atraumatic.     Right Ear: External ear normal.     Left Ear: External ear normal.     Nose: Nose normal.  Eyes:     General:        Right eye: No discharge.  Left eye: No discharge.     Extraocular Movements: Extraocular movements intact.     Pupils: Pupils are equal, round, and reactive to light.  Cardiovascular:     Rate and Rhythm: Normal rate and regular rhythm.     Heart sounds: Normal heart sounds.  Pulmonary:     Effort: Pulmonary effort is normal.     Breath sounds: Normal breath sounds.  Abdominal:     Palpations: Abdomen is soft.     Tenderness: There is no abdominal tenderness.  Skin:    General: Skin is warm and dry.  Neurological:     Mental Status: She is alert and oriented to person, place, and time.     Comments: CN 3-12 grossly intact. 5/5 strength in all 4 extremities. Grossly normal sensation. Normal finger to nose.   Psychiatric:        Mood and Affect: Mood is not anxious.        Speech: Speech is not slurred.      ED Treatments / Results  Labs (all labs ordered are listed, but only abnormal results are displayed) Labs Reviewed  APTT - Abnormal; Notable for the following components:      Result Value   aPTT 40 (*)    All other components within normal limits  URINALYSIS, ROUTINE W REFLEX MICROSCOPIC - Abnormal; Notable for the following components:   APPearance HAZY (*)    All other components within normal limits  COMPREHENSIVE METABOLIC PANEL - Abnormal; Notable for the following components:   Glucose, Bld 100 (*)    All other components within normal limits  BRAIN NATRIURETIC PEPTIDE - Abnormal; Notable for the following components:   B Natriuretic Peptide 216.6  (*)    All other components within normal limits  MRSA PCR SCREENING  PROTIME-INR  RAPID URINE DRUG SCREEN, HOSP PERFORMED  CBC WITH DIFFERENTIAL/PLATELET  TSH  VITAMIN B12  RPR  I-STAT TROPONIN, ED    EKG EKG Interpretation  Date/Time:  Tuesday December 11 2018 10:59:05 EST Ventricular Rate:  78 PR Interval:    QRS Duration: 120 QT Interval:  362 QTC Calculation: 413 R Axis:   -70 Text Interpretation:  Atrial fibrillation Left anterior fascicular block Probable anteroseptal infarct, old Baseline wander in lead(s) I II III aVR aVL aVF Confirmed by Sherwood Gambler [phone]) on 12/11/2018 11:33:30 AM   Radiology Dg Chest 2 View  Result Date: 12/11/2018 CLINICAL DATA:  Cough.  Stroke symptoms beginning today. EXAM: CHEST - 2 VIEW COMPARISON:  Two-view chest x-ray 12/25/2017 FINDINGS: The heart is mildly enlarged. Atherosclerotic changes are present at the aortic arch. There is no edema or effusion. No focal airspace disease is present. Pacing wires are stable. A remote T5 compression fracture is stable. Exaggerated thoracic kyphosis is again noted. IMPRESSION: 1. No acute cardiopulmonary disease. 2. Stable cardiomegaly without failure. 3. Changes of COPD. 4. Aortic atherosclerosis. Electronically Signed   By: San Morelle M.D.   On: 12/11/2018 11:52   Ct Head Wo Contrast  Result Date: 12/11/2018 CLINICAL DATA:  TIA. EXAM: CT HEAD WITHOUT CONTRAST TECHNIQUE: Contiguous axial images were obtained from the base of the skull through the vertex without intravenous contrast. COMPARISON:  12/25/2017 FINDINGS: Brain: Generalized atrophy. No evidence of acute infarction, mass lesion, hemorrhage, hydrocephalus or extra-axial collection. 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 Vascular: There is atherosclerotic calcification of the major vessels at the base of the brain. Skull: Negative Sinuses/Orbits: Clear/normal Other: None IMPRESSION: No acute finding by CT.  Old lacunar  infarction left caudate.  Electronically Signed   By: Nelson Chimes M.D.   On: 12/11/2018 11:42    Procedures Procedures (including critical care time)  Medications Ordered in ED Medications  metoprolol succinate (TOPROL-XL) 24 hr tablet 25 mg (has no administration in time range)  PARoxetine (PAXIL) tablet 20 mg (has no administration in time range)  zolpidem (AMBIEN) tablet 5 mg (has no administration in time range)  levothyroxine (SYNTHROID, LEVOTHROID) tablet 75 mcg (has no administration in time range)  pantoprazole (PROTONIX) EC tablet 40 mg (40 mg Oral Given 12/11/18 1619)  dabigatran (PRADAXA) capsule 75 mg (has no administration in time range)  gabapentin (NEURONTIN) tablet 300 mg (has no administration in time range)  calcium-vitamin D 500-200 MG-UNIT per tablet 1 tablet (has no administration in time range)  cholecalciferol (VITAMIN D3) tablet 1,000 Units (has no administration in time range)  potassium chloride (K-DUR) CR tablet 10 mEq (has no administration in time range)   stroke: mapping our early stages of recovery book (has no administration in time range)  acetaminophen (TYLENOL) tablet 650 mg (has no administration in time range)    Or  acetaminophen (TYLENOL) solution 650 mg (has no administration in time range)    Or  acetaminophen (TYLENOL) suppository 650 mg (has no administration in time range)  senna-docusate (Senokot-S) tablet 1 tablet (has no administration in time range)  atorvastatin (LIPITOR) tablet 80 mg (80 mg Oral Given 12/11/18 1619)     Initial Impression / Assessment and Plan / ED Course  I have reviewed the triage vital signs and the nursing notes.  Pertinent labs & imaging results that were available during my care of the patient were reviewed by me and considered in my medical decision making (see chart for details).     Patient has remained asymptomatic in the emergency department.  Initial work-up is unrevealing.  She is unable to get MRI due to her pacemaker.  Neurology  was consulted and the hospitalist will admit for TIA work-up.  Final Clinical Impressions(s) / ED Diagnoses   Final diagnoses:  TIA (transient ischemic attack)    ED Discharge Orders    None       Sherwood Gambler, MD 12/11/18 1622

## 2018-12-11 NOTE — Procedures (Signed)
[place] A. Merlene Laughter, MD     [email or web address]           HISTORY: This is a 85 year old female who presents with acute confusional state.  The studies been done to evaluate for nonconvulsive seizures.  MEDICATIONS: Scheduled Meds: .  stroke: mapping our early stages of recovery book   Does not apply Once  . atorvastatin  80 mg Oral q1800  . [START ON 12/12/2018] calcium-vitamin D  1 tablet Oral BH-q7a  . [START ON 12/12/2018] cholecalciferol  1,000 Units Oral BH-q7a  . dabigatran  75 mg Oral BID  . iopamidol      . [START ON 12/12/2018] levothyroxine  75 mcg Oral BH-q7a  . [START ON 12/12/2018] metoprolol succinate  25 mg Oral Daily  . pantoprazole  40 mg Oral Daily  . [START ON 12/12/2018] PARoxetine  20 mg Oral BH-q7a  . [START ON 12/12/2018] potassium chloride  10 mEq Oral BH-q7a  . zolpidem  5 mg Oral QHS   Continuous Infusions: PRN Meds:.acetaminophen **OR** acetaminophen (TYLENOL) oral liquid 160 mg/5 mL **OR** acetaminophen, gabapentin, senna-docusate  Prior to Admission medications   Medication Sig Start Date End Date Taking? Authorizing Provider  acetaminophen (TYLENOL) 500 MG tablet Take 1,000 mg by mouth daily as needed for mild pain.   Yes [provider]  Calcium Carb-Cholecalciferol (CALCIUM+D3) 600-800 MG-UNIT TABS Take 1 tablet by mouth every morning.   Yes [provider]  calcium carbonate (OS-CAL) 600 MG TABS Take 600 mg by mouth daily with breakfast.    Yes [provider]  cholecalciferol (VITAMIN D3) 25 MCG (1000 UT) tablet Take 1,000 Units by mouth every morning.    Yes [provider]  furosemide (LASIX) 40 MG tablet Take 40 mg by mouth daily as needed for fluid.  07/01/15  Yes [provider]  gabapentin (NEURONTIN) 600 MG tablet Take 0.5 tablets (300 mg total) by mouth 3 (three) times daily as needed. Patient taking differently: Take 300 mg by mouth 2 (two) times daily as needed (pain).  11/05/18   Yes Magnus Sinning, MD  ketoconazole (NIZORAL) 2 % cream Apply 1 application topically 2 (two) times daily as needed (for 2 weeks as needed for flares).   Yes [provider]  metoprolol succinate (TOPROL-XL) 25 MG 24 hr tablet Take 25 mg by mouth daily. One in am one in pm    Yes [provider]  miconazole (ZEASORB-AF) 2 % powder Apply 1 application topically as needed for itching (under the breasts).   Yes [provider]  Multiple Vitamins-Minerals (ABC PLUS PO) Take by mouth daily.     Yes [provider]  Omega-3 Fatty Acids (FISH OIL) 1200 MG CAPS Take 2,400 mg by mouth every morning.   Yes [provider]  pantoprazole (PROTONIX) 40 MG tablet Take 40 mg by mouth daily.  09/19/16  Yes [provider]  PARoxetine (PAXIL) 20 MG tablet Take 20 mg by mouth every morning.     Yes [provider]  potassium chloride (K-DUR) 10 MEQ tablet Take 10 mEq by mouth every morning.  09/12/16  Yes [provider]  PRADAXA 75 MG CAPS capsule Take 75 mg by mouth 2 (two) times daily.  07/21/16  Yes [provider]  SYNTHROID 75 MCG tablet Take 75 mcg by mouth every morning. 11/28/18  Yes [provider]  zolpidem (AMBIEN) 10 MG tablet Take 10 mg by mouth at bedtime. 12/10/18  Yes [provider]  anastrozole (ARIMIDEX) 1 MG tablet Take 1 tablet (1 mg total) by mouth daily. Patient not taking: Reported on 12/11/2018 06/07/12   Eston Esters, MD      ANALYSIS: A 16 channel recording using standard 10 20 measurements is conducted for 21 minutes.  There is a well-formed posterior dominant rhythm of 8.5 Hz which attenuates with eye opening.  There is beta activity observed in the frontal areas.  Awake and drowsy activities are observed.  Photic stimulation and hyperventilation are not conducted.  There is no focal or lateralized slowing.  There is no epileptiform activity is observed.   IMPRESSION: This is a normal  recording of the awake and drowsy states.      Gerald [place] A. Merlene Laughter, M.D.  Diplomate, Tax adviser of Psychiatry and Neurology ( Neurology).

## 2018-12-11 NOTE — ED Notes (Addendum)
Updated pt and family as to room pt will be admitted to.

## 2018-12-11 NOTE — ED Notes (Signed)
Attempted report to [facility]W RM 15 - Ready Bed Was told by "[name]" they were not able to take report at this time and to call back later.

## 2018-12-11 NOTE — H&P (Signed)
History and Physical    [name] YTK:[id number] DOB: 1925-11-16 DOA: 12/11/2018  PCP: Seward Carol, MD   Patient coming from: ALF  Chief Complaint: Slurred Speech and Balance issues with Staggering   HPI: [name] is a 85 y.o. female with medical history significant of redness, history of left breast cancer status post lumpectomy and no radiation but history of anastrozole, hypertension, hyperlipidemia, hypothyroidism, history of anxiety, history of pacemaker with defibrillator, history of pelvic fracture and other comorbidities who presents to the ED with strokelike symptoms that started this morning.  Patient states that this morning she woke up took a shower and went down for breakfast and got up to drinking half of her prune juice and half of her tea and then she states that people started "looking at her funny".  Patient did not know that she was slurring her speech and daughter states that she was slurring her speech on the phone.  Patient got up to go back to her room and started staggering and got to her room.  By the time EMS arrived her symptoms resolved.  Patient denied any chest pain, lightheadedness, nausea or vomiting.  States that her speech is back to normal and she just confuses why everyone was "looking at her".  Felt off balance and only other complaints that she has had a mild cough and mildly sore throat as she thinks that she is developing "cold."  ED Course: In the ED she had basic blood work done and a head CT was done.  Neurology was called to evaluate.  She also had a urinalysis and urine drug screen as well as a troponin and a BNP drawn.  Review of Systems: As per HPI otherwise 10 point review of systems negative.   Past Medical History:  Diagnosis Date  . Arthritis   . Breast cancer [facility]) 2011   Left Breast Cancer  . Cancer [facility]) 2011   Left Breast Cancer  . Hyperlipidemia   . Hypertension   . Pacemaker 2010   [place]  . Pelvic fracture ([place])  2010  . Thyroid disease     Past Surgical History:  Procedure Laterality Date  . ABDOMINAL HYSTERECTOMY  35 yrs ago  . BREAST LUMPECTOMY Left 05/23/10  . CARDIAC DEFIBRILLATOR PLACEMENT  2 years ago  . CHOLECYSTECTOMY  50 YEARS AGO  . ELBOW SURGERY  2006   SOCIAL HISTORY  reports that she has never smoked. She has never used smokeless tobacco. She reports that she does not drink alcohol. No history on file for drug.  Allergies  Allergen Reactions  . Codeine Nausea Only   Family History  Problem Relation Age of Onset  . Heart disease Mother   . Diabetes Father   . Stroke Father   . Cancer Sister        lung  . Cancer Brother        liver  . Pancreatitis Brother    Prior to Admission medications   Medication Sig Start Date End Date Taking? Authorizing Provider  anastrozole (ARIMIDEX) 1 MG tablet Take 1 tablet (1 mg total) by mouth daily. 06/07/12   Eston Esters, MD  aspirin 81 MG tablet Take 81 mg by mouth daily.      [provider]  calcium carbonate (OS-CAL) 600 MG TABS Take 600 mg by mouth 2 (two) times daily with a meal.      [provider]  Cholecalciferol (VITAMIN D) 400 UNIT/ML LIQD Take 400 Units  by mouth daily.      [provider]  furosemide (LASIX) 40 MG tablet Take 40 mg by mouth. 07/01/15   [provider]  gabapentin (NEURONTIN) 600 MG tablet Take 0.5 tablets (300 mg total) by mouth 3 (three) times daily as needed. 11/05/18   Magnus Sinning, MD  levothyroxine (SYNTHROID, LEVOTHROID) 88 MCG tablet Take 88 mcg by mouth daily.      [provider]  magnesium oxide (MAG-OX) 400 MG tablet Take 400 mg by mouth daily.      [provider]  metoprolol succinate (TOPROL-XL) 25 MG 24 hr tablet Take 25 mg by mouth daily. One in am one in pm     [provider]  Multiple Vitamins-Minerals (ABC PLUS PO) Take by mouth daily.      [provider]  pantoprazole (PROTONIX) 40 MG tablet  09/19/16   [provider]  PARoxetine (PAXIL) 20 MG tablet Take 20 mg by mouth every morning.      [provider]  potassium chloride (MICRO-K) 10 MEQ CR capsule  09/12/16   [provider]  PRADAXA 75 MG CAPS capsule  07/21/16   [provider]  vitamin C (ASCORBIC ACID) 500 MG tablet Take 500 mg by mouth daily.      [provider]  zolpidem (AMBIEN CR) 12.5 MG CR tablet Take 10 mg by mouth at bedtime as needed.      [provider]   Physical Exam: Vitals:   12/11/18 1130 12/11/18 1220 12/11/18 1300 12/11/18 1508  BP: (!) 141/66  (!) 120/94 133/71  Pulse: 75 73 76 74  Resp: 15 16 15 13   Temp:    98 F (36.7 C)  TempSrc:    Oral  SpO2: 96% 97% 98% 99%   Constitutional: WN/WD obese Caucasian female in NAD and appears calm and comfortable Eyes: Lids and conjunctivae normal, sclerae anicteric  ENMT: External Ears, Nose appear normal. Grossly normal hearing. Mucous membranes are moist.  Neck: Appears normal, supple, no cervical masses, normal ROM, no appreciable thyromegaly; no JVD Respiratory: Diminished to auscultation bilaterally, no wheezing, rales, rhonchi or crackles. Normal respiratory effort and patient is not tachypenic. No accessory muscle use.  Cardiovascular: Irregularly Irregular and paced, no murmurs / rubs / gallops. S1 and S2 auscultated. No extremity edema.  Abdomen: Soft, non-tender, Distended due to body habitus. No masses palpated. No appreciable hepatosplenomegaly. Bowel sounds positive x4.  GU: Deferred. Musculoskeletal: No clubbing / cyanosis of digits/nails. No joint deformity upper and lower extremities.  Skin: No rashes, lesions, ulcers on a limited skin evaluation. No induration; Warm and dry.  Neurologic: CN 2-12 grossly intact with no focal deficits noted. She is not slurring her speech now Romberg sign and cerebellar reflexes not assessed.  Psychiatric: Normal judgment and insight. Alert and oriented x 3. Normal mood and  appropriate affect.   Labs on Admission: I have personally reviewed following labs and imaging studies  CBC: Recent Labs  Lab 12/11/18 1254  WBC 9.6  NEUTROABS 6.3  HGB 13.7  HCT 41.7  MCV 94.3  PLT 287   Basic Metabolic Panel: Recent Labs  Lab 12/11/18 1254  NA 141  K 4.9  CL 109  CO2 24  GLUCOSE 100*  BUN 13  CREATININE 0.76  CALCIUM 9.4   GFR: CrCl cannot be calculated (Unknown ideal weight.). Liver Function Tests: Recent Labs  Lab 12/11/18 1254  AST 28  ALT 16  ALKPHOS 43  BILITOT 0.5  PROT 6.7  ALBUMIN 3.8   No results for input(s): LIPASE, AMYLASE in the last 168 hours. No results for input(s): AMMONIA in the last 168 hours. Coagulation Profile: Recent Labs  Lab 12/11/18 1254  INR 1.19   Cardiac Enzymes: No results for input(s): CKTOTAL, CKMB, CKMBINDEX, TROPONINI in the last 168 hours. BNP (last 3 results) No results for input(s): PROBNP in the last 8760 hours. HbA1C: No results for input(s): HGBA1C in the last 72 hours. CBG: No results for input(s): GLUCAP in the last 168 hours. Lipid Profile: No results for input(s): CHOL, HDL, LDLCALC, TRIG, CHOLHDL, LDLDIRECT in the last 72 hours. Thyroid Function Tests: No results for input(s): TSH, T4TOTAL, FREET4, T3FREE, THYROIDAB in the last 72 hours. Anemia Panel: No results for input(s): VITAMINB12, FOLATE, FERRITIN, TIBC, IRON, RETICCTPCT in the last 72 hours. Urine analysis:    Component Value Date/Time   COLORURINE YELLOW 12/11/2018 1212   APPEARANCEUR HAZY (A) 12/11/2018 1212   LABSPEC 1.009 12/11/2018 1212   PHURINE 7.0 12/11/2018 1212   GLUCOSEU NEGATIVE 12/11/2018 1212   HGBUR NEGATIVE 12/11/2018 1212   BILIRUBINUR NEGATIVE 12/11/2018 1212   KETONESUR NEGATIVE 12/11/2018 1212   PROTEINUR NEGATIVE 12/11/2018 1212   NITRITE NEGATIVE 12/11/2018 1212   LEUKOCYTESUR NEGATIVE 12/11/2018 1212   Sepsis Labs:  !!!!!!!!!!!!!!!!!!!!!!!!!!!!!!!!!!!!!!!!!!!! @LABRCNTIP (procalcitonin:4,lacticidven:4) )No results found for this or any previous visit (from the past 240 hour(s)).   Radiological Exams on Admission: Dg Chest 2 View  Result Date: 12/11/2018 CLINICAL DATA:  Cough.  Stroke symptoms beginning today. EXAM: CHEST - 2 VIEW COMPARISON:  Two-view chest x-ray 12/25/2017 FINDINGS: The heart is mildly enlarged. Atherosclerotic changes are present at the aortic arch. There is no edema or effusion. No focal airspace disease is present. Pacing wires are stable. A remote T5 compression fracture is stable. Exaggerated thoracic kyphosis is again noted. IMPRESSION: 1. No acute cardiopulmonary disease. 2. Stable cardiomegaly without failure. 3. Changes of COPD. 4. Aortic atherosclerosis. Electronically Signed   By: San Morelle M.D.   On: 12/11/2018 11:52   Ct Head Wo Contrast  Result Date: 12/11/2018 CLINICAL DATA:  TIA. EXAM: CT HEAD WITHOUT CONTRAST TECHNIQUE: Contiguous axial images were obtained from the base of the skull through the vertex without intravenous contrast. COMPARISON:  12/25/2017 FINDINGS: Brain: Generalized atrophy. No evidence of acute infarction, mass lesion, hemorrhage, hydrocephalus or extra-axial collection. 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 Vascular: There is atherosclerotic calcification of the major vessels at the base of the brain. Skull: Negative Sinuses/Orbits: Clear/normal Other: None IMPRESSION: No acute finding by CT.  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 Electronically Signed   By: Nelson Chimes M.D.   On: 12/11/2018 11:42   EKG: Independently reviewed. Has an AICD/Pacer and showed Atrial Fibrillation on my interpretation. No evidence of ST Elevation; QTc was 413  Assessment/Plan Active Problems:   Cancer of left breast T1b NX S/P lumpectomy 05/2010, Arimidex, no radiation   Generalized anxiety disorder   Chronic right-sided low back pain with right-sided sciatica    Stroke-like symptoms   Essential hypertension   Hyperlipidemia   Hypothyroidism   AICD (automatic cardioverter/defibrillator) present   GERD (gastroesophageal reflux disease)   Anxiety   Elevated brain natriuretic peptide (BNP) level   Hyperglycemia  Stroke-like Symptoms with Concern for TIA -Place in Obs Telemetry -Symptoms of slurred speech resolved; Have not ambulated the patient yet -Place on Neuro Floor and initiate TIA workup -Neurology consulted for further evaluation and recommendations and appreciate their input -Head CT showedNo acute finding by  CT.  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 -Unable to obtain MRI due to Pacer -Obtain CTA of the Head and Neck -Transthoracic ECHOCardiogram -Neurochecks per Protocol -Check FLP and A1c -PT/OT/SLP -Allow for Permissive HTN and C/w Telemetry -Per Neuro c/w Pradaxa and start High-intensity Statin with Atorvastatin 80 mg po qHS -Further Care per Neurology and they are checking TSH, RPR, B12 and also obtaining an EEG  Hx of Left Sided Breast Cancer -No longer taking Anastrozole -T1b NX and is s/p Lumpectomy in 2011 and had no Radiation   HLD -Check FLP in AM -Started High-Intensity Statin with Atorvastatin 80 mg po qHS  Hypothyroidism -Check TSH -C/w  Levothyroxine 75 mcg q each morning  GERD -C/w Pantoprazole  Hx of A Fib/Pacemaker/AICD -C/w Metoprolol Succinate 25 mg po Daily -C/w Telemetry   Anxiety -C/w Paroxetine 20 mg po q morning   Elevated BNP -No appreciable Volume Overload -Continue to Monitor  Hyperglycemia -Patient's Blood Sugar on CMP this AM was 100 -Check HbA1c  Obesity Estimated body mass index is 32.96 kg/m as calculated from the following:   Height as of 12/25/17: 5\' 4"  (1.626 m).   Weight as of 12/25/17: 87.1 kg.  -Will need updated Weight  -Weight Loss Counseling given   DVT prophylaxis: Anticoagulated with Dabigatran 75 mg po BID Code Status: DO NOT RESUSCITATE Family Communication:  Discussed with Daughters and Grand-daughters at bedside Disposition Plan: Pending PT/OT evaluation but anticipate D/C Back to [facility] called: Neurology  Admission status: Obs Telemetry   Severity of Illness: The appropriate patient status for this patient is OBSERVATION. Observation status is judged to be reasonable and necessary in order to provide the required intensity of service to ensure the patient's safety. The patient's presenting symptoms, physical exam findings, and initial radiographic and laboratory data in the context of their medical condition is felt to place them at decreased risk for further clinical deterioration. Furthermore, it is anticipated that the patient will be medically stable for discharge from the hospital within 2 midnights of admission. The following factors support the patient status of observation.   " The patient's presenting symptoms include Strokelike symptoms. " The physical exam findings include no appreciable focal deficits and no slurred speech. " The initial radiographic and laboratory data are re-assuring and showed hx of CVA  Kerney Elbe, D.O. [facility] PAGER is on [place]  If 7PM-7AM, please contact night-coverage [email or web address] Password [email or web address]  12/11/2018, 4:10 PM

## 2018-12-11 NOTE — Progress Notes (Signed)
EEG complete - results pending 

## 2018-12-11 NOTE — Progress Notes (Signed)
PT Cancellation Note  Patient Details Name: [name] MRN: [id number] DOB: November 26, 1925   Cancelled Treatment:    Reason Eval/Treat Not Completed: Patient at procedure or test/unavailable Pt currently at CT. Will follow up as schedule allows.   Leighton Ruff, PT, DPT  Acute Rehabilitation Services  Pager: [phone] Office: [phone]    Rudean Hitt 12/11/2018, 5:40 PM

## 2018-12-11 NOTE — ED Triage Notes (Signed)
Per Staff at [facility] pt LSN 0900 12/11/18, pt became disoriented, slurred speech, and balance issues.  Staff reports episode lasted 5-10 minutes.  Prior to EMS arrival, symptoms resolved.  Pt normally A/O, ambulatory with cane.  Pt denies any pain.  EMS also reports pt walked to stretcher.

## 2018-12-11 NOTE — ED Notes (Signed)
Culture sent in addition to UA 

## 2018-12-11 NOTE — Consult Note (Addendum)
Neurology Consultation  Reason for Consult: Generalized weakness and confusion  Referring Physician: Dr. Regenia Skeeter  CC: Generalized weakness and confusion  History is obtained from: Chart and patient along with family members  HPI: [name] is a 85 y.o. female with history of thyroid disease, pacemaker, hypertension, hyperlipidemia, cancer, arthritis, breast cancer and pelvic fracture who is on Pradaxa.  Patient currently lives in a skilled nursing facility.  Per daughters the reason that she is at the skilled nursing facility is due to the fact that they had noted that she was forgetting things, left the stove on, got lost during driving and they did not feel comfortable leaving her alone.  Daughters are fearful that she does have a neuro cognitive decline.  Unfortunately patient does have a pacemaker and is unable to have an MRI.  Patient was brought to the hospital secondary to confusion and generalized weakness.  According to the patient she woke up about 6:00.  At that time she felt fine.  She took a shower and then walked to get breakfast.  As she sat down she noted she looked around at the table and got very confused and could not recognize people.  She noted that the 2 women next to her were looking at her as if she was not acting correctly.  She stood up, and walked to her bedroom thinking she would feel better.  Due to the fact that she was swaying back and forth and appeared to be confused the secretary asked her how she was doing.  Patient said she was fine.  At the time she got to her room she laid down on the bed and felt slightly better.  When asked if she had any one-sided weakness or one-sided deficit she denied this and said it was a generalized weakness and a foggy sensation.  She does recall the majority of what happened.  She is feeling better at this point in time.    LKW: 7:45 AM on 12/11/2018 tpa given?: no, symptoms resolved Premorbid modified Rankin scale (mRS):  2 NIH stroke scale of 0  ROS: A 14 point ROS was performed and is negative except as noted in the HPI.  Past Medical History:  Diagnosis Date  . Arthritis   . Breast cancer [facility]) 2011   Left Breast Cancer  . Cancer [facility]) 2011   Left Breast Cancer  . Hyperlipidemia   . Hypertension   . Pacemaker 2010   [place]  . Pelvic fracture ([place]) 2010  . Thyroid disease     Family History  Problem Relation Age of Onset  . Heart disease Mother   . Diabetes Father   . Stroke Father   . Cancer Sister        lung  . Cancer Brother        liver  . Pancreatitis Brother    Social History:   reports that she has never smoked. She has never used smokeless tobacco. She reports that she does not drink alcohol. No history on file for drug.  Medications  Current Facility-Administered Medications:  .  lidocaine (PF) (XYLOCAINE) 1 % injection 0.3 mL, 0.3 mL, Other, Once, Magnus Sinning, MD  Current Outpatient Medications:  .  anastrozole (ARIMIDEX) 1 MG tablet, Take 1 tablet (1 mg total) by mouth daily., Disp: 30 tablet, Rfl: 0 .  aspirin 81 MG tablet, Take 81 mg by mouth daily.  , Disp: , Rfl:  .  calcium carbonate (OS-CAL) 600 MG TABS, Take  600 mg by mouth 2 (two) times daily with a meal.  , Disp: , Rfl:  .  Cholecalciferol (VITAMIN D) 400 UNIT/ML LIQD, Take 400 Units by mouth daily.  , Disp: , Rfl:  .  furosemide (LASIX) 40 MG tablet, Take 40 mg by mouth., Disp: , Rfl:  .  gabapentin (NEURONTIN) 600 MG tablet, Take 0.5 tablets (300 mg total) by mouth 3 (three) times daily as needed., Disp: 90 tablet, Rfl: 2 .  levothyroxine (SYNTHROID, LEVOTHROID) 88 MCG tablet, Take 88 mcg by mouth daily.  , Disp: , Rfl:  .  magnesium oxide (MAG-OX) 400 MG tablet, Take 400 mg by mouth daily.  , Disp: , Rfl:  .  metoprolol succinate (TOPROL-XL) 25 MG 24 hr tablet, Take 25 mg by mouth daily. One in am one in pm , Disp: , Rfl:  .  Multiple Vitamins-Minerals (ABC PLUS PO), Take by mouth daily.  , Disp: ,  Rfl:  .  pantoprazole (PROTONIX) 40 MG tablet, , Disp: , Rfl: 3 .  PARoxetine (PAXIL) 20 MG tablet, Take 20 mg by mouth every morning.  , Disp: , Rfl:  .  potassium chloride (MICRO-K) 10 MEQ CR capsule, , Disp: , Rfl:  .  PRADAXA 75 MG CAPS capsule, , Disp: , Rfl:  .  vitamin C (ASCORBIC ACID) 500 MG tablet, Take 500 mg by mouth daily.  , Disp: , Rfl:  .  zolpidem (AMBIEN CR) 12.5 MG CR tablet, Take 10 mg by mouth at bedtime as needed.  , Disp: , Rfl:    Exam: Current vital signs: BP (!) 120/94   Pulse 76   Temp 97.6 F (36.4 C) (Oral)   Resp 15   SpO2 98%  Vital signs in last 24 hours: Temp:  [97.6 F (36.4 C)] 97.6 F (36.4 C) (02/04 1050) Pulse Rate:  [69-77] 76 (02/04 1300) Resp:  [14-17] 15 (02/04 1300) BP: (120-169)/(66-102) 120/94 (02/04 1300) SpO2:  [96 %-100 %] 98 % (02/04 1300)  Physical Exam  Constitutional: Appears well-developed and well-nourished.  Psych: Affect appropriate to situation Eyes: No scleral injection HENT: No OP obstrucion Head: Normocephalic.  Cardiovascular: Normal rate and regular rhythm.  Respiratory: Effort normal, non-labored breathing GI: Soft.  No distension. There is no tenderness.  Skin: WDI  Neuro: Mental Status: Patient is awake, alert, oriented to person, place, month, year, and situation. Patient is able to give a clear and coherent history. No signs of aphasia or neglect Cranial Nerves: II: Visual Fields are full.  III,IV, VI: EOMI without ptosis or diploplia. Pupils are equal, round, and reactive to light.   V: Facial sensation is symmetric to temperature VII: Facial movement is symmetric.  VIII: hearing is intact to voice X: Palate elevates symmetrically XI: Shoulder shrug is symmetric. XII: tongue is midline; occasional tremor of tongue is present which family states is chronic  Motor: Tone is normal. Bulk is normal. 5/5 strength was present in all four extremities.  Postural tremor on her left hand and left neck but  has no increased rigidity or cogwheeling Sensory: Sensation is symmetric to light touch and temperature in the arms and legs. Deep Tendon Reflexes: 2+ and symmetric in the biceps; no knee jerks or ankle jerks Plantars: Toes are downgoing bilaterally.  Cerebellar: FNF without ataxia  Labs I have reviewed labs in epic and the results pertinent to this consultation are:   CBC    Component Value Date/Time   WBC 9.6 12/11/2018 1254   RBC 4.42  12/11/2018 1254   HGB 13.7 12/11/2018 1254   HGB 13.6 06/25/2012 1308   HCT 41.7 12/11/2018 1254   HCT 40.3 06/25/2012 1308   PLT 256 12/11/2018 1254   PLT 256 06/25/2012 1308   MCV 94.3 12/11/2018 1254   MCV 90.6 06/25/2012 1308   MCH 31.0 12/11/2018 1254   MCHC 32.9 12/11/2018 1254   RDW 13.3 12/11/2018 1254   RDW 13.8 06/25/2012 1308   LYMPHSABS 2.3 12/11/2018 1254   LYMPHSABS 2.0 06/25/2012 1308   MONOABS 0.7 12/11/2018 1254   MONOABS 0.6 06/25/2012 1308   EOSABS 0.2 12/11/2018 1254   EOSABS 0.2 06/25/2012 1308   BASOSABS 0.0 12/11/2018 1254   BASOSABS 0.1 06/25/2012 1308    CMP     Component Value Date/Time   NA 141 12/11/2018 1254   K 4.9 12/11/2018 1254   CL 109 12/11/2018 1254   CO2 24 12/11/2018 1254   GLUCOSE 100 (H) 12/11/2018 1254   BUN 13 12/11/2018 1254   CREATININE 0.76 12/11/2018 1254   CALCIUM 9.4 12/11/2018 1254   PROT 6.7 12/11/2018 1254   ALBUMIN 3.8 12/11/2018 1254   AST 28 12/11/2018 1254   ALT 16 12/11/2018 1254   ALKPHOS 43 12/11/2018 1254   BILITOT 0.5 12/11/2018 1254   GFRNONAA >60 12/11/2018 1254   GFRAA >60 12/11/2018 1254    Lipid Panel  No results found for: CHOL, TRIG, HDL, CHOLHDL, VLDL, LDLCALC, LDLDIRECT   Imaging I have reviewed the images obtained:  CT-scan of the brain- within normal limits  Etta Quill PA-C [facility] [phone] 12/11/2018, 2:28 PM     Assessment:  85 year old female with pacemaker who has atrial fibrillation and is on Pradaxa and ASA.   Patient had a transient spell of confusion and gait instability today.  At current time symptoms have resolved.   1. Differential includes general waxing and waning of neurocognitive decline and possible TIA. Significantly less likely but in the differential is possible unwitnessed or subclinical seizure with postictal state 2. Patient resides in skilled nursing facility secondary to neurocognitive decline but has not been diagnosed with dementia. Also on DDx for her presentation with transient confusion and gait instability is a cognitive fluctuation in the setting of possible Lewy body dementia - the family states that her cognition fluctuates from time to time with good days and bad days, a symptom commonly present in this subtype of dementia.   Recommend # CTA head and neck # Unable to perform MRI due to pacemaker # Transthoracic Echo, may need TEE if TTE is negative  # Continue Pradaxa and ASA  # Benefits of statin most likely outweighed by risks given advanced age # BP goal: permissive HTN up to 220/120 mmHg # HBAIC and Lipid profile # Telemetry monitoring # Frequent neuro checks # NPO until passes stroke swallow screen # EEG # Outpatient neurology follow up for neurocognitive testing and dementia evaluation # Please page stroke NP  Or  PA  Or MD from 8am -4 pm  as this patient from this time will be  followed by the stroke.   You can look them up on [email or web address]  Password [email or web address]   I have interviewed and examined the patient. I have formulated the assessment and plan.  Electronically signed: Dr. Kerney Elbe

## 2018-12-12 ENCOUNTER — Observation Stay (HOSPITAL_BASED_OUTPATIENT_CLINIC_OR_DEPARTMENT_OTHER): Payer: PPO

## 2018-12-12 DIAGNOSIS — Z95810 Presence of automatic (implantable) cardiac defibrillator: Secondary | ICD-10-CM | POA: Diagnosis not present

## 2018-12-12 DIAGNOSIS — I10 Essential (primary) hypertension: Secondary | ICD-10-CM | POA: Diagnosis not present

## 2018-12-12 DIAGNOSIS — I340 Nonrheumatic mitral (valve) insufficiency: Secondary | ICD-10-CM | POA: Diagnosis not present

## 2018-12-12 DIAGNOSIS — R2990 Unspecified symptoms and signs involving the nervous system: Secondary | ICD-10-CM | POA: Diagnosis not present

## 2018-12-12 DIAGNOSIS — E039 Hypothyroidism, unspecified: Secondary | ICD-10-CM | POA: Diagnosis not present

## 2018-12-12 DIAGNOSIS — E785 Hyperlipidemia, unspecified: Secondary | ICD-10-CM | POA: Diagnosis not present

## 2018-12-12 DIAGNOSIS — F411 Generalized anxiety disorder: Secondary | ICD-10-CM | POA: Diagnosis not present

## 2018-12-12 DIAGNOSIS — K219 Gastro-esophageal reflux disease without esophagitis: Secondary | ICD-10-CM | POA: Diagnosis not present

## 2018-12-12 LAB — CBC WITH DIFFERENTIAL/PLATELET
Abs Immature Granulocytes: 0.02 10*3/uL (ref 0.00–0.07)
Basophils Absolute: 0 10*3/uL (ref 0.0–0.1)
Basophils Relative: 0 %
Eosinophils Absolute: 0.2 10*3/uL (ref 0.0–0.5)
Eosinophils Relative: 2 %
HCT: 41.6 % (ref 36.0–46.0)
Hemoglobin: 13.5 g/dL (ref 12.0–15.0)
Immature Granulocytes: 0 %
LYMPHS PCT: 21 %
Lymphs Abs: 1.9 10*3/uL (ref 0.7–4.0)
MCH: 30.3 pg (ref 26.0–34.0)
MCHC: 32.5 g/dL (ref 30.0–36.0)
MCV: 93.5 fL (ref 80.0–100.0)
Monocytes Absolute: 0.7 10*3/uL (ref 0.1–1.0)
Monocytes Relative: 8 %
Neutro Abs: 6.2 10*3/uL (ref 1.7–7.7)
Neutrophils Relative %: 69 %
Platelets: 223 10*3/uL (ref 150–400)
RBC: 4.45 MIL/uL (ref 3.87–5.11)
RDW: 13.4 % (ref 11.5–15.5)
WBC: 9.1 10*3/uL (ref 4.0–10.5)
nRBC: 0 % (ref 0.0–0.2)

## 2018-12-12 LAB — COMPREHENSIVE METABOLIC PANEL
ALK PHOS: 39 U/L (ref 38–126)
ALT: 15 U/L (ref 0–44)
AST: 22 U/L (ref 15–41)
Albumin: 3.5 g/dL (ref 3.5–5.0)
Anion gap: 10 (ref 5–15)
BUN: 9 mg/dL (ref 8–23)
CO2: 25 mmol/L (ref 22–32)
Calcium: 9.2 mg/dL (ref 8.9–10.3)
Chloride: 108 mmol/L (ref 98–111)
Creatinine, Ser: 0.82 mg/dL (ref 0.44–1.00)
GFR calc Af Amer: >60 mL/min (ref >60)
GFR calc non Af Amer: >60 mL/min (ref >60)
Glucose, Bld: 117 mg/dL — ABNORMAL HIGH (ref 70–99)
Potassium: 3.9 mmol/L (ref 3.5–5.1)
SODIUM: 143 mmol/L (ref 135–145)
Total Bilirubin: 1.1 mg/dL (ref 0.3–1.2)
Total Protein: 6.5 g/dL (ref 6.5–8.1)

## 2018-12-12 LAB — LIPID PANEL
Cholesterol: 180 mg/dL (ref 0–200)
HDL: 49 mg/dL (ref >40)
LDL Cholesterol: 118 mg/dL — ABNORMAL HIGH (ref 0–99)
Total CHOL/HDL Ratio: 3.7 RATIO
Triglycerides: 67 mg/dL (ref <150)
VLDL: 13 mg/dL (ref 0–40)

## 2018-12-12 LAB — HEMOGLOBIN A1C
Hgb A1c MFr Bld: 6.1 % — ABNORMAL HIGH (ref 4.8–5.6)
Mean Plasma Glucose: 128.37 mg/dL

## 2018-12-12 LAB — MAGNESIUM: Magnesium: 2.1 mg/dL (ref 1.7–2.4)

## 2018-12-12 LAB — PHOSPHORUS: Phosphorus: 3.7 mg/dL (ref 2.5–4.6)

## 2018-12-12 LAB — ECHOCARDIOGRAM COMPLETE
Height: 64 in
Weight: 2934.76 oz

## 2018-12-12 LAB — RPR: RPR Ser Ql: NONREACTIVE

## 2018-12-12 MED ORDER — ATORVASTATIN CALCIUM 20 MG PO TABS: 20.0000 mg | ORAL_TABLET | Freq: Every day | ORAL | 0 refills | Status: DC

## 2018-12-12 MED ORDER — PHENOL 1.4 % MT LIQD
1.0000 | OROMUCOSAL | Status: DC | PRN
  Administered 2018-12-12: 1 via OROMUCOSAL
  Filled 2018-12-12: qty 177

## 2018-12-12 MED ORDER — ATORVASTATIN CALCIUM 10 MG PO TABS
20.0000 mg | ORAL_TABLET | Freq: Every day | ORAL | Status: DC
  Administered 2018-12-12: 20 mg via ORAL
  Filled 2018-12-12: qty 2

## 2018-12-12 MED FILL — ATORVASTATIN CALCIUM 20 MG: 20 | 30 days supply | Qty: 30 | Fill #0

## 2018-12-12 NOTE — Care Management Note (Signed)
Case Management Note  Patient Details  Name: [name] MRN: [id number] Date of Birth: 06/18/1926  Subjective/Objective:                    Action/Plan: Pt discharging back to [facility] today. Pt with orders for [facility] services. CSW spoke with [place] and they will arrange the [facility] services. Pts granddaughter to transport to facility.  Expected Discharge Date:  12/12/18               Expected Discharge Plan:  Assisted Living / Rest Home  In-House Referral:  Clinical Social Work  Discharge planning Services  CM Consult  Post Acute Care Choice:  Home Health([facility] to arrange) Choice offered to:     DME Arranged:    DME Agency:     HH Arranged:    [place] Agency:     Status of Service:  Completed, signed off  If discussed at [employer], dates discussed:    Additional Comments:  Pollie Friar, RN 12/12/2018, 4:53 PM

## 2018-12-12 NOTE — Progress Notes (Signed)
Discharge to: [facility] Anticipated discharge date: 12/12/18 Family notified: Yes, granddaughter at bedside Transportation by: Family car  Report #: [id number], ask for [place] signing off.  Laveda Abbe LCSW [phone]

## 2018-12-12 NOTE — Plan of Care (Signed)
Patient stable, discussed POC with patient and family, agreeable with plan for D/C to [facility], denies question/concerns at this time.

## 2018-12-12 NOTE — Progress Notes (Signed)
SLP Cancellation Note  Patient Details Name: BRINLYN CENA MRN: [id number] DOB: Dec 30, 1925   Cancelled treatment:        Bedside Swallow Evaluation not indicated as pt passed [place] and family doesn't report any s/s of oropharyngeal dysphagia or aspiration.    Tyrin Herbers 12/12/2018, 9:41 AM

## 2018-12-12 NOTE — Evaluation (Signed)
Occupational Therapy Evaluation Patient Details Name: [name] MRN: [id number] DOB: Jul 16, 1926 Today's Date: 12/12/2018    History of Present Illness [name] is a 85 y.o. female with medical history significant of redness, history of left breast cancer status post lumpectomy and no radiation but history of anastrozole, hypertension, hyperlipidemia, hypothyroidism, history of anxiety, history of pacemaker with defibrillator, history of pelvic fracture and other comorbidities who presents to the ED with strokelike symptoms   Clinical Impression   Pt admitted with above s/s and now presenting near baseline levels but with new deficits in dynamic balance and functional activity tolerance. Pt would benefit from skilled OT intervention to maximize return to PLOF and to decrease fall risk. Recommend brief therapy follow up at ALF upon d/c.     Follow Up Recommendations  Home health OT    Equipment Recommendations  None recommended by OT    Recommendations for Other Services PT consult;Speech consult     Precautions / Restrictions Precautions Precautions: Fall Restrictions Weight Bearing Restrictions: No      Mobility Bed Mobility Overal bed mobility: Needs Assistance Bed Mobility: Supine to Sit     Supine to sit: Supervision     General bed mobility comments: cueing for technique, no physical assist  Transfers Overall transfer level: Needs assistance Equipment used: Quad cane Transfers: Sit to/from [facility] Sit to Stand: Min guard Stand pivot transfers: Min guard       General transfer comment: min guard for balance deficits, very slight LOB that pt was able to correct    Balance Overall balance assessment: Needs assistance Sitting-balance support: Feet supported;No upper extremity supported Sitting balance-Leahy Scale: Good     Standing balance support: During functional activity Standing balance-Leahy Scale: Fair                              ADL either performed or assessed with clinical judgement   ADL Overall ADL's : Needs assistance/impaired Eating/Feeding: Modified independent   Grooming: Oral care;Sitting;Modified independent   Upper Body Bathing: Supervision/ safety;Sitting   Lower Body Bathing: Min guard;Sit to/from stand   Upper Body Dressing : Supervision/safety;Sitting   Lower Body Dressing: Min guard;Sit to/from stand;Cueing for safety   Toilet Transfer: Min guard;Ambulation;Cueing for safety(quad cane)   Toileting- Clothing Manipulation and Hygiene: Min guard;Sit to/from stand       Functional mobility during ADLs: Min guard;Cane General ADL Comments: close to baseline, balance deficits and possible need for different DME     Vision Baseline Vision/History: No visual deficits Patient Visual Report: No change from baseline Vision Assessment?: No apparent visual deficits     Perception Perception Perception Tested?: Yes Comments: WFL   Praxis Praxis Praxis tested?: Within functional limits    Pertinent Vitals/Pain Pain Assessment: No/denies pain     Hand Dominance Right   Extremity/Trunk Assessment Upper Extremity Assessment Upper Extremity Assessment: Overall WFL for tasks assessed(WFL for age)   Lower Extremity Assessment Lower Extremity Assessment: Defer to PT evaluation   Cervical / Trunk Assessment Cervical / Trunk Assessment: Kyphotic   Communication Communication Communication: No difficulties   Cognition Arousal/Alertness: Awake/alert Behavior During Therapy: WFL for tasks assessed/performed Overall Cognitive Status: History of cognitive impairments - at baseline                                 General Comments: Family reports  pt is at baseline cognitively    General Comments  Granddaghter present and very supportive. Pt very pleasant and sweet            Home Living Family/patient expects to be discharged to:: Assisted living                              Home Equipment: Cane - quad          Prior Functioning/Environment Level of Independence: Independent with assistive device(s)        Comments: Assistance provided occasionally with meals, but pt and granddaughter present report she was managing own medication. She moved to [employer] following a fall at granddaughters home.        OT Problem List: Decreased knowledge of use of DME or AE;Decreased safety awareness;Decreased coordination;Impaired balance (sitting and/or standing);Decreased activity tolerance      OT Treatment/Interventions: Self-care/ADL training;Therapeutic exercise;Energy conservation;DME and/or AE instruction;Manual therapy;Balance training;Patient/family education;Therapeutic activities;Cognitive remediation/compensation    OT Goals(Current goals can be found in the care plan section) Acute Rehab OT Goals Patient Stated Goal: "to get back to what I was doing" OT Goal Formulation: With patient/family Time For Goal Achievement: 12/22/18 Potential to Achieve Goals: Good  OT Frequency: Min 3X/week    AM-PAC OT "6 Clicks" Daily Activity     Outcome Measure Help from another person eating meals?: None Help from another person taking care of personal grooming?: None Help from another person toileting, which includes using toliet, bedpan, or urinal?: A Little Help from another person bathing (including washing, rinsing, drying)?: A Little Help from another person to put on and taking off regular upper body clothing?: A Little Help from another person to put on and taking off regular lower body clothing?: A Little 6 Click Score: 20   End of Session Nurse Communication: Mobility status  Activity Tolerance: Patient tolerated treatment well Patient left: in chair;with call bell/phone within reach;with family/visitor present  OT Visit Diagnosis: Unsteadiness on feet (R26.81);Muscle weakness (generalized) (M62.81);History of  falling (Z91.81)                Time: 0802-0821 OT Time Calculation (min): 19 min Charges:  OT General Charges $OT Visit: 1 Visit OT Evaluation $OT Eval Low Complexity: 1 Low  Curtis Sites OTR/L  12/12/2018, 9:06 AM

## 2018-12-12 NOTE — Discharge Summary (Signed)
Physician Discharge Summary  [name] WVP:[id number] DOB: 1926/09/14 DOA: 12/11/2018  PCP: Seward Carol, MD  Admit date: 12/11/2018 Discharge date: 12/12/2018  Admitted From: ALF Disposition:  ALF       Discharge Condition:  stable   CODE STATUS:  DNR   Diet recommendation:  Heart healthy Consultations:  Neuro/stroke    Discharge Diagnoses:  Principal Problem:   Stroke-like symptoms Active Problems:   Cancer of left breast T1b NX S/P lumpectomy 05/2010, Arimidex, no radiation   Generalized anxiety disorder   Chronic right-sided low back pain with right-sided sciatica   Essential hypertension   Hyperlipidemia   Hypothyroidism   AICD (automatic cardioverter/defibrillator) present   GERD (gastroesophageal reflux disease)   Anxiety   Elevated brain natriuretic peptide (BNP) level   Hyperglycemia       HPI: [name] is a 85 y.o. female with medical history significant of redness, history of left breast cancer status post lumpectomy and no radiation but history of anastrozole, hypertension, hyperlipidemia, hypothyroidism, history of anxiety, history of pacemaker with defibrillator, history of pelvic fracture and other comorbidities who presents to the ED with stroke-like symptoms that started this morning.  Patient states that this morning she woke up took a shower and went down for breakfast and got up to drinking half of her prune juice and half of her tea and then she states that people started "looking at her funny".  Patient did not know that she was slurring her speech and daughter states that she was slurring her speech on the phone.  Patient got up to go back to her room and started staggering and got to her room.  By the time EMS arrived her symptoms resolved.  Patient denied any chest pain, lightheadedness, nausea or vomiting.  States that her speech is back to normal and she just confuses why everyone was "looking at her".  Felt off balance and only other  complaints that she has had a mild cough and mildly sore throat as she thinks that she is developing "cold."  Hospital Course:  Stroke like symptoms- possibleTIA - presented with transitional confusion and loss of balance- differential> TIA vs seizure - - EEG normal - head CT- no new infarct- prior infarct noted -CTA head and neck> Left subclavian artery 60% stenosis with fibrofatty plaque.  Right ICA 60% stenosis with calcified plaque. - cannot perform MRI due to pacemaker - LDL 118- Lipitor started - A1c 6.1 - ECHO > no thrombus noted- see report below - she has been on Pradaxa for A-fib which can be continued per neurology-  - no PT needed  A-fib- pacer AICD - cont Pradaxa and Toprol  Hypothyroid - Synthroid  H/o left breast CA - cont Anastrozole  Obesity Body mass index is 31.48 kg/m.   Discharge Exam: Vitals:   12/12/18 1200 12/12/18 1415  BP: (!) 144/77 112/79  Pulse: 82 73  Resp: 17 (!) 22  Temp: 98.6 F (37 C)   SpO2: 97% 99%   Vitals:   12/12/18 0426 12/12/18 0735 12/12/18 1200 12/12/18 1415  BP: (!) 150/73 (!) 146/78 (!) 144/77 112/79  Pulse: 66 70 82 73  Resp: (!) 23 17 17  (!) 22  Temp:  98.5 F (36.9 C) 98.6 F (37 C)   TempSrc:  Oral Oral   SpO2: (!) 70% 99% 97% 99%  Weight: 83.2 kg     Height:        General: Pt is alert, awake, not in acute distress  Cardiovascular: RRR, S1/S2 +, no rubs, no gallops Respiratory: CTA bilaterally, no wheezing, no rhonchi Abdominal: Soft, NT, ND, bowel sounds + Extremities: no edema, no cyanosis   Discharge Instructions  Discharge Instructions    Diet - low sodium heart healthy   Complete by:  As directed    Increase activity slowly   Complete by:  As directed      Allergies as of 12/12/2018      Reactions   Codeine Nausea Only      Medication List    TAKE these medications   ABC PLUS PO Take by mouth daily.   acetaminophen 500 MG tablet Commonly known as:  TYLENOL Take 1,000 mg by mouth daily  as needed for mild pain.   anastrozole 1 MG tablet Commonly known as:  ARIMIDEX Take 1 tablet (1 mg total) by mouth daily.   atorvastatin 20 MG tablet Commonly known as:  LIPITOR Take 1 tablet (20 mg total) by mouth daily at 6 PM.   calcium carbonate 600 MG Tabs tablet Commonly known as:  OS-CAL Take 600 mg by mouth daily with breakfast.   CALCIUM+D3 600-800 MG-UNIT Tabs Generic drug:  Calcium Carb-Cholecalciferol Take 1 tablet by mouth every morning.   cholecalciferol 25 MCG (1000 UT) tablet Commonly known as:  VITAMIN D3 Take 1,000 Units by mouth every morning.   Fish Oil 1200 MG Caps Take 2,400 mg by mouth every morning.   furosemide 40 MG tablet Commonly known as:  LASIX Take 40 mg by mouth daily as needed for fluid.   gabapentin 600 MG tablet Commonly known as:  NEURONTIN Take 0.5 tablets (300 mg total) by mouth 3 (three) times daily as needed. What changed:    when to take this  reasons to take this   ketoconazole 2 % cream Commonly known as:  NIZORAL Apply 1 application topically 2 (two) times daily as needed (for 2 weeks as needed for flares).   metoprolol succinate 25 MG 24 hr tablet Commonly known as:  TOPROL-XL Take 25 mg by mouth daily. One in am one in pm   pantoprazole 40 MG tablet Commonly known as:  PROTONIX Take 40 mg by mouth daily.   PARoxetine 20 MG tablet Commonly known as:  PAXIL Take 20 mg by mouth every morning.   potassium chloride 10 MEQ tablet Commonly known as:  K-DUR Take 10 mEq by mouth every morning.   PRADAXA 75 MG Caps capsule Generic drug:  dabigatran Take 75 mg by mouth 2 (two) times daily.   SYNTHROID 75 MCG tablet Generic drug:  levothyroxine Take 75 mcg by mouth every morning.   ZEASORB-AF 2 % powder Generic drug:  miconazole Apply 1 application topically as needed for itching (under the breasts).   zolpidem 10 MG tablet Commonly known as:  AMBIEN Take 10 mg by mouth at bedtime.       Allergies   Allergen Reactions  . Codeine Nausea Only     Procedures/Studies: EEG>This is a normal recording of the awake and drowsy states.  2 D ECHO 1. The left ventricle has normal systolic function of 34-19%. The cavity size is normal. There is mildly increased left ventricular wall thickness. Left ventricular diastology could not be evaluated secondary to atrial fibrillation.  2. The right ventricle has normal systolic function. The cavity in normal in size. There is no increase in right ventricular wall thickness. Right ventricular systolic pressure is mildly elevated with an estimated pressure of 35.0 mmHg.  3. Severely  dilated left atrial size.  4. Mildly dilated right atrial size.  5. The aortic valve is tricuspid. There is mild thickening of the aortic valve.  6. The inferior vena cava was dilated in size with >50% respiratory variability.  7. Normal LV systolic function; mild LVH; biatrial enlargement; trace AI; mild MR and TR; mild pulmonary hypertension.  Ct Angio Head W Or Wo Contrast  Result Date: 12/11/2018 CLINICAL DATA:  85 y/o F; episode of slurred speech. Evaluation of stroke. EXAM: CT ANGIOGRAPHY HEAD AND NECK TECHNIQUE: Multidetector CT imaging of the head and neck was performed using the standard protocol during bolus administration of intravenous contrast. Multiplanar CT image reconstructions and MIPs were obtained to evaluate the vascular anatomy. Carotid stenosis measurements (when applicable) are obtained utilizing NASCET criteria, using the distal internal carotid diameter as the denominator. CONTRAST:  170mL ISOVUE-370 IOPAMIDOL (ISOVUE-370) INJECTION 76% COMPARISON:  12/11/2018 CT head. FINDINGS: CTA NECK FINDINGS Aortic arch: Aberrant right subclavian artery period. Left common carotid artery arises from the right brachiocephalic artery. Shaggy fibrofatty plaque of the aortic arch. Predominant fibrofatty plaque of left subclavian artery origin with moderate 60% stenosis. Right  carotid system: Calcified plaque of the carotid bifurcation with moderate 60% proximal ICA stenosis. No dissection or aneurysm. Left carotid system: No evidence of dissection, stenosis (50% or greater) or occlusion. Calcified plaque of the carotid bifurcation with mild less than 50% proximal ICA stenosis. Vertebral arteries: Codominant. No evidence of dissection, stenosis (50% or greater) or occlusion. Skeleton: Negative. Other neck: Negative. Upper chest: Mild air trapping in the lung apices. Peribronchial thickening. Review of the MIP images confirms the above findings CTA HEAD FINDINGS Anterior circulation: No significant stenosis, proximal occlusion, aneurysm, or vascular malformation. Non stenotic calcific atherosclerosis of carotid siphons. Posterior circulation: No significant stenosis, proximal occlusion, aneurysm, or vascular malformation. Venous sinuses: As permitted by contrast timing, patent. Anatomic variants: Complete circle-of-Willis. Left fetal PCA. Delayed phase: No abnormal intracranial enhancement. Review of the MIP images confirms the above findings IMPRESSION: CTA neck: 1. Extensive shaggy fibrofatty plaque of aortic arch. 2. Left subclavian artery origin moderate 60% stenosis with fibrofatty plaque. 3. Right proximal ICA moderate 60% stenosis with calcified plaque. 4. Patent left carotid and vertebral arteries. No hemodynamically significant stenosis by NASCET criteria. CTA head: 1. Patent anterior and posterior intracranial circulation. No large vessel occlusion, aneurysm, or significant stenosis is identified. Electronically Signed   By: Kristine Garbe M.D.   On: 12/11/2018 19:05   Dg Chest 2 View  Result Date: 12/11/2018 CLINICAL DATA:  Cough.  Stroke symptoms beginning today. EXAM: CHEST - 2 VIEW COMPARISON:  Two-view chest x-ray 12/25/2017 FINDINGS: The heart is mildly enlarged. Atherosclerotic changes are present at the aortic arch. There is no edema or effusion. No focal  airspace disease is present. Pacing wires are stable. A remote T5 compression fracture is stable. Exaggerated thoracic kyphosis is again noted. IMPRESSION: 1. No acute cardiopulmonary disease. 2. Stable cardiomegaly without failure. 3. Changes of COPD. 4. Aortic atherosclerosis. Electronically Signed   By: San Morelle M.D.   On: 12/11/2018 11:52   Ct Head Wo Contrast  Result Date: 12/11/2018 CLINICAL DATA:  TIA. EXAM: CT HEAD WITHOUT CONTRAST TECHNIQUE: Contiguous axial images were obtained from the base of the skull through the vertex without intravenous contrast. COMPARISON:  12/25/2017 FINDINGS: Brain: Generalized atrophy. No evidence of acute infarction, mass lesion, hemorrhage, hydrocephalus or extra-axial collection. 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 Vascular: There is atherosclerotic calcification of the major vessels at the  base of the brain. Skull: Negative Sinuses/Orbits: Clear/normal Other: None IMPRESSION: No acute finding by CT.  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 Electronically Signed   By: Nelson Chimes M.D.   On: 12/11/2018 11:42   Ct Angio Neck W Or Wo Contrast  Result Date: 12/11/2018 CLINICAL DATA:  85 y/o F; episode of slurred speech. Evaluation of stroke. EXAM: CT ANGIOGRAPHY HEAD AND NECK TECHNIQUE: Multidetector CT imaging of the head and neck was performed using the standard protocol during bolus administration of intravenous contrast. Multiplanar CT image reconstructions and MIPs were obtained to evaluate the vascular anatomy. Carotid stenosis measurements (when applicable) are obtained utilizing NASCET criteria, using the distal internal carotid diameter as the denominator. CONTRAST:  152mL ISOVUE-370 IOPAMIDOL (ISOVUE-370) INJECTION 76% COMPARISON:  12/11/2018 CT head. FINDINGS: CTA NECK FINDINGS Aortic arch: Aberrant right subclavian artery period. Left common carotid artery arises from the right brachiocephalic artery. Shaggy fibrofatty plaque of the aortic arch.  Predominant fibrofatty plaque of left subclavian artery origin with moderate 60% stenosis. Right carotid system: Calcified plaque of the carotid bifurcation with moderate 60% proximal ICA stenosis. No dissection or aneurysm. Left carotid system: No evidence of dissection, stenosis (50% or greater) or occlusion. Calcified plaque of the carotid bifurcation with mild less than 50% proximal ICA stenosis. Vertebral arteries: Codominant. No evidence of dissection, stenosis (50% or greater) or occlusion. Skeleton: Negative. Other neck: Negative. Upper chest: Mild air trapping in the lung apices. Peribronchial thickening. Review of the MIP images confirms the above findings CTA HEAD FINDINGS Anterior circulation: No significant stenosis, proximal occlusion, aneurysm, or vascular malformation. Non stenotic calcific atherosclerosis of carotid siphons. Posterior circulation: No significant stenosis, proximal occlusion, aneurysm, or vascular malformation. Venous sinuses: As permitted by contrast timing, patent. Anatomic variants: Complete circle-of-Willis. Left fetal PCA. Delayed phase: No abnormal intracranial enhancement. Review of the MIP images confirms the above findings IMPRESSION: CTA neck: 1. Extensive shaggy fibrofatty plaque of aortic arch. 2. Left subclavian artery origin moderate 60% stenosis with fibrofatty plaque. 3. Right proximal ICA moderate 60% stenosis with calcified plaque. 4. Patent left carotid and vertebral arteries. No hemodynamically significant stenosis by NASCET criteria. CTA head: 1. Patent anterior and posterior intracranial circulation. No large vessel occlusion, aneurysm, or significant stenosis is identified. Electronically Signed   By: Kristine Garbe M.D.   On: 12/11/2018 19:05     The results of significant diagnostics from this hospitalization (including imaging, microbiology, ancillary and laboratory) are listed below for reference.     Microbiology: Recent Results (from the  past 240 hour(s))  MRSA PCR Screening     Status: None   Collection Time: 12/11/18  4:49 PM  Result Value Ref Range Status   MRSA by PCR NEGATIVE NEGATIVE Final    Comment:        The GeneXpert MRSA Assay (FDA approved for NASAL specimens only), is one component of a comprehensive MRSA colonization surveillance program. It is not intended to diagnose MRSA infection nor to guide or monitor treatment for MRSA infections. Performed at [facility], [place] [address], [place], [place] [address]      Labs: BNP (last 3 results) Recent Labs    12/11/18 1254  BNP 034.7*   Basic Metabolic Panel: Recent Labs  Lab 12/11/18 1254 12/12/18 0518  NA 141 143  K 4.9 3.9  CL 109 108  CO2 24 25  GLUCOSE 100* 117*  BUN 13 9  CREATININE 0.76 0.82  CALCIUM 9.4 9.2  MG  --  2.1  PHOS  --  3.7   Liver Function Tests: Recent Labs  Lab 12/11/18 1254 12/12/18 0518  AST 28 22  ALT 16 15  ALKPHOS 43 39  BILITOT 0.5 1.1  PROT 6.7 6.5  ALBUMIN 3.8 3.5   No results for input(s): LIPASE, AMYLASE in the last 168 hours. No results for input(s): AMMONIA in the last 168 hours. CBC: Recent Labs  Lab 12/11/18 1254 12/12/18 0518  WBC 9.6 9.1  NEUTROABS 6.3 6.2  HGB 13.7 13.5  HCT 41.7 41.6  MCV 94.3 93.5  PLT 256 223   Cardiac Enzymes: No results for input(s): CKTOTAL, CKMB, CKMBINDEX, TROPONINI in the last 168 hours. BNP: Invalid input(s): POCBNP CBG: No results for input(s): GLUCAP in the last 168 hours. D-Dimer No results for input(s): DDIMER in the last 72 hours. Hgb A1c Recent Labs    12/12/18 0518  HGBA1C 6.1*   Lipid Profile Recent Labs    12/12/18 0518  CHOL 180  HDL 49  LDLCALC 118*  TRIG 67  CHOLHDL 3.7   Thyroid function studies Recent Labs    12/11/18 1530  TSH 3.970   Anemia work up Recent Labs    12/11/18 1530  VITAMINB12 696   Urinalysis    Component Value Date/Time   COLORURINE YELLOW 12/11/2018 1212   APPEARANCEUR HAZY (A)  12/11/2018 1212   LABSPEC 1.009 12/11/2018 1212   PHURINE 7.0 12/11/2018 1212   GLUCOSEU NEGATIVE 12/11/2018 1212   HGBUR NEGATIVE 12/11/2018 1212   BILIRUBINUR NEGATIVE 12/11/2018 1212   KETONESUR NEGATIVE 12/11/2018 1212   PROTEINUR NEGATIVE 12/11/2018 1212   NITRITE NEGATIVE 12/11/2018 1212   LEUKOCYTESUR NEGATIVE 12/11/2018 1212   Sepsis Labs Invalid input(s): PROCALCITONIN,  WBC,  LACTICIDVEN Microbiology Recent Results (from the past 240 hour(s))  MRSA PCR Screening     Status: None   Collection Time: 12/11/18  4:49 PM  Result Value Ref Range Status   MRSA by PCR NEGATIVE NEGATIVE Final    Comment:        The GeneXpert MRSA Assay (FDA approved for NASAL specimens only), is one component of a comprehensive MRSA colonization surveillance program. It is not intended to diagnose MRSA infection nor to guide or monitor treatment for MRSA infections. Performed at [facility], [place] [address], [place], [place] [address]      Time coordinating discharge in minutes: 65  SIGNED:   Debbe Odea, MD  [facility] 12/12/2018, 4:30 PM Pager   If 7PM-7AM, please contact night-coverage [email or web address] Password [email or web address]

## 2018-12-12 NOTE — NC FL2 (Signed)
[place] LEVEL OF CARE SCREENING TOOL     IDENTIFICATION  Patient Name: [name] Birthdate: 12-May-1926 Sex: female Admission Date (Current Location): 12/11/2018  [facility] and [place] Number:  Herbalist and Address:  [facility] [place], [place] [address], [place], [place] [address]      Provider Number: [id number]  Attending Physician Name and Address:  Debbe Odea, MD  Relative Name and Phone Number:       Current Level of Care: Hospital Recommended Level of Care: [place] Prior Approval Number:    Date Approved/Denied:   PASRR Number:    Discharge Plan: Other (Comment)(ALF)    Current Diagnoses: Patient Active Problem List   Diagnosis Date Noted  . Stroke-like symptoms 12/11/2018  . Essential hypertension 12/11/2018  . Hyperlipidemia 12/11/2018  . Hypothyroidism 12/11/2018  . AICD (automatic cardioverter/defibrillator) present 12/11/2018  . GERD (gastroesophageal reflux disease) 12/11/2018  . Anxiety 12/11/2018  . Elevated brain natriuretic peptide (BNP) level 12/11/2018  . Hyperglycemia 12/11/2018  . Generalized anxiety disorder 01/02/2017  . Chronic right-sided low back pain with right-sided sciatica 01/02/2017  . Spondylosis without myelopathy or radiculopathy, lumbar region 01/02/2017  . Lumbar radiculopathy 01/02/2017  . Cancer of left breast T1b NX S/P lumpectomy 05/2010, Arimidex, no radiation 06/03/2011    Orientation RESPIRATION BLADDER Height & Weight     Self, Time, Situation, Place  Normal Continent Weight: 183 lb 6.8 oz (83.2 kg) Height:  5\' 4"  (162.6 cm)  BEHAVIORAL SYMPTOMS/MOOD NEUROLOGICAL BOWEL NUTRITION STATUS      Continent Diet(see DC summary)  AMBULATORY STATUS COMMUNICATION OF NEEDS Skin   Limited Assist Verbally Normal                       Personal Care Assistance Level of Assistance  Bathing, Feeding, Dressing Bathing Assistance: Limited assistance Feeding  assistance: Independent Dressing Assistance: Limited assistance     Functional Limitations Info  Sight, Hearing, Speech Sight Info: Adequate Hearing Info: Impaired(hard of hearing) Speech Info: Adequate    SPECIAL CARE FACTORS FREQUENCY  PT (By licensed PT), OT (By licensed OT)     PT Frequency: 3x/wk with home health OT Frequency: 3x/wk with home health            Contractures Contractures Info: Not present    Additional Factors Info  Code Status, Allergies Code Status Info: DNR Allergies Info: Codeine           Current Medications (12/12/2018):  This is the current hospital active medication list Current Facility-Administered Medications  Medication Dose Route Frequency Provider Last Rate Last Dose  .  stroke: mapping our early stages of recovery book   Does not apply Once [facility], [place] Latif, DO      . acetaminophen (TYLENOL) tablet 650 mg  650 mg Oral Q4H PRN Raiford Noble Latif, DO       Or  . acetaminophen (TYLENOL) solution 650 mg  650 mg Per Tube Q4H PRN Raiford Noble Latif, DO       Or  . acetaminophen (TYLENOL) suppository 650 mg  650 mg Rectal Q4H PRN Raiford Noble Latif, DO      . atorvastatin (LIPITOR) tablet 20 mg  20 mg Oral q1800 Donzetta Starch, NP      . calcium-vitamin D 500-200 MG-UNIT per tablet 1 tablet  1 tablet Oral BH-q7a Sheikh, [place] Latif, DO      . cholecalciferol (VITAMIN D3) tablet 1,000 Units  1,000 Units Oral [address], [place], [place]   1,000 Units at 12/12/18 1856  . dabigatran (PRADAXA) capsule 75 mg  75 mg Oral BID Raiford Noble [place], DO   75 mg at 12/12/18 1005  . gabapentin (NEURONTIN) tablet 300 mg  300 mg Oral BID PRN Raiford Noble Latif, DO      . levothyroxine (SYNTHROID, LEVOTHROID) tablet 75 mcg  75 mcg Oral [address], Georgina Quint [place], [place]   75 mcg at 12/12/18 3149  . metoprolol succinate (TOPROL-XL) 24 hr tablet 25 mg  25 mg Oral Daily Raiford Noble [place], DO   25 mg at 12/12/18 1005  . pantoprazole (PROTONIX) EC tablet 40  mg  40 mg Oral Daily Raiford Noble [place], DO   40 mg at 12/12/18 1005  . PARoxetine (PAXIL) tablet 20 mg  20 mg Oral [address] Oma[place], [place]   20 mg at 12/12/18 7026  . phenol (CHLORASEPTIC) mouth spray 1 spray  1 spray Mouth/Throat PRN Debbe Odea, MD   1 spray at 12/12/18 1007  . potassium chloride (K-DUR) CR tablet 10 mEq  10 mEq Oral [address], [place], [place]   10 mEq at 12/12/18 3785  . senna-docusate (Senokot-S) tablet 1 tablet  1 tablet Oral QHS PRN Raiford Noble Latif, DO      . zolpidem [facility]) tablet 5 mg  5 mg Oral QHS Sheikh, [place], DO   5 mg at 12/11/18 2209     Discharge Medications: Please see discharge summary for a list of discharge medications.  Relevant Imaging Results:  Relevant Lab Results:   Additional Information SS#: [id number]  Geralynn Ochs, LCSW

## 2018-12-12 NOTE — Care Management Obs Status (Signed)
[place] NOTIFICATION   Patient Details  Name: [name] MRN: [id number] Date of Birth: August 25, 1926   Medicare Observation Status Notification Given:  Yes    Pollie Friar, RN 12/12/2018, 4:51 PM

## 2018-12-12 NOTE — Evaluation (Signed)
Speech Language Pathology Evaluation Patient Details Name: [name] MRN: [id number] DOB: Nov 21, 1925 Today's Date: 12/12/2018 Time: 4034-7425 SLP Time Calculation (min) (ACUTE ONLY): 10 min  Problem List:  Patient Active Problem List   Diagnosis Date Noted  . Stroke-like symptoms 12/11/2018  . Essential hypertension 12/11/2018  . Hyperlipidemia 12/11/2018  . Hypothyroidism 12/11/2018  . AICD (automatic cardioverter/defibrillator) present 12/11/2018  . GERD (gastroesophageal reflux disease) 12/11/2018  . Anxiety 12/11/2018  . Elevated brain natriuretic peptide (BNP) level 12/11/2018  . Hyperglycemia 12/11/2018  . Generalized anxiety disorder 01/02/2017  . Chronic right-sided low back pain with right-sided sciatica 01/02/2017  . Spondylosis without myelopathy or radiculopathy, lumbar region 01/02/2017  . Lumbar radiculopathy 01/02/2017  . Cancer of left breast T1b NX S/P lumpectomy 05/2010, Arimidex, no radiation 06/03/2011   Past Medical History:  Past Medical History:  Diagnosis Date  . Arthritis   . Atrial fibrillation ([place])   . Breast cancer [facility]) 2011   Left Breast Cancer  . Cancer [facility]) 2011   Left Breast Cancer  . Hyperlipidemia   . Hypertension   . Pacemaker 2010   [place]  . Pelvic fracture ([place]) 2010  . Thyroid disease    Past Surgical History:  Past Surgical History:  Procedure Laterality Date  . ABDOMINAL HYSTERECTOMY  35 yrs ago  . BREAST LUMPECTOMY Left 05/23/10  . CARDIAC DEFIBRILLATOR PLACEMENT  2 years ago  . CHOLECYSTECTOMY  45 YEARS AGO  . ELBOW SURGERY  2006   HPI:  [name] is a 85 y.o. female with medical history significant of redness, history of left breast cancer status post lumpectomy and no radiation but history of anastrozole, hypertension, hyperlipidemia, hypothyroidism, history of anxiety, history of pacemaker with defibrillator, history of pelvic fracture and other comorbidities who presents to the ED with stroke like  symptoms. CT head negative.    Assessment / Plan / Recommendation Clinical Impression  Pt presents with baseline cognitive linguistic abilities that are functional fo her previous ability and should not prevent her from returning to ALF. Pt is oriented x 4 demonstrates appropriate selective attention and problem solving. Her memory is functional and is confirms facts with family who are present. Family also state that pt is back at baseline. No further ST services are indicated at this time. Education provided to pt's family and all questions answered to their satisfaction.     SLP Assessment  SLP Recommendation/Assessment: Patient does not need any further Speech Lanaguage Pathology Services SLP Visit Diagnosis: Cognitive communication deficit (R41.841)    Follow Up Recommendations  None    Frequency and Duration           SLP Evaluation Cognition  Overall Cognitive Status: History of cognitive impairments - at baseline Arousal/Alertness: Awake/alert Orientation Level: Oriented X4 Attention: Selective Selective Attention: Appears intact Memory: (at baseline - functional for age) Awareness: Appears intact Problem Solving: Appears intact       Comprehension  Auditory Comprehension Overall Auditory Comprehension: Appears within functional limits for tasks assessed Visual Recognition/Discrimination Discrimination: Not tested Reading Comprehension Reading Status: Not tested    Expression Expression Primary Mode of Expression: Verbal Verbal Expression Overall Verbal Expression: Appears within functional limits for tasks assessed Initiation: No impairment Level of Generative/Spontaneous Verbalization: Conversation Pragmatics: No impairment Non-Verbal Means of Communication: Not applicable Written Expression Dominant Hand: Right Written Expression: Not tested   Oral / Motor  Oral Motor/Sensory Function Overall Oral Motor/Sensory Function: Within functional limits Motor  Speech Overall Motor Speech: Appears  within functional limits for tasks assessed Respiration: Within functional limits Phonation: Normal Resonance: Within functional limits Articulation: Within functional limitis Intelligibility: Intelligible Motor Planning: Witnin functional limits Motor Speech Errors: Not applicable   GO                    Marylee Belzer 12/12/2018, 9:40 AM

## 2018-12-12 NOTE — Evaluation (Signed)
Physical Therapy Evaluation Patient Details Name: [name] MRN: [id number] DOB: Jun 20, 1926 Today's Date: 12/12/2018   History of Present Illness  [name] is a 85 y.o. female with medical history significant for L breast cancer status post lumpectomy and no radiation but history of anastrozole, hypertension, hyperlipidemia, hypothyroidism, history of anxiety, history of pacemaker with defibrillator, history of pelvic fracture and other comorbidities who presents to the ED with strokelike symptoms of confusion and weakness. CT was negative for any acute abnormalities.     Clinical Impression  Pt presented sitting OOB in recliner chair, awake and willing to participate in therapy session. Prior to admission, pt reported that she ambulated with use of a cane and was independent with ADLs. Pt lives at [place]. She is currently at min guard to supervision level for all functional mobility with use of her cane to ambulate. Pt with mild instability with gait; however, no overt LOB or need for physical assistance. PT will continue to follow acutely to progress mobility as tolerated and to ensure a safe d/c home.     Follow Up Recommendations No PT follow up    Equipment Recommendations  None recommended by PT    Recommendations for Other Services       Precautions / Restrictions Precautions Precautions: Fall Restrictions Weight Bearing Restrictions: No      Mobility  Bed Mobility Overal bed mobility: Needs Assistance Bed Mobility: Supine to Sit     Supine to sit: Supervision     General bed mobility comments: pt sitting OOB in recliner chair upon arrival  Transfers Overall transfer level: Needs assistance Equipment used: Quad cane;None Transfers: Sit to/from Stand Sit to Stand: Supervision Stand pivot transfers: Min guard       General transfer comment: supervision for safety, no instability noted  Ambulation/Gait Ambulation/Gait assistance: Min  guard Gait Distance (Feet): 100 Feet Assistive device: Quad cane Gait Pattern/deviations: Step-through pattern;Decreased stride length;Shuffle Gait velocity: decreased   General Gait Details: pt with mild instability but no overt LOB or need for physical assistance, min guard for safety, no difficulties navigating around obstacles in hallway  Stairs            Wheelchair Mobility    Modified Rankin (Stroke Patients Only)       Balance Overall balance assessment: Needs assistance Sitting-balance support: Feet supported;No upper extremity supported Sitting balance-Leahy Scale: Good     Standing balance support: During functional activity Standing balance-Leahy Scale: Fair                               Pertinent Vitals/Pain Pain Assessment: No/denies pain    Home Living Family/patient expects to be discharged to:: Assisted living     Type of Home: Assisted living         Home Equipment: Latina Craver - 4 wheels      Prior Function Level of Independence: Independent with assistive device(s)         Comments: Assistance provided occasionally with meals, but pt and granddaughter present report she was managing own medication. She moved to [employer] following a fall at granddaughters home.     Hand Dominance   Dominant Hand: Right    Extremity/Trunk Assessment   Upper Extremity Assessment Upper Extremity Assessment: Defer to OT evaluation;Overall [facility] for tasks assessed    Lower Extremity Assessment Lower Extremity Assessment: Overall WFL for tasks assessed    Cervical /  Trunk Assessment Cervical / Trunk Assessment: Kyphotic  Communication   Communication: No difficulties  Cognition Arousal/Alertness: Awake/alert Behavior During Therapy: WFL for tasks assessed/performed Overall Cognitive Status: History of cognitive impairments - at baseline                                 General Comments: Family reports pt  is at baseline cognitively       General Comments General comments (skin integrity, edema, etc.): Granddaghter present and very supportive. Pt very pleasant and sweet    Exercises     Assessment/Plan    PT Assessment Patient needs continued PT services  PT Problem List Decreased balance;Decreased mobility;Decreased coordination       PT Treatment Interventions DME instruction;Gait training;Stair training;Functional mobility training;Therapeutic activities;Balance training;Therapeutic exercise;Neuromuscular re-education;Cognitive remediation;Patient/family education    PT Goals (Current goals can be found in the Care Plan section)  Acute Rehab PT Goals Patient Stated Goal: "to go home" PT Goal Formulation: With patient Time For Goal Achievement: 12/26/18 Potential to Achieve Goals: Good    Frequency Min 3X/week   Barriers to discharge        Co-evaluation               AM-PAC PT "6 Clicks" Mobility  Outcome Measure Help needed turning from your back to your side while in a flat bed without using bedrails?: A Little Help needed moving from lying on your back to sitting on the side of a flat bed without using bedrails?: A Little Help needed moving to and from a bed to a chair (including a wheelchair)?: A Little Help needed standing up from a chair using your arms (e.g., wheelchair or bedside chair)?: None Help needed to walk in hospital room?: A Little Help needed climbing 3-5 steps with a railing? : A Little 6 Click Score: 19    End of Session Equipment Utilized During Treatment: Gait belt Activity Tolerance: Patient tolerated treatment well Patient left: in chair;with call bell/phone within reach;with family/visitor present;Other (comment)(SLP present) Nurse Communication: Mobility status PT Visit Diagnosis: Other abnormalities of gait and mobility (R26.89)    Time: 8841-6606 PT Time Calculation (min) (ACUTE ONLY): 20 min   Charges:   PT Evaluation $PT  Eval Moderate Complexity: 1 Mod          Sherie Don, PT, DPT  Acute Rehabilitation Services Pager [phone] Office [place] 12/12/2018, 9:51 AM

## 2018-12-12 NOTE — Progress Notes (Signed)
  Echocardiogram 2D Echocardiogram has been performed.  [name] 12/12/2018, 3:14 PM

## 2018-12-12 NOTE — Progress Notes (Addendum)
STROKE TEAM PROGRESS NOTE   INTERVAL HISTORY Patient up in the chair 3 family members are at the bedside.  She recounted HPI. No hx stroke or TIA. Pt lives in an ALF - she tilted to the L while walking and sat at the dining table with flat affect and no response from her.  Pt claims she saw what was going on but she didn't know anyone and so she got up and left. The nurse went in to check on her -they ended up here in the hospital.  Family very concerned about her.  Multiple questions addressed and answered by Dr. Leonie Man.  Vitals:   12/11/18 2357 12/12/18 0000 12/12/18 0426 12/12/18 0735  BP: (!) 107/92 (!) 107/92 (!) 150/73 (!) 146/78  Pulse: 77 71 66 70  Resp: 12 17 (!) 23 17  Temp: 98.4 F (36.9 C)   98.5 F (36.9 C)  TempSrc: Oral   Oral  SpO2: 95% 95% (!) 70% 99%  Weight:   83.2 kg   Height:        CBC:  Recent Labs  Lab 12/11/18 1254 12/12/18 0518  WBC 9.6 9.1  NEUTROABS 6.3 6.2  HGB 13.7 13.5  HCT 41.7 41.6  MCV 94.3 93.5  PLT 256 094    Basic Metabolic Panel:  Recent Labs  Lab 12/11/18 1254 12/12/18 0518  NA 141 143  K 4.9 3.9  CL 109 108  CO2 24 25  GLUCOSE 100* 117*  BUN 13 9  CREATININE 0.76 0.82  CALCIUM 9.4 9.2  MG  --  2.1  PHOS  --  3.7   Lipid Panel:     Component Value Date/Time   CHOL 180 12/12/2018 0518   TRIG 67 12/12/2018 0518   HDL 49 12/12/2018 0518   CHOLHDL 3.7 12/12/2018 0518   VLDL 13 12/12/2018 0518   LDLCALC 118 (H) 12/12/2018 0518   HgbA1c:  Lab Results  Component Value Date   HGBA1C 6.1 (H) 12/12/2018   Urine Drug Screen:     Component Value Date/Time   LABOPIA NONE DETECTED 12/11/2018 1212   COCAINSCRNUR NONE DETECTED 12/11/2018 1212   LABBENZ NONE DETECTED 12/11/2018 1212   AMPHETMU NONE DETECTED 12/11/2018 1212   THCU NONE DETECTED 12/11/2018 1212   LABBARB NONE DETECTED 12/11/2018 1212    Alcohol Level No results found for: ETH  IMAGING Ct Angio Head W Or Wo Contrast  Result Date: 12/11/2018 CLINICAL DATA:   85 y/o F; episode of slurred speech. Evaluation of stroke. EXAM: CT ANGIOGRAPHY HEAD AND NECK TECHNIQUE: Multidetector CT imaging of the head and neck was performed using the standard protocol during bolus administration of intravenous contrast. Multiplanar CT image reconstructions and MIPs were obtained to evaluate the vascular anatomy. Carotid stenosis measurements (when applicable) are obtained utilizing NASCET criteria, using the distal internal carotid diameter as the denominator. CONTRAST:  159mL ISOVUE-370 IOPAMIDOL (ISOVUE-370) INJECTION 76% COMPARISON:  12/11/2018 CT head. FINDINGS: CTA NECK FINDINGS Aortic arch: Aberrant right subclavian artery period. Left common carotid artery arises from the right brachiocephalic artery. Shaggy fibrofatty plaque of the aortic arch. Predominant fibrofatty plaque of left subclavian artery origin with moderate 60% stenosis. Right carotid system: Calcified plaque of the carotid bifurcation with moderate 60% proximal ICA stenosis. No dissection or aneurysm. Left carotid system: No evidence of dissection, stenosis (50% or greater) or occlusion. Calcified plaque of the carotid bifurcation with mild less than 50% proximal ICA stenosis. Vertebral arteries: Codominant. No evidence of dissection, stenosis (50% or greater)  or occlusion. Skeleton: Negative. Other neck: Negative. Upper chest: Mild air trapping in the lung apices. Peribronchial thickening. Review of the MIP images confirms the above findings CTA HEAD FINDINGS Anterior circulation: No significant stenosis, proximal occlusion, aneurysm, or vascular malformation. Non stenotic calcific atherosclerosis of carotid siphons. Posterior circulation: No significant stenosis, proximal occlusion, aneurysm, or vascular malformation. Venous sinuses: As permitted by contrast timing, patent. Anatomic variants: Complete circle-of-Willis. Left fetal PCA. Delayed phase: No abnormal intracranial enhancement. Review of the MIP images  confirms the above findings IMPRESSION: CTA neck: 1. Extensive shaggy fibrofatty plaque of aortic arch. 2. Left subclavian artery origin moderate 60% stenosis with fibrofatty plaque. 3. Right proximal ICA moderate 60% stenosis with calcified plaque. 4. Patent left carotid and vertebral arteries. No hemodynamically significant stenosis by NASCET criteria. CTA head: 1. Patent anterior and posterior intracranial circulation. No large vessel occlusion, aneurysm, or significant stenosis is identified. Electronically Signed   By: Kristine Garbe M.D.   On: 12/11/2018 19:05   Dg Chest 2 View  Result Date: 12/11/2018 CLINICAL DATA:  Cough.  Stroke symptoms beginning today. EXAM: CHEST - 2 VIEW COMPARISON:  Two-view chest x-ray 12/25/2017 FINDINGS: The heart is mildly enlarged. Atherosclerotic changes are present at the aortic arch. There is no edema or effusion. No focal airspace disease is present. Pacing wires are stable. A remote T5 compression fracture is stable. Exaggerated thoracic kyphosis is again noted. IMPRESSION: 1. No acute cardiopulmonary disease. 2. Stable cardiomegaly without failure. 3. Changes of COPD. 4. Aortic atherosclerosis. Electronically Signed   By: San Morelle M.D.   On: 12/11/2018 11:52   Ct Head Wo Contrast  Result Date: 12/11/2018 CLINICAL DATA:  TIA. EXAM: CT HEAD WITHOUT CONTRAST TECHNIQUE: Contiguous axial images were obtained from the base of the skull through the vertex without intravenous contrast. COMPARISON:  12/25/2017 FINDINGS: Brain: Generalized atrophy. No evidence of acute infarction, mass lesion, hemorrhage, hydrocephalus or extra-axial collection. 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 Vascular: There is atherosclerotic calcification of the major vessels at the base of the brain. Skull: Negative Sinuses/Orbits: Clear/normal Other: None IMPRESSION: No acute finding by CT.  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 Electronically Signed   By: Nelson Chimes M.D.   On:  12/11/2018 11:42   Ct Angio Neck W Or Wo Contrast  Result Date: 12/11/2018 CLINICAL DATA:  85 y/o F; episode of slurred speech. Evaluation of stroke. EXAM: CT ANGIOGRAPHY HEAD AND NECK TECHNIQUE: Multidetector CT imaging of the head and neck was performed using the standard protocol during bolus administration of intravenous contrast. Multiplanar CT image reconstructions and MIPs were obtained to evaluate the vascular anatomy. Carotid stenosis measurements (when applicable) are obtained utilizing NASCET criteria, using the distal internal carotid diameter as the denominator. CONTRAST:  140mL ISOVUE-370 IOPAMIDOL (ISOVUE-370) INJECTION 76% COMPARISON:  12/11/2018 CT head. FINDINGS: CTA NECK FINDINGS Aortic arch: Aberrant right subclavian artery period. Left common carotid artery arises from the right brachiocephalic artery. Shaggy fibrofatty plaque of the aortic arch. Predominant fibrofatty plaque of left subclavian artery origin with moderate 60% stenosis. Right carotid system: Calcified plaque of the carotid bifurcation with moderate 60% proximal ICA stenosis. No dissection or aneurysm. Left carotid system: No evidence of dissection, stenosis (50% or greater) or occlusion. Calcified plaque of the carotid bifurcation with mild less than 50% proximal ICA stenosis. Vertebral arteries: Codominant. No evidence of dissection, stenosis (50% or greater) or occlusion. Skeleton: Negative. Other neck: Negative. Upper chest: Mild air trapping in the lung apices. Peribronchial thickening. Review of the MIP  images confirms the above findings CTA HEAD FINDINGS Anterior circulation: No significant stenosis, proximal occlusion, aneurysm, or vascular malformation. Non stenotic calcific atherosclerosis of carotid siphons. Posterior circulation: No significant stenosis, proximal occlusion, aneurysm, or vascular malformation. Venous sinuses: As permitted by contrast timing, patent. Anatomic variants: Complete circle-of-Willis. Left  fetal PCA. Delayed phase: No abnormal intracranial enhancement. Review of the MIP images confirms the above findings IMPRESSION: CTA neck: 1. Extensive shaggy fibrofatty plaque of aortic arch. 2. Left subclavian artery origin moderate 60% stenosis with fibrofatty plaque. 3. Right proximal ICA moderate 60% stenosis with calcified plaque. 4. Patent left carotid and vertebral arteries. No hemodynamically significant stenosis by NASCET criteria. CTA head: 1. Patent anterior and posterior intracranial circulation. No large vessel occlusion, aneurysm, or significant stenosis is identified. Electronically Signed   By: Kristine Garbe M.D.   On: 12/11/2018 19:05   2D echocardiogram pending   PHYSICAL EXAM Pleasant elderly Caucasian lady not in distress. . Afebrile. Head is nontraumatic. Neck is supple without bruit.    Cardiac exam no murmur or gallop. Lungs are clear to auscultation. Distal pulses are well felt. Neurological Exam ;  Awake  Alert oriented x 2.diminished attention, registration and recall. Follows one and two-step commands.. Normal speech and language.eye movements full without nystagmus.fundi were not visualized. Vision acuity and fields appear normal. Hearing is normal. Palatal movements are normal. Face symmetric. Tongue midline. Normal strength, tone, reflexes and coordination. Normal sensation. Gait deferred.   ASSESSMENT/PLAN Ms. ROGENE METH is a 85 y.o. female with history of thyroid disease, pacemaker, hypertension, hyperlipidemia, cancer, arthritis, breast cancer and pelvic fracture who is on Pradaxa for AF presenting with generalized weakness and confusion.   TIA from AF vs Complex Partial Seizure in a patient with mild cognitive impairment  CT head no new stroke.  Old left caudate lacune.  CTA head & neck extensive shaggy fibrofatty plaque aortic arch.  Left subclavian artery 60% stenosis with fibrofatty plaque.  Right ICA 60% stenosis with calcified plaque.  MRI   /MRA  Pacer not compatible   2D Echo  pending   EEG done, no sz actiivity  LDL 118  HgbA1c 6.1  paradaxa for VTE prophylaxis  Pradaxa (dabigatran) twice a day prior to admission, now on Pradaxa (dabigatran) twice a day. Continue at d/c. No indication to change agents or to add aspirin per Dr. Leonie Man  Therapy recommendations:  No therapy needs  Disposition:  pending (from ALF) [place] for d/c from stroke standpoint Follow-up Stroke Clinic at [facility] in 4 weeks. Office will call with appointment date and time. Order placed.  Atrial Fibrillation  Home anticoagulation:  Pradaxa (dabigatran) twice a day continued in the hospital . Continue Pradaxa (dabigatran) twice a day at discharge   Hypertension  Stable . BP goal normotensive  Hyperlipidemia  Home meds: fish oil  Lipitor 80 mg started on admission  LDL 118, goal < 53  Given age, will decrease Lipitor to 20 mg daily  Continue statin at discharge  Other Stroke Risk Factors  Advanced age  Obesity, Body mass index is 31.48 kg/m., recommend weight loss, diet and exercise as appropriate   Hx stroke/TIA per imaging only, no pt or family recall   Family hx stroke (father)  Pacer with defibrillator   Other Active Problems  Age related memory loss, poor short-term memory  Senile tremor (hand and chin). No classic parkinson's termor  History of left breast cancer  Hospital day # 0  Burnetta Sabin, MSN, APRN,  ANVP-BC, AGPCNP-BC Advanced Practice Stroke Nurse [place] for Schedule & Pager information 12/12/2018 11:48 AM  I have personally obtained history,examined this patient, reviewed notes, independently viewed imaging studies, participated in medical decision making and plan of care.ROS completed by me personally and pertinent positives fully documented  I have made any additions or clarifications directly to the above note. Agree with note above.  She presented with  transient episode of dizziness and gait ataxia along with some decreased memory and altered sensorium which appears to have resolved. Unclear whether this represents a post is circulation TIA or a complex partial seizure episode. Her neurovascular and EEG workup has been unyielding. Recommend aspirin and discharged back to assisted-living facility. Follow-up as an outpatient in neurology clinic. Long discussion with patient and daughter and granddaughter the bedside and answered questions. Discussed with Dr. Was 1. Greater than 50% time during this 35 minute  visit was spent on counseling and coordination of care about TIA versus seizure and answered questions.  Antony Contras, MD Medical Director [place] Pager: [phone] 12/12/2018 3:22 PM  To contact Stroke Continuity provider, please refer to [email or web address] After hours, contact General Neurology

## 2018-12-21 DIAGNOSIS — I4891 Unspecified atrial fibrillation: Secondary | ICD-10-CM | POA: Diagnosis not present

## 2018-12-21 DIAGNOSIS — E7800 Pure hypercholesterolemia, unspecified: Secondary | ICD-10-CM | POA: Diagnosis not present

## 2018-12-21 DIAGNOSIS — I10 Essential (primary) hypertension: Secondary | ICD-10-CM | POA: Diagnosis not present

## 2018-12-21 DIAGNOSIS — R7303 Prediabetes: Secondary | ICD-10-CM | POA: Diagnosis not present

## 2018-12-21 DIAGNOSIS — G459 Transient cerebral ischemic attack, unspecified: Secondary | ICD-10-CM | POA: Diagnosis not present

## 2018-12-21 DIAGNOSIS — C50912 Malignant neoplasm of unspecified site of left female breast: Secondary | ICD-10-CM | POA: Diagnosis not present

## 2019-02-27 DIAGNOSIS — Z95810 Presence of automatic (implantable) cardiac defibrillator: Secondary | ICD-10-CM | POA: Diagnosis not present

## 2019-04-30 DIAGNOSIS — R5383 Other fatigue: Secondary | ICD-10-CM | POA: Diagnosis not present

## 2019-05-02 DIAGNOSIS — R5383 Other fatigue: Secondary | ICD-10-CM | POA: Diagnosis not present

## 2019-05-15 DIAGNOSIS — H9202 Otalgia, left ear: Secondary | ICD-10-CM | POA: Insufficient documentation

## 2019-05-15 DIAGNOSIS — M26622 Arthralgia of left temporomandibular joint: Secondary | ICD-10-CM | POA: Diagnosis not present

## 2019-05-17 DIAGNOSIS — I10 Essential (primary) hypertension: Secondary | ICD-10-CM | POA: Diagnosis not present

## 2019-05-17 DIAGNOSIS — I472 Ventricular tachycardia: Secondary | ICD-10-CM | POA: Diagnosis not present

## 2019-05-17 DIAGNOSIS — Z95810 Presence of automatic (implantable) cardiac defibrillator: Secondary | ICD-10-CM | POA: Diagnosis not present

## 2019-05-17 DIAGNOSIS — I480 Paroxysmal atrial fibrillation: Secondary | ICD-10-CM | POA: Diagnosis not present

## 2019-05-29 DIAGNOSIS — Z95810 Presence of automatic (implantable) cardiac defibrillator: Secondary | ICD-10-CM | POA: Diagnosis not present

## 2019-06-25 DIAGNOSIS — E039 Hypothyroidism, unspecified: Secondary | ICD-10-CM | POA: Diagnosis not present

## 2019-08-01 ENCOUNTER — Ambulatory Visit (INDEPENDENT_AMBULATORY_CARE_PROVIDER_SITE_OTHER): Payer: PPO | Admitting: Family

## 2019-08-01 ENCOUNTER — Ambulatory Visit: Payer: PPO | Admitting: Nurse Practitioner

## 2019-08-01 ENCOUNTER — Other Ambulatory Visit: Payer: Self-pay

## 2019-08-01 ENCOUNTER — Encounter: Payer: Self-pay | Admitting: Family

## 2019-08-01 VITALS — BP 124/74 | HR 75 | Temp 97.1°F | Ht 64.0 in | Wt 171.4 lb

## 2019-08-01 DIAGNOSIS — R0982 Postnasal drip: Secondary | ICD-10-CM

## 2019-08-01 DIAGNOSIS — E039 Hypothyroidism, unspecified: Secondary | ICD-10-CM

## 2019-08-01 DIAGNOSIS — G8929 Other chronic pain: Secondary | ICD-10-CM | POA: Diagnosis not present

## 2019-08-01 DIAGNOSIS — M5441 Lumbago with sciatica, right side: Secondary | ICD-10-CM | POA: Diagnosis not present

## 2019-08-01 DIAGNOSIS — Z23 Encounter for immunization: Secondary | ICD-10-CM | POA: Diagnosis not present

## 2019-08-01 DIAGNOSIS — F411 Generalized anxiety disorder: Secondary | ICD-10-CM

## 2019-08-01 DIAGNOSIS — I83813 Varicose veins of bilateral lower extremities with pain: Secondary | ICD-10-CM

## 2019-08-01 DIAGNOSIS — E785 Hyperlipidemia, unspecified: Secondary | ICD-10-CM | POA: Diagnosis not present

## 2019-08-01 DIAGNOSIS — M810 Age-related osteoporosis without current pathological fracture: Secondary | ICD-10-CM

## 2019-08-01 DIAGNOSIS — I10 Essential (primary) hypertension: Secondary | ICD-10-CM

## 2019-08-01 LAB — COMPLETE METABOLIC PANEL WITH GFR
AG Ratio: 1.9 (calc) (ref 1.0–2.5)
ALT: 12 U/L (ref 6–29)
AST: 24 U/L (ref 10–35)
Albumin: 4.1 g/dL (ref 3.6–5.1)
Alkaline phosphatase (APISO): 35 U/L — ABNORMAL LOW (ref 37–153)
BUN: 11 mg/dL (ref 7–25)
CO2: 27 mmol/L (ref 20–32)
Calcium: 9.9 mg/dL (ref 8.6–10.4)
Chloride: 105 mmol/L (ref 98–110)
Creat: 0.76 mg/dL (ref 0.60–0.88)
GFR, Est African American: 80 mL/min/{1.73_m2} (ref >=60)
GFR, Est Non African American: 69 mL/min/{1.73_m2} (ref >=60)
Globulin: 2.2 g/dL (calc) (ref 1.9–3.7)
Glucose, Bld: 107 mg/dL — ABNORMAL HIGH (ref 65–99)
Potassium: 4.7 mmol/L (ref 3.5–5.3)
Sodium: 140 mmol/L (ref 135–146)
Total Bilirubin: 0.6 mg/dL (ref 0.2–1.2)
Total Protein: 6.3 g/dL (ref 6.1–8.1)

## 2019-08-01 LAB — CBC WITH DIFFERENTIAL/PLATELET
Absolute Monocytes: 496 cells/uL (ref 200–950)
Basophils Absolute: 27 cells/uL (ref 0–200)
Basophils Relative: 0.4 %
Eosinophils Absolute: 109 cells/uL (ref 15–500)
Eosinophils Relative: 1.6 %
HCT: 40.8 % (ref 35.0–45.0)
Hemoglobin: 13.4 g/dL (ref 11.7–15.5)
Lymphs Abs: 1700 cells/uL (ref 850–3900)
MCH: 30 pg (ref 27.0–33.0)
MCHC: 32.8 g/dL (ref 32.0–36.0)
MCV: 91.5 fL (ref 80.0–100.0)
MPV: 10.2 fL (ref 7.5–12.5)
Monocytes Relative: 7.3 %
Neutro Abs: 4468 cells/uL (ref 1500–7800)
Neutrophils Relative %: 65.7 %
Platelets: 247 10*3/uL (ref 140–400)
RBC: 4.46 10*6/uL (ref 3.80–5.10)
RDW: 13 % (ref 11.0–15.0)
Total Lymphocyte: 25 %
WBC: 6.8 10*3/uL (ref 3.8–10.8)

## 2019-08-01 LAB — LIPID PANEL
Cholesterol: 133 mg/dL (ref <200)
HDL: 51 mg/dL (ref >=50)
LDL Cholesterol (Calc): 62 mg/dL (calc)
Non-HDL Cholesterol (Calc): 82 mg/dL (calc) (ref <130)
Total CHOL/HDL Ratio: 2.6 (calc) (ref <5.0)
Triglycerides: 123 mg/dL (ref <150)

## 2019-08-01 LAB — TSH: TSH: 1.49 mIU/L (ref 0.40–4.50)

## 2019-08-01 MED ORDER — LORATADINE 10 MG PO TABS: 10.0000 mg | ORAL_TABLET | Freq: Every day | ORAL | 11 refills | Status: DC

## 2019-08-01 MED ORDER — FLUTICASONE PROPIONATE 50 MCG/ACT NA SUSP: 2.0000 | Freq: Every day | NASAL | 6 refills | Status: DC

## 2019-08-01 NOTE — Progress Notes (Signed)
Provider: [name] FNP-C   Ngetich, [name], NP  Patient Care Team: Ngetich, [name], NP as PCP - General (Family Medicine) [name], MD as Consulting Physician (Dermatology) [name], MD as Consulting Physician (Otolaryngology) [name], MD as Referring Physician (Cardiology)  Extended Emergency Contact Information Primary Emergency Contact: [name] [place] Phone: [phone] Relation: Daughter Secondary Emergency Contact: Hite,Linda Address: [address]          [place], [place] Phone: [phone] Mobile Phone: [phone] Relation: Daughter  Code Status:  Full Code  Goals of care: Advanced Directive information Advanced Directives 08/01/2019  Does Patient Have a Medical Advance Directive? Yes  Type of Advance Directive Living will;Healthcare Power of Attorney  Does patient want to make changes to medical advance directive? No - Patient declined  Copy of Rosa Sanchez in Chart? No - copy requested  Would patient like information on creating a medical advance directive? -     Chief Complaint  Patient presents with  . Establish Care    New Patient to establish care patient c/o of pain in jaw and head since July,  also lots of mucus in morning , varicose veins   . Medication Management    patient uses saline solution to help with nasal problems   . Quality Metric Gaps    Flu shot given in office     HPI:  Pt is a 85 y.o. female seen today at [facility] office to establish care for medical management of chronic diseases.she has a medical history of Hypertension,Afib,AICD,GERD,Hypothyroidism,chronic right side lower back pain ,Osteoarthritis,Osteoporosis, GAD,hyperlipidemia,insomnia,breast cancer among other conditions.  she complains of nasal drainage worst in the morning.she sometimes coughs mucus and has to spit in the tissue.she has had drainage running from her nose down to her  throat.she denies any fever,sore throat,watery eyes or irritation.she was advised to use saline nasal spray by her previous doctor but states doesn't work.  Fatigue - feels tired all the time.she moved out of the Assisted Living facility in [place] after testing negative for COVD-19.Family moved her to live with her Granddaughter.   TMJ- has chronic left jaw shooting pain to the left side of the head.she has seen ENT recommended applying ice bag which has been effective.   Chronic right knee pain - Has had cortisol injection given by Orthopedic.Takes Extra strength Tylenol 500 mg tablet as needed for pain.  Lower back pain - Gabapentin 600 mg tablet twice daily and Extra strength Tylenol 500 mg tablet as needed for pain.  RLS - Gabapentin  600 mg tablet twice daily as needed has been effective.  Varicose vein - this is her major concern states legs are painful worst on both thighs.states swelling of the veins are worsening.   Hypertension - no home blood pressure log for review.on metoprolol succinate 25 mg tablet daily.she denies any signs of hypotension.  Hypothyroidism - on levothyroxine 75 mcg tablet daily.previous TSH level within normal range.   Hyperlipidemia - on pravastatin 20 mg tablet daily.Does simple leg exercise stretches and walking within the house.   GAD- symptoms stable on Paxil 20 mg tablet daily.  Osteoporosis - on Calcium and vit D supplement. Last Bone Density done 03/31/2010 bilateral femur T-score - 2.3 and Radius - 1.4  Lumbar spine excluded.   Past Medical History:  Diagnosis Date  . Acid reflux   . Arthritis   . Atrial fibrillation ([place])   .  Breast cancer [facility]) 2011   Left Breast Cancer  . Cancer [facility]) 2011   Left Breast Cancer  . Depression   . Hyperlipidemia   . Hypertension   . Insomnia   . Osteoporosis   . Pacemaker 2010   [place]  . Pelvic fracture ([place]) 2010  . Thyroid disease    Past Surgical History:  Procedure Laterality Date  .  ABDOMINAL HYSTERECTOMY  35 yrs ago  . BREAST LUMPECTOMY Left 05/23/10  . CARDIAC DEFIBRILLATOR PLACEMENT  2 years ago  . CHOLECYSTECTOMY  50 YEARS AGO  . ELBOW SURGERY  2006    Allergies  Allergen Reactions  . Codeine Nausea Only    Allergies as of 08/01/2019      Reactions   Codeine Nausea Only      Medication List       Accurate as of August 01, 2019  2:59 PM. If you have any questions, ask your nurse or doctor.        ABC PLUS PO Take 1 tablet by mouth daily.   acetaminophen 500 MG tablet Commonly known as: TYLENOL Take 500 mg by mouth daily as needed for mild pain.   Calcium+D3 600-800 MG-UNIT Tabs Generic drug: Calcium Carb-Cholecalciferol Take 1 tablet by mouth every morning.   cholecalciferol 25 MCG (1000 UT) tablet Commonly known as: VITAMIN D3 Take 1,000 Units by mouth every morning.   fluticasone 50 MCG/ACT nasal spray Commonly known as: FLONASE Place 2 sprays into both nostrils daily. Started by: Sandrea Hughs, NP   gabapentin 600 MG tablet Commonly known as: NEURONTIN Take 300 mg by mouth as needed.   ketoconazole 2 % cream Commonly known as: NIZORAL Apply 1 application topically 2 (two) times daily as needed (for 2 weeks as needed for flares).   levothyroxine 75 MCG tablet Commonly known as: SYNTHROID Take 75 mcg by mouth daily before breakfast. Patient can not take generic synthroid   loratadine 10 MG tablet Commonly known as: CLARITIN Take 1 tablet (10 mg total) by mouth daily. Started by: Sandrea Hughs, NP   metoprolol succinate 25 MG 24 hr tablet Commonly known as: TOPROL-XL Take 25 mg by mouth daily. One in am one in pm   Omega-3 1000 MG Caps Take 1 capsule by mouth daily.   pantoprazole 40 MG tablet Commonly known as: PROTONIX Take 40 mg by mouth daily.   PARoxetine 20 MG tablet Commonly known as: PAXIL Take 20 mg by mouth every morning.   potassium chloride 10 MEQ tablet Commonly known as: K-DUR Take 10 mEq by  mouth every morning.   Pradaxa 75 MG Caps capsule Generic drug: dabigatran Take 75 mg by mouth 2 (two) times daily.   pravastatin 20 MG tablet Commonly known as: PRAVACHOL Take 20 mg by mouth daily.   traZODone 50 MG tablet Commonly known as: DESYREL Take 50 mg by mouth at bedtime.       Review of Systems  Constitutional: Positive for fatigue. Negative for appetite change, chills and fever.  HENT: Negative for congestion, rhinorrhea, sinus pressure, sinus pain, sneezing and sore throat.        TMJ  Eyes: Negative for discharge, redness and itching.  Respiratory: Negative for cough, chest tightness, shortness of breath and wheezing.   Cardiovascular: Negative for chest pain, palpitations and leg swelling.  Gastrointestinal: Negative for abdominal distention, abdominal pain, constipation, diarrhea, nausea and vomiting.  Endocrine: Negative for cold intolerance, heat intolerance, polydipsia, polyphagia and polyuria.  Genitourinary: Negative  for difficulty urinating, dysuria, flank pain, frequency and urgency.  Musculoskeletal: Positive for arthralgias, back pain and gait problem.       Right knee and back pain   Skin: Negative for color change, pallor and rash.  Neurological: Negative for dizziness, speech difficulty, weakness, light-headedness, numbness and headaches.  Psychiatric/Behavioral: Negative for agitation, confusion and sleep disturbance. The patient is not nervous/anxious.     Immunization History  Administered Date(s) Administered  . Fluad Quad(high Dose 65+) 08/01/2019  . Influenza, High Dose Seasonal PF 07/31/2018  . Influenza-Unspecified 06/12/2015  . Pneumococcal Conjugate-13 12/23/2016  . Pneumococcal Polysaccharide-23 10/12/2003  . Tdap 12/25/2017   Pertinent  Health Maintenance Due  Topic Date Due  . INFLUENZA VACCINE  06/08/2019  . DEXA SCAN  Completed  . PNA vac Low Risk Adult  Completed   Fall Risk  08/01/2019  Falls in the past year? 0  Number  falls in past yr: 0  Injury with Fall? 0    Vitals:   08/01/19 1355  BP: 124/74  Pulse: 75  Temp: (!) 97.1 F (36.2 C)  TempSrc: Temporal  SpO2: 96%  Weight: 171 lb 6.4 oz (77.7 kg)  Height: '5\' 4"'$  (1.626 m)   Body mass index is 29.42 kg/m. Physical Exam Constitutional:      General: She is not in acute distress.    Appearance: She is overweight. She is not ill-appearing.  HENT:     Head: Normocephalic.     Right Ear: Tympanic membrane, ear canal and external ear normal. There is no impacted cerumen.     Left Ear: Tympanic membrane, ear canal and external ear normal. There is no impacted cerumen.     Nose: No congestion or rhinorrhea.     Comments: Right nares mucosa edema noted    Mouth/Throat:     Mouth: Mucous membranes are moist.     Pharynx: Oropharynx is clear. No oropharyngeal exudate or posterior oropharyngeal erythema.  Eyes:     General: No scleral icterus.       Right eye: No discharge.        Left eye: No discharge.     Extraocular Movements: Extraocular movements intact.     Conjunctiva/sclera: Conjunctivae normal.     Pupils: Pupils are equal, round, and reactive to light.  Neck:     Musculoskeletal: Normal range of motion. No neck rigidity or muscular tenderness.     Vascular: No carotid bruit.  Cardiovascular:     Rate and Rhythm: Normal rate and regular rhythm.     Pulses: Normal pulses.     Heart sounds: Normal heart sounds. No murmur. No friction rub. No gallop.   Pulmonary:     Effort: Pulmonary effort is normal. No respiratory distress.     Breath sounds: Normal breath sounds. No wheezing, rhonchi or rales.  Chest:     Chest wall: No tenderness.  Abdominal:     General: Bowel sounds are normal. There is no distension.     Palpations: Abdomen is soft. There is no mass.     Tenderness: There is no abdominal tenderness. There is no right CVA tenderness, left CVA tenderness, guarding or rebound.  Musculoskeletal:        General: No tenderness.      Right lower leg: No edema.     Left lower leg: No edema.     Comments: Unsteady gait uses cane at home.   Lymphadenopathy:     Cervical: No cervical adenopathy.  Skin:  General: Skin is warm and dry.     Coloration: Skin is not pale.     Findings: No bruising, erythema or rash.  Neurological:     Mental Status: She is alert and oriented to person, place, and time.     Cranial Nerves: No cranial nerve deficit.     Sensory: No sensory deficit.     Motor: No weakness.     Coordination: Coordination normal.     Gait: Gait abnormal.  Psychiatric:        Mood and Affect: Mood normal.        Speech: Speech normal.        Behavior: Behavior normal.        Thought Content: Thought content normal.        Cognition and Memory: She exhibits impaired recent memory.        Judgment: Judgment normal.    Labs reviewed: Recent Labs    12/11/18 1254 12/12/18 0518  NA 141 143  K 4.9 3.9  CL 109 108  CO2 24 25  GLUCOSE 100* 117*  BUN 13 9  CREATININE 0.76 0.82  CALCIUM 9.4 9.2  MG  --  2.1  PHOS  --  3.7   Recent Labs    12/11/18 1254 12/12/18 0518  AST 28 22  ALT 16 15  ALKPHOS 43 39  BILITOT 0.5 1.1  PROT 6.7 6.5  ALBUMIN 3.8 3.5   Recent Labs    12/11/18 1254 12/12/18 0518  WBC 9.6 9.1  NEUTROABS 6.3 6.2  HGB 13.7 13.5  HCT 41.7 41.6  MCV 94.3 93.5  PLT 256 223   Lab Results  Component Value Date   TSH 3.970 12/11/2018   Lab Results  Component Value Date   HGBA1C 6.1 (H) 12/12/2018   Lab Results  Component Value Date   CHOL 180 12/12/2018   HDL 49 12/12/2018   LDLCALC 118 (H) 12/12/2018   TRIG 67 12/12/2018   CHOLHDL 3.7 12/12/2018    Significant Diagnostic Results in last 30 days:  No results found.  Assessment/Plan 1. Need for influenza vaccination Afebrile.No symptoms of URI's. - Flu Vaccine QUAD High Dose(Fluad)  2. Essential hypertension B/p at goal.continue on metoprolol succinate 25 mg tablet daily - CBC with Differential/Platelet -  CMP with eGFR([facility])  3. Hypothyroidism, unspecified type Lab Results  Component Value Date   TSH 3.970 12/11/2018   Reports feeling tired all the time.Continue on levothyroxine 75 mcg tablet daily - TSH level   4. Hyperlipidemia, unspecified hyperlipidemia type Continue on pravastatin 20 mg tablet daily.low carbohydrate,low saturated fats,high vegetable diet and exercise as tolerated.  - Lipid panel  5. Generalized anxiety disorder Symptoms under controlled.continue on Paxil 20 mg tablet daily.  6. Chronic right-sided low back pain with right-sided sciatica Continue on Tylenol 500 mg tablet daily as needed and heating pad.    7. Post-nasal drip Post nasal drainage right nares mucosa edema noted. - fluticasone (FLONASE) 50 MCG/ACT nasal spray; Place 2 sprays into both nostrils daily.  Dispense: 16 g; Refill: 6 - loratadine (CLARITIN) 10 MG tablet; Take 1 tablet (10 mg total) by mouth daily.  Dispense: 30 tablet; Refill: 11  8. Varicose veins of both lower extremities with pain Worsening leg pain.Unable to wear Ted hose. - Ambulatory referral to Vascular Surgery  9. Age-related osteoporosis without current pathological fracture Continue on Calcium and vit D supplement daily.Reviewed Last T-score femur -2.3 and Radius -1.4 ( 03/31/2010) - HM DEXA SCAN  Family/ staff Communication: Reviewed plan of care with patient and daughter.  Labs/tests ordered:  - CBC with Differential/Platelet - CMP with eGFR([facility]) - Lipid panel - TSH level  - [facility] DEXA SCAN  Dinah C Ngetich, NP

## 2019-08-05 NOTE — Addendum Note (Signed)
Addended byMarlowe Sax C on: 08/05/2019 11:14 AM   Modules accepted: Level of Service

## 2019-08-09 ENCOUNTER — Telehealth: Payer: Self-pay | Admitting: *Deleted

## 2019-08-09 DIAGNOSIS — E785 Hyperlipidemia, unspecified: Secondary | ICD-10-CM

## 2019-08-09 MED ORDER — PRAVASTATIN SODIUM 10 MG PO TABS: 10.0000 mg | ORAL_TABLET | Freq: Every day | ORAL | 3 refills | Status: DC

## 2019-08-09 MED ORDER — PRAVASTATIN SODIUM 10 MG PO TABS: 20.0000 mg | ORAL_TABLET | Freq: Every day | ORAL | 3 refills | Status: DC

## 2019-08-09 NOTE — Telephone Encounter (Signed)
[facility] called and stated that they needed clarification on patient's Pravastatin. Stated that originally patient has been taking Pravastatin 20mg  once daily.   A Rx was called in for Pravastatin 10mg  twice daily.   I reviewed patient's last OV note and lab. Patient is to reduce Pravastatin to 10mg  daily.  Per Dinah: 5.Total cholesterol,Triglyceride and LDL are within normal range.Recommend reducing Pravastatin from 20 mg to 10 mg tablet one by mouth daily to prevent muscle weakness and achiness.also recommend low carbohydrates,low saturated fats and high vegetable diet.Exercise as tolerated.  Rx corrected and sent to pharmacy.

## 2019-08-09 NOTE — Addendum Note (Signed)
Addended byMarlowe Sax C on: 08/09/2019 09:16 AM   Modules accepted: Orders

## 2019-08-22 ENCOUNTER — Other Ambulatory Visit: Payer: Self-pay | Admitting: Family

## 2019-08-23 ENCOUNTER — Other Ambulatory Visit: Payer: Self-pay

## 2019-08-23 DIAGNOSIS — I83893 Varicose veins of bilateral lower extremities with other complications: Secondary | ICD-10-CM

## 2019-08-26 ENCOUNTER — Telehealth (HOSPITAL_COMMUNITY): Payer: Self-pay | Admitting: *Deleted

## 2019-08-26 ENCOUNTER — Other Ambulatory Visit: Payer: Self-pay | Admitting: *Deleted

## 2019-08-26 MED ORDER — GABAPENTIN 600 MG PO TABS: ORAL_TABLET | ORAL | 1 refills | Status: DC

## 2019-08-26 NOTE — Telephone Encounter (Signed)
[facility] 

## 2019-08-26 NOTE — Telephone Encounter (Signed)

## 2019-08-27 ENCOUNTER — Ambulatory Visit (INDEPENDENT_AMBULATORY_CARE_PROVIDER_SITE_OTHER): Payer: PPO | Admitting: Vascular Surgery

## 2019-08-27 ENCOUNTER — Ambulatory Visit (HOSPITAL_COMMUNITY)
Admission: RE | Admit: 2019-08-27 | Discharge: 2019-08-27 | Disposition: A | Discharge status: Home / Self Care | Payer: PPO | Source: Ambulatory Visit | Attending: Vascular Surgery | Admitting: Vascular Surgery

## 2019-08-27 ENCOUNTER — Other Ambulatory Visit: Payer: Self-pay

## 2019-08-27 ENCOUNTER — Other Ambulatory Visit: Payer: Self-pay | Admitting: Family

## 2019-08-27 ENCOUNTER — Encounter: Payer: Self-pay | Admitting: Vascular Surgery

## 2019-08-27 VITALS — BP 143/75 | HR 73 | Temp 97.6°F | Resp 18 | Ht 64.0 in | Wt 168.6 lb

## 2019-08-27 DIAGNOSIS — Z1231 Encounter for screening mammogram for malignant neoplasm of breast: Secondary | ICD-10-CM

## 2019-08-27 DIAGNOSIS — I83893 Varicose veins of bilateral lower extremities with other complications: Secondary | ICD-10-CM

## 2019-08-27 NOTE — Progress Notes (Signed)
Vascular and Vein Specialist of [place]  Patient name: [name] MRN: [id number] DOB: December 17, 1925 Sex: female  REASON FOR CONSULT: Discuss lower extremity varicose veins  HPI: [name] is a 85 y.o. female, who is here today for discussion of lower extremity varicose veins.  She is here today with her daughter.  She is alert and oriented and appears younger than the stated age of 65.  She has had lower extremity pain.  This appears to be mainly related to degenerative disc disease.  She reports this is positional in her low back and can extend into both lower extremities somewhat more in the left than in the right.  She does have extensive superficial venous varicosities and has had these for many years.  She reports occasional mild tenderness in her left medial thigh associated with these.  No history of DVT.  Past Medical History:  Diagnosis Date  . Acid reflux   . Arthritis   . Atrial fibrillation ([place])   . Breast cancer [facility]) 2011   Left Breast Cancer  . Cancer [facility]) 2011   Left Breast Cancer  . Depression   . Hyperlipidemia   . Hypertension   . Insomnia   . Osteoporosis   . Pacemaker 2010   [place]  . Pelvic fracture ([place]) 2010  . Thyroid disease     Family History  Problem Relation Age of Onset  . Heart disease Mother   . Diabetes Father   . Stroke Father   . Cancer Sister        lung  . Cancer Brother        liver  . COPD Brother   . Heart attack Brother   . Stroke Brother   . Heart attack Brother     SOCIAL HISTORY: Social History   Socioeconomic History  . Marital status: Widowed    Spouse name: Not on file  . Number of children: Not on file  . Years of education: Not on file  . Highest education level: Not on file  Occupational History  . Not on file  Social Needs  . Financial resource strain: Not on file  . Food insecurity    Worry: Not on file    Inability: Not on file  . Transportation needs     Medical: Not on file    Non-medical: Not on file  Tobacco Use  . Smoking status: Never Smoker  . Smokeless tobacco: Never Used  Substance and Sexual Activity  . Alcohol use: No  . Drug use: Never  . Sexual activity: Not on file  Lifestyle  . Physical activity    Days per week: Not on file    Minutes per session: Not on file  . Stress: Not on file  Relationships  . Social Herbalist on phone: Not on file    Gets together: Not on file    Attends religious service: Not on file    Active member of club or organization: Not on file    Attends meetings of clubs or organizations: Not on file    Relationship status: Not on file  . Intimate partner violence    Fear of current or ex partner: Not on file    Emotionally abused: Not on file    Physically abused: Not on file    Forced sexual activity: Not on file  Other Topics Concern  . Not on file  Social History Narrative   Social History  Diet? None       Do you drink/eat things with caffeine? yes      Marital status?             Widowed                        What year were you married? 1946      Do you live in a house, apartment, assisted living, condo, trailer, etc.? house      Is it one or more stories?   2      How many persons live in your home?  3 others       Do you have any pets in your home? (please list)   2 dogs      Highest level of education completed? Junior high school ?      Current or past profession: N/A      Do you exercise?                   No                    Type & how often?        Advanced Directives      Do you have a living will? Yes       Do you have a DNR form?                                  If not, do you want to discuss one?      Do you have signed POA/HPOA for forms? Yes      Functional Status      Do you have difficulty bathing or dressing yourself?  No       Do you have difficulty preparing food or eating? no      Do you have difficulty managing your  medications?  Yes      Do you have difficulty managing your finances? Yes       Do you have difficulty affording your medications?  no       Allergies  Allergen Reactions  . Codeine Nausea Only    Current Outpatient Medications  Medication Sig Dispense Refill  . acetaminophen (TYLENOL) 500 MG tablet Take 500 mg by mouth daily as needed for mild pain.     . Calcium Carb-Cholecalciferol (CALCIUM+D3) 600-800 MG-UNIT TABS Take 1 tablet by mouth every morning.    . cholecalciferol (VITAMIN D3) 25 MCG (1000 UT) tablet Take 1,000 Units by mouth every morning.     . fluticasone (FLONASE) 50 MCG/ACT nasal spray Place 2 sprays into both nostrils daily. 16 g 6  . gabapentin (NEURONTIN) 600 MG tablet Take one tablet by mouth twice daily as needed 60 tablet 1  . ketoconazole (NIZORAL) 2 % cream Apply 1 application topically 2 (two) times daily as needed (for 2 weeks as needed for flares).    Marland Kitchen levothyroxine (SYNTHROID) 75 MCG tablet Take 75 mcg by mouth daily before breakfast. Patient can not take generic synthroid    . loratadine (CLARITIN) 10 MG tablet Take 1 tablet (10 mg total) by mouth daily. 30 tablet 11  . metoprolol succinate (TOPROL-XL) 25 MG 24 hr tablet Take 25 mg by mouth daily. One in am one in pm     . Multiple Vitamins-Minerals (ABC PLUS PO) Take 1 tablet by mouth daily.     Marland Kitchen  Omega-3 1000 MG CAPS Take 1 capsule by mouth daily.    . pantoprazole (PROTONIX) 40 MG tablet TAKE 1 TABLET BY MOUTH ONCE DAILY 90 tablet 3  . PARoxetine (PAXIL) 20 MG tablet Take 20 mg by mouth every morning.      . potassium chloride (K-DUR) 10 MEQ tablet Take 10 mEq by mouth every morning.     Marland Kitchen PRADAXA 75 MG CAPS capsule Take 75 mg by mouth 2 (two) times daily.     . pravastatin (PRAVACHOL) 10 MG tablet Take 1 tablet (10 mg total) by mouth daily. 30 tablet 3  . traZODone (DESYREL) 50 MG tablet Take 50 mg by mouth at bedtime.     No current facility-administered medications for this visit.     REVIEW OF  SYSTEMS:  [X]  denotes positive finding, [ ]  denotes negative finding Cardiac  Comments:  Chest pain or chest pressure:    Shortness of breath upon exertion:    Short of breath when lying flat:    Irregular heart rhythm:        Vascular    Pain in calf, thigh, or hip brought on by ambulation: x   Pain in feet at night that wakes you up from your sleep:     Blood clot in your veins:    Leg swelling:         Pulmonary    Oxygen at home:    Productive cough:     Wheezing:         Neurologic    Sudden weakness in arms or legs:     Sudden numbness in arms or legs:     Sudden onset of difficulty speaking or slurred speech:    Temporary loss of vision in one eye:     Problems with dizziness:         Gastrointestinal    Blood in stool:     Vomited blood:         Genitourinary    Burning when urinating:     Blood in urine:        Psychiatric    Major depression:         Hematologic    Bleeding problems:    Problems with blood clotting too easily:        Skin    Rashes or ulcers:        Constitutional    Fever or chills:      PHYSICAL EXAM: Vitals:   08/27/19 1104  BP: (!) 143/75  Pulse: 73  Resp: 18  Temp: 97.6 F (36.4 C)  SpO2: 98%  Weight: 76.5 kg  Height: 5\' 4"  (1.626 m)    GENERAL: The patient is a well-nourished female, in no acute distress. The vital signs are documented above. CARDIOVASCULAR: 2+ radial and 2+ dorsalis pedis pulses bilaterally.  Extensive superficial varicosities in both lower extremities in her thighs and extending down into her medial and lateral calves bilaterally.  No evidence of hemosiderin deposits or evidence of more progressive chronic venous insufficiency PULMONARY: There is good air exchange  ABDOMEN: Soft and non-tender  MUSCULOSKELETAL: There are no major deformities or cyanosis. NEUROLOGIC: No focal weakness or paresthesias are detected. SKIN: There are no ulcers or rashes noted. PSYCHIATRIC: The patient has a normal  affect.  DATA:  Noninvasive venous studies today revealed extensive reflux in her deep system bilaterally in the common femoral vein, femoral vein, popliteal veins bilaterally.  Also has reflux in her proximal great saphenous  vein bilaterally  MEDICAL ISSUES: I discussed these findings with the patient and her granddaughter.  She has attempted compression garments in the past is unsuccessful due to her inability to place these which is quite common.  She will elevate her legs when possible.  I reassured her that she does not have any evidence of DVT or other limb threatening issues and would recommend observation only with conservative treatment.  She is reassured with this discussion will see [place] again on an as-needed basis   Rosetta Posner, MD [facility] [place] [facility] Tel [phone] Pager [phone]

## 2019-09-05 DIAGNOSIS — Z95810 Presence of automatic (implantable) cardiac defibrillator: Secondary | ICD-10-CM | POA: Diagnosis not present

## 2019-09-05 DIAGNOSIS — Z4502 Encounter for adjustment and management of automatic implantable cardiac defibrillator: Secondary | ICD-10-CM | POA: Diagnosis not present

## 2019-09-05 DIAGNOSIS — I472 Ventricular tachycardia: Secondary | ICD-10-CM | POA: Diagnosis not present

## 2019-09-05 DIAGNOSIS — I4819 Other persistent atrial fibrillation: Secondary | ICD-10-CM | POA: Diagnosis not present

## 2019-09-06 ENCOUNTER — Encounter: Payer: Self-pay | Admitting: Vascular Surgery

## 2019-10-18 ENCOUNTER — Other Ambulatory Visit: Payer: Self-pay | Admitting: *Deleted

## 2019-10-18 MED ORDER — TRAZODONE HCL 50 MG PO TABS: 50.0000 mg | ORAL_TABLET | Freq: Every day | ORAL | 0 refills | Status: DC

## 2019-11-02 ENCOUNTER — Other Ambulatory Visit: Payer: Self-pay | Admitting: Family

## 2019-11-02 DIAGNOSIS — E785 Hyperlipidemia, unspecified: Secondary | ICD-10-CM

## 2019-11-06 ENCOUNTER — Other Ambulatory Visit: Payer: Self-pay

## 2019-11-06 ENCOUNTER — Ambulatory Visit (INDEPENDENT_AMBULATORY_CARE_PROVIDER_SITE_OTHER): Payer: PPO | Admitting: Adult Health

## 2019-11-06 ENCOUNTER — Encounter: Payer: Self-pay | Admitting: Adult Health

## 2019-11-06 DIAGNOSIS — B370 Candidal stomatitis: Secondary | ICD-10-CM | POA: Insufficient documentation

## 2019-11-06 DIAGNOSIS — J309 Allergic rhinitis, unspecified: Secondary | ICD-10-CM

## 2019-11-06 DIAGNOSIS — K625 Hemorrhage of anus and rectum: Secondary | ICD-10-CM | POA: Insufficient documentation

## 2019-11-06 MED ORDER — NYSTATIN 100000 UNIT/ML MT SUSP: 5.0000 mL | Freq: Four times a day (QID) | OROMUCOSAL | 0 refills | Status: AC

## 2019-11-06 MED ORDER — CETIRIZINE HCL 5 MG PO TABS: 5.0000 mg | ORAL_TABLET | Freq: Every day | ORAL | 0 refills | Status: DC

## 2019-11-06 NOTE — Progress Notes (Signed)
[facility] clinic  Provider:  Durenda Age - DNP  Code Status:  DNR  Goals of Care:  Advanced Directives 08/27/2019  Does Patient Have a Medical Advance Directive? Yes  Type of Paramedic of [place];Living will  Does patient want to make changes to medical advance directive? No - Patient declined  Copy of [place] in Chart? -  Would patient like information on creating a medical advance directive? -     Chief Complaint  Patient presents with  . Acute Visit    Rectal bleeding, patient states this morning after urinating she noticed bleeding, denies any further bleeding and had bm this afternoon and did not notice any bleeding then. Patient states yesterday c/o lower abdominal pain.   . Medication Management    flonase and claritin have not been helping with sinus congestion     HPI: Patient is a 85 y.o. female seen today for an acute visit for rectal bleeding and nasal congestion. She came with her granddaughter. It was reported that 2 nights ago she had lower abdominal pain. This morning she had bowel movement and did not notice any blood on her stool. However, when she wiped her anus, she noticed slight bright red blood on the tissue. She denies having constipation. She has atrial fibrillation. She takes Pradaxa for chronic anticoagulation. Latest hgb 13.4 and platelets 247, normal, 08/01/19. She denies hematuria nor dysuria. No reported fever. Rectal exam done and no noted hemorrhoids, no blood gloves and no hard stools noted.   She complains of having post nasal drip draining down her throat and she has to clear the thick mucus on her throat all the time. Noted to have thick brownish coating on her mouth. She takes Flonase nasal spray and Claritin for her allergic rhinitis. Granddaughter would like to try other medications. She denies oral pain.   Past Medical History:  Diagnosis Date  . Acid reflux   . Arthritis   . Atrial  fibrillation ([place])   . Breast cancer [facility]) 2011   Left Breast Cancer  . Cancer [facility]) 2011   Left Breast Cancer  . Depression   . Hyperlipidemia   . Hypertension   . Insomnia   . Osteoporosis   . Pacemaker 2010   [place]  . Pelvic fracture ([place]) 2010  . Thyroid disease     Past Surgical History:  Procedure Laterality Date  . ABDOMINAL HYSTERECTOMY  35 yrs ago  . BREAST LUMPECTOMY Left 05/23/10  . CARDIAC DEFIBRILLATOR PLACEMENT  2 years ago  . CHOLECYSTECTOMY  50 YEARS AGO  . ELBOW SURGERY  2006    Allergies  Allergen Reactions  . Codeine Nausea Only    Outpatient Encounter Medications as of 11/06/2019  Medication Sig  . acetaminophen (MAPAP ARTHRITIS PAIN) 650 MG CR tablet Take 650 mg by mouth every 8 (eight) hours as needed for pain.  . Calcium Carb-Cholecalciferol (CALCIUM+D3) 600-800 MG-UNIT TABS Take 1 tablet by mouth every morning.  . cholecalciferol (VITAMIN D3) 25 MCG (1000 UT) tablet Take 1,000 Units by mouth every morning.   . fluticasone (FLONASE) 50 MCG/ACT nasal spray Place 2 sprays into both nostrils daily.  Marland Kitchen gabapentin (NEURONTIN) 600 MG tablet Take one tablet by mouth twice daily as needed  . gabapentin (NEURONTIN) 600 MG tablet Take 300 mg by mouth 2 (two) times daily as needed.  Marland Kitchen ketoconazole (NIZORAL) 2 % cream Apply 1 application topically 2 (two) times daily as needed (for 2 weeks  as needed for flares).  Marland Kitchen levothyroxine (SYNTHROID) 75 MCG tablet Take 75 mcg by mouth daily before breakfast. Patient can not take generic synthroid  . loratadine (CLARITIN) 10 MG tablet Take 1 tablet (10 mg total) by mouth daily.  . metoprolol succinate (TOPROL-XL) 25 MG 24 hr tablet Take 25 mg by mouth daily. One in am one in pm   . Multiple Vitamins-Minerals (ABC PLUS PO) Take 1 tablet by mouth daily.   . Omega-3 1000 MG CAPS Take 1 capsule by mouth daily.  . pantoprazole (PROTONIX) 40 MG tablet TAKE 1 TABLET BY MOUTH ONCE DAILY  . PARoxetine (PAXIL) 20 MG tablet  Take 20 mg by mouth every morning.    . potassium chloride (K-DUR) 10 MEQ tablet Take 10 mEq by mouth every morning.   Marland Kitchen PRADAXA 75 MG CAPS capsule Take 75 mg by mouth 2 (two) times daily.   . pravastatin (PRAVACHOL) 10 MG tablet TAKE 1 TABLET (10 MG TOTAL) BY MOUTH DAILY.  . traZODone (DESYREL) 50 MG tablet Take 1 tablet (50 mg total) by mouth at bedtime.  . [DISCONTINUED] acetaminophen (TYLENOL) 500 MG tablet Take 500 mg by mouth daily as needed for mild pain.    No facility-administered encounter medications on file as of 11/06/2019.    Review of Systems:  Review of Systems  Constitutional: Negative for activity change, appetite change and fever.  HENT: Positive for postnasal drip. Negative for congestion, mouth sores, rhinorrhea, sinus pressure, sinus pain and sore throat.   Eyes: Negative for discharge and itching.  Respiratory: Negative for cough, chest tightness, shortness of breath and wheezing.   Cardiovascular: Negative for chest pain and leg swelling.  Gastrointestinal: Positive for abdominal pain and anal bleeding. Negative for abdominal distention, constipation, nausea, rectal pain and vomiting.  Endocrine: Negative.  Negative for cold intolerance.  Genitourinary: Negative for difficulty urinating, dysuria and flank pain.  Musculoskeletal: Negative for back pain, gait problem and joint swelling.  Skin: Negative for color change, rash and wound.  Psychiatric/Behavioral: Positive for confusion. Negative for agitation and behavioral problems.    Health Maintenance  Topic Date Due  . TETANUS/TDAP  12/26/2027  . INFLUENZA VACCINE  Completed  . DEXA SCAN  Completed  . PNA vac Low Risk Adult  Completed    Physical Exam: Vitals:   11/06/19 1448  BP: 122/78  Pulse: 72  Temp: (!) 96.2 F (35.7 C)  TempSrc: Temporal  SpO2: 97%  Weight: 169 lb (76.7 kg)  Height: 5\' 4"  (1.626 m)   Body mass index is 29.01 kg/m. Physical Exam Constitutional:      General: She is not in  acute distress.    Appearance: She is obese.  HENT:     Head: Normocephalic.     Right Ear: External ear normal.     Left Ear: External ear normal.     Nose: No congestion or rhinorrhea.     Mouth/Throat:     Mouth: Mucous membranes are moist.     Comments: Tongue has thick brownish coating Eyes:     Conjunctiva/sclera: Conjunctivae normal.  Cardiovascular:     Rate and Rhythm: Normal rate and regular rhythm.     Pulses: Normal pulses.     Heart sounds: Normal heart sounds.     Comments: Has left chest pacemaker Pulmonary:     Effort: Pulmonary effort is normal.     Breath sounds: Normal breath sounds. No wheezing.  Abdominal:     General: Bowel sounds are normal.  There is no distension.     Palpations: Abdomen is soft.     Tenderness: There is abdominal tenderness.     Comments: Slight lower abdominal tenderness  Musculoskeletal:        General: No swelling or tenderness. Normal range of motion.     Cervical back: Normal range of motion and neck supple.  Skin:    General: Skin is warm and dry.     Findings: No bruising.  Neurological:     General: No focal deficit present.     Mental Status: She is alert. Mental status is at baseline.  Psychiatric:        Mood and Affect: Mood normal.        Behavior: Behavior normal.        Thought Content: Thought content normal.        Judgment: Judgment normal.     Labs reviewed: Basic Metabolic Panel: Recent Labs    12/11/18 1254 12/11/18 1530 12/12/18 0518 08/01/19 1507  NA 141  --  143 140  K 4.9  --  3.9 4.7  CL 109  --  108 105  CO2 24  --  25 27  GLUCOSE 100*  --  117* 107*  BUN 13  --  9 11  CREATININE 0.76  --  0.82 0.76  CALCIUM 9.4  --  9.2 9.9  MG  --   --  2.1  --   PHOS  --   --  3.7  --   TSH  --  3.970  --  1.49   Liver Function Tests: Recent Labs    12/11/18 1254 12/12/18 0518 08/01/19 1507  AST 28 22 24   ALT 16 15 12   ALKPHOS 43 39  --   BILITOT 0.5 1.1 0.6  PROT 6.7 6.5 6.3  ALBUMIN 3.8  3.5  --    CBC: Recent Labs    12/11/18 1254 12/12/18 0518 08/01/19 1507  WBC 9.6 9.1 6.8  NEUTROABS 6.3 6.2 4,468  HGB 13.7 13.5 13.4  HCT 41.7 41.6 40.8  MCV 94.3 93.5 91.5  PLT 256 223 247   Lipid Panel: Recent Labs    12/12/18 0518 08/01/19 1507  CHOL 180 133  HDL 49 51  LDLCALC 118* 62  TRIG 67 123  CHOLHDL 3.7 2.6   Lab Results  Component Value Date   HGBA1C 6.1 (H) 12/12/2018     Assessment/Plan  1. Rectal bleed - no noted blood on rectal exam, no hard stools noted, no hemorrhoids - on chronic anticoagulation with Pradaxa - CBC with Differential/Platelets - Urinalysis with Reflex Microscopic - Fecal Globin By Immunochemistry; Future  2. Allergic rhinitis, unspecified seasonality, unspecified trigger - will discontinue Claritin and start on Zyrtec, continue Flonase - cetirizine (ZYRTEC) 5 MG tablet; Take 1 tablet (5 mg total) by mouth at bedtime.  Dispense: 30 tablet; Refill: 0  3. Oral yeast infection - tongue has thick brownish coating, instructed to brush tongue daily and start on Nystatin suspension - nystatin (MYCOSTATIN) 100000 UNIT/ML suspension; Take 5 mLs (500,000 Units total) by mouth 4 (four) times daily for 14 days. Swish and spit  Dispense: 60 mL; Refill: 0    Labs/tests ordered: CBC with Differential/Platelets - Urinalysis with Reflex Microscopic - Fecal Globin By Immunochemistry; Future  Next appt:  12/02/2019

## 2019-11-06 NOTE — Patient Instructions (Addendum)
Rectal Bleeding  Rectal bleeding is when blood comes out of the opening of the butt (anus). People with this kind of bleeding may notice bright red blood in their underwear or in the toilet after they poop (have a bowel movement). They may also have dark red or black poop (stool). Rectal bleeding is often a sign that something is wrong. It needs to be checked by a doctor. Follow these instructions at home: Watch for any changes in your condition. Take these actions to help with bleeding and discomfort:  Eat a diet that is high in fiber. This will keep your poop soft so it is easier for you to poop without pushing too hard. Ask your doctor to tell you what foods and drinks are high in fiber.  Drink enough fluid to keep your pee (urine) clear or pale yellow. This also helps keep your poop soft.  Try taking a warm bath. This may help with pain.  Keep all follow-up visits as told by your doctor. This is important. Get help right away if:  You have new bleeding.  You have more bleeding than before.  You have black or dark red poop.  You throw up (vomit) blood or something that looks like coffee grounds.  You have pain or tenderness in your belly (abdomen).  You have a fever.  You feel weak.  You feel sick to your stomach (nauseous).  You pass out (faint).  You have very bad pain in your butt.  You cannot poop. This information is not intended to replace advice given to you by your health care provider. Make sure you discuss any questions you have with your health care provider. Document Released: 07/06/2011 Document Revised: 10/06/2017 Document Reviewed: 12/20/2015 Elsevier Patient Education  2020 [employer].  Allergic Rhinitis, Adult Allergic rhinitis is a reaction to allergens in the air. Allergens are tiny specks (particles) in the air that cause your body to have an allergic reaction. This condition cannot be passed from person to person (is not contagious). Allergic  rhinitis cannot be cured, but it can be controlled. There are two types of allergic rhinitis: Seasonal. This type is also called hay fever. It happens only during certain times of the year. Perennial. This type can happen at any time of the year. What are the causes? This condition may be caused by: Pollen from grasses, trees, and weeds. House dust mites. Pet dander. Mold. What are the signs or symptoms? Symptoms of this condition include: Sneezing. Runny or stuffy nose (nasal congestion). A lot of mucus in the back of the throat (postnasal drip). Itchy nose. Tearing of the eyes. Trouble sleeping. Being sleepy during day. How is this treated? There is no cure for this condition. You should avoid things that trigger your symptoms (allergens). Treatment can help to relieve symptoms. This may include: Medicines that block allergy symptoms, such as antihistamines. These may be given as a shot, nasal spray, or pill. Shots that are given until your body becomes less sensitive to the allergen (desensitization). Stronger medicines, if all other treatments have not worked. Follow these instructions at home: Avoiding allergens  Find out what you are allergic to. Common allergens include smoke, dust, and pollen. Avoid them if you can. These are some of the things that you can do to avoid allergens: Replace carpet with wood, tile, or vinyl flooring. Carpet can trap dander and dust. Clean any mold found in the home. Do not smoke. Do not allow smoking in your home. Change  your heating and air conditioning filter at least once a month. During allergy season: Keep windows closed as much as you can. If possible, use air conditioning when there is a lot of pollen in the air. Use a special filter for allergies with your furnace and air conditioner. Plan outdoor activities when pollen counts are lowest. This is usually during the early morning or evening hours. If you do go outdoors when pollen  count is high, wear a special mask for people with allergies. When you come indoors, take a shower and change your clothes before sitting on furniture or bedding. General instructions Do not use fans in your home. Do not hang clothes outside to dry. Wear sunglasses to keep pollen out of your eyes. Wash your hands right away after you touch household pets. Take over-the-counter and prescription medicines only as told by your doctor. Keep all follow-up visits as told by your doctor. This is important. Contact a doctor if: You have a fever. You have a cough that does not go away (is persistent). You start to make whistling sounds when you breathe (wheeze). Your symptoms do not get better with treatment. You have thick fluid coming from your nose. You start to have nosebleeds. Get help right away if: Your tongue or your lips are swollen. You have trouble breathing. You feel dizzy or you feel like you are going to pass out (faint). You have cold sweats. Summary Allergic rhinitis is a reaction to allergens in the air. This condition may be caused by allergens. These include pollen, dust mites, pet dander, and mold. Symptoms include a runny, itchy nose, sneezing, or tearing eyes. You may also have trouble sleeping or feel sleepy during the day. Treatment includes taking medicines and avoiding allergens. You may also get shots or take stronger medicines. Get help if you have a fever or a cough that does not stop. Get help right away if you are short of breath. This information is not intended to replace advice given to you by your health care provider. Make sure you discuss any questions you have with your health care provider. Document Released: 02/23/2011 Document Revised: 02/12/2019 Document Reviewed: 05/15/2018 Elsevier Patient Education  2020 [employer], Adult  Oral thrush is an infection in your mouth and throat. It causes white patches on your tongue and in your  mouth. Follow these instructions at home: Helping with soreness   To lessen your pain: ? Drink cold liquids, like water and iced tea. ? Eat frozen ice pops or frozen juices. ? Eat foods that are easy to swallow, like gelatin and ice cream. ? Drink from a straw if the patches in your mouth are painful. General instructions  Take or use over-the-counter and prescription medicines only as told by your doctor. Medicine for oral thrush may be something to swallow, or it may be something to put on the infected area.  Eat plain yogurt that has live cultures in it. Read the label to make sure.  If you wear dentures: ? Take out your dentures before you go to bed. ? Brush them well. ? Soak them in a denture cleaner.  Rinse your mouth with warm salt-water many times a day. To make the salt-water mixture, completely dissolve 1/2-1 teaspoon of salt in 1 cup of warm water. Contact a doctor if:  Your problems are getting worse.  Your problems do not get better in less than 7 days with treatment.  Your infection is spreading.  This may show as white patches on the skin outside of your mouth.  You are nursing your baby and you have redness and pain in the nipples. This information is not intended to replace advice given to you by your health care provider. Make sure you discuss any questions you have with your health care provider. Document Released: 01/18/2010 Document Revised: 01/26/2018 Document Reviewed: 07/18/2016 Elsevier Patient Education  2020 [employer].

## 2019-11-07 ENCOUNTER — Telehealth: Payer: Self-pay | Admitting: Nurse Practitioner

## 2019-11-07 LAB — URINALYSIS, ROUTINE W REFLEX MICROSCOPIC
Bilirubin Urine: NEGATIVE
Glucose, UA: NEGATIVE
Hgb urine dipstick: NEGATIVE
Hyaline Cast: NONE SEEN /LPF
Nitrite: NEGATIVE
Specific Gravity, Urine: 1.017 (ref 1.001–1.03)
pH: 8 (ref 5.0–8.0)

## 2019-11-07 LAB — CBC WITH DIFFERENTIAL/PLATELET
Absolute Monocytes: 710 cells/uL (ref 200–950)
Basophils Absolute: 36 cells/uL (ref 0–200)
Basophils Relative: 0.4 %
Eosinophils Absolute: 164 cells/uL (ref 15–500)
Eosinophils Relative: 1.8 %
HCT: 41.6 % (ref 35.0–45.0)
Hemoglobin: 13.7 g/dL (ref 11.7–15.5)
Lymphs Abs: 1784 cells/uL (ref 850–3900)
MCH: 30 pg (ref 27.0–33.0)
MCHC: 32.9 g/dL (ref 32.0–36.0)
MCV: 91 fL (ref 80.0–100.0)
MPV: 10.4 fL (ref 7.5–12.5)
Monocytes Relative: 7.8 %
Neutro Abs: 6406 cells/uL (ref 1500–7800)
Neutrophils Relative %: 70.4 %
Platelets: 239 10*3/uL (ref 140–400)
RBC: 4.57 10*6/uL (ref 3.80–5.10)
RDW: 12.8 % (ref 11.0–15.0)
Total Lymphocyte: 19.6 %
WBC: 9.1 10*3/uL (ref 3.8–10.8)

## 2019-11-07 NOTE — Telephone Encounter (Signed)
The patient's granddaughter Erline Levine called to check the patient's  UA result, expressed her concern about the patient's possible UTI. The message from [facility] today was waiting for urine culture, relayed  the message to Paul. The patient is at her baseline health, instructed to monitor for s/s of UTI and condition changes.

## 2019-11-12 NOTE — Telephone Encounter (Addendum)
Patient Grand daughter called back requesting the results from patient's Urine Culture. Stated that the patient does have increased confusion. No Fever, but the CBC blood work shows infection per [employer]. She feels like she has a UTI. Patient was seen on 11/06/2019 by Monina.  I reviewed the chart and no Urine Culture. I had Lab Tech call [facility] and they stated Urine Culture was never ordered or ran. Please Advise.

## 2019-11-12 NOTE — Telephone Encounter (Signed)
Please obtain urine for culture and sensitivity. I thought urine culture was done but wasn't.

## 2019-11-13 ENCOUNTER — Ambulatory Visit: Payer: PPO

## 2019-11-13 ENCOUNTER — Other Ambulatory Visit: Payer: PPO

## 2019-11-13 ENCOUNTER — Telehealth: Payer: Self-pay

## 2019-11-13 NOTE — Telephone Encounter (Signed)
Marzetta Board, patient's granddaughter called.  She said she will come get a specimen cup and bring specimen back to the office, but wanted to know if the UA warranted doing that.  She was able to see the results in MyChart, but did not know how to interpret them.  I told her I would send a message to the provider to interpret the results, and then we could order the C&S if necessary.

## 2019-11-13 NOTE — Telephone Encounter (Signed)
Urinalysis showed wbc 6-10 (normal 0-5), protein trace and bacteria few. If patient is still confused, then culture can be done to rule out UTI. Thanks.

## 2019-11-13 NOTE — Telephone Encounter (Signed)
I called Stacy, granddaughter and gave her interpretation.  I told her that if her grandmother was still having confusion she would need to do a C&S, and that we could have someone pick up a specimen cup and bring back to the office.  GD stated she would talk with her mother and decide what to do next.  She wasn't sure if confusion could be due to a UTI or age.

## 2019-11-16 ENCOUNTER — Other Ambulatory Visit: Payer: Self-pay | Admitting: Adult Health

## 2019-11-16 DIAGNOSIS — K625 Hemorrhage of anus and rectum: Secondary | ICD-10-CM | POA: Diagnosis not present

## 2019-11-19 LAB — FECAL GLOBIN BY IMMUNOCHEMISTRY
FECAL GLOBIN RESULT:: DETECTED — AB
MICRO NUMBER:: 10027910
SPECIMEN QUALITY:: ADEQUATE

## 2019-11-29 ENCOUNTER — Other Ambulatory Visit: Payer: Self-pay | Admitting: Adult Health

## 2019-11-29 DIAGNOSIS — J309 Allergic rhinitis, unspecified: Secondary | ICD-10-CM

## 2019-12-02 ENCOUNTER — Ambulatory Visit (INDEPENDENT_AMBULATORY_CARE_PROVIDER_SITE_OTHER): Payer: PPO | Admitting: Family

## 2019-12-02 ENCOUNTER — Encounter: Payer: Self-pay | Admitting: Family

## 2019-12-02 ENCOUNTER — Other Ambulatory Visit: Payer: Self-pay

## 2019-12-02 VITALS — BP 120/78 | HR 73 | Temp 97.7°F | Ht 64.0 in | Wt 166.0 lb

## 2019-12-02 DIAGNOSIS — F419 Anxiety disorder, unspecified: Secondary | ICD-10-CM

## 2019-12-02 DIAGNOSIS — R399 Unspecified symptoms and signs involving the genitourinary system: Secondary | ICD-10-CM

## 2019-12-02 DIAGNOSIS — E785 Hyperlipidemia, unspecified: Secondary | ICD-10-CM | POA: Diagnosis not present

## 2019-12-02 DIAGNOSIS — I10 Essential (primary) hypertension: Secondary | ICD-10-CM | POA: Diagnosis not present

## 2019-12-02 DIAGNOSIS — K591 Functional diarrhea: Secondary | ICD-10-CM | POA: Diagnosis not present

## 2019-12-02 DIAGNOSIS — H6122 Impacted cerumen, left ear: Secondary | ICD-10-CM | POA: Diagnosis not present

## 2019-12-02 DIAGNOSIS — F329 Major depressive disorder, single episode, unspecified: Secondary | ICD-10-CM | POA: Diagnosis not present

## 2019-12-02 DIAGNOSIS — J309 Allergic rhinitis, unspecified: Secondary | ICD-10-CM

## 2019-12-02 DIAGNOSIS — E039 Hypothyroidism, unspecified: Secondary | ICD-10-CM

## 2019-12-02 DIAGNOSIS — G4700 Insomnia, unspecified: Secondary | ICD-10-CM

## 2019-12-02 LAB — POCT URINALYSIS DIPSTICK
Bilirubin, UA: NEGATIVE
Blood, UA: NEGATIVE
Glucose, UA: NEGATIVE
Ketones, UA: NEGATIVE
Leukocytes, UA: NEGATIVE
Nitrite, UA: NEGATIVE
Protein, UA: NEGATIVE
Spec Grav, UA: 1.015 (ref 1.010–1.025)
Urobilinogen, UA: 0.2 E.U./dL
pH, UA: 6 (ref 5.0–8.0)

## 2019-12-02 MED ORDER — ALIGN 4 MG PO CAPS: 1.0000 | ORAL_CAPSULE | Freq: Every day | ORAL | 3 refills | Status: AC

## 2019-12-02 NOTE — Progress Notes (Signed)
Provider: [name] FNP-C   [name], Alicia Bucks, NP  Patient Care Team: [name], Alicia Bucks, NP as PCP - General (Family Medicine) Alicia Bookbinder, MD as Consulting Physician (Dermatology) Alicia Belfast, MD as Consulting Physician (Otolaryngology) Alicia Menghini, MD as Referring Physician (Cardiology)  Extended Emergency Contact Information Primary Emergency Contact: [name] [place] Phone: [phone] Work Phone: [phone] Relation: Daughter Secondary Emergency Contact: [name] [place] Phone: [phone] Mobile Phone: [phone] Relation: Granddaughter  Code Status: Full Code  Goals of care: Advanced Directive information Advanced Directives 12/02/2019  Does Patient Have a Medical Advance Directive? Yes  Type of Paramedic of [place];Living will  Does patient want to make changes to medical advance directive? No - Patient declined  Copy of [place] in Chart? Yes - validated most recent copy scanned in chart (See row information)  Would patient like information on creating a medical advance directive? -     Chief Complaint  Patient presents with  . Medical Management of Chronic Issues    4MTH FOLLOW-UP    HPI:  Pt is a 86 y.o. female seen today for 4 month follow up for medical management of chronic diseases.she is here with her [place] daughter [name].she complains of bilateral lower abdominal and lower back pain wonders if she has urinary tract infection.she had U/A done in December,2020 ordered by Alicia Plants NP  which indicated cloudy urine,trace Ketones,Protein,Negative Nitrite,Leuk 2+,WBC 6-10,few bacteria and moderate amorphous sediment.No urine culture done. She has been drinking cranberry Juice.she denies any fever,chills,N/V,urine frequency,urgecy or dysuria. Also had rectal bleeding on last visit described as blood on tissue when wiping self after using the  toilet.Fecal Globin detected monitoring was advised by  [name] ,NP since her Hgb level was stable 13.7 previous 13.4.suspected bleeding due to constipation.she denies any further rectal bleeding.  Also complains of intermittent diarrhea.No blood in stool,abdominal cramping,pain or fever.POA request Probiotics.     Hypertension - no home blood pressure log for review.on metoprolol succinate 25 mg 24 Hr tablet daily.she denies any signs of hypotension  Hyperlipidemia - on pravastatin 10 mg tablet daily and Omega- 3 1000 mg capsule daily.Latest LDL at goal. Tries to add veggies in diet.  Hypothyroidism - on levothyroxine 75 mcg tablet daily Takes medication on empty stomach.  Depression / Anxiety - states has been on Paxil 20 mg tablet daily does not think it's working anymore.Also on Trazodone 50 mg tablet daily at bedtime.would like Paxil discontinued.discussed increasing dose to 30 mg tablet since she has taken up to 40 mg tablet in the past.POA has noticed that she gets up set easily.she was up set since her appointment was cancel for COVID-19 vaccine due to no vaccine.she declines medication adjustment will continue with current dose then notify provider if symptoms worsen.   Insomnia - Trazodone 50 mg tablet daily effective.  Past Medical History:  Diagnosis Date  . Acid reflux   . Arthritis   . Atrial fibrillation ([place])   . Breast cancer [facility]) 2011   Left Breast Cancer  . Cancer [facility]) 2011   Left Breast Cancer  . Depression   . Hyperlipidemia   . Hypertension   . Insomnia   . Osteoporosis   . Pacemaker 2010   [place]  . Pelvic fracture ([place]) 2010  . Thyroid disease    Past Surgical History:  Procedure Laterality Date  . ABDOMINAL HYSTERECTOMY  35 yrs ago  . BREAST LUMPECTOMY  Left 05/23/10  . CARDIAC DEFIBRILLATOR PLACEMENT  2 years ago  . CHOLECYSTECTOMY  50 YEARS AGO  . ELBOW SURGERY  2006    Allergies  Allergen Reactions  . Codeine Nausea Only     Allergies as of 12/02/2019      Reactions   Codeine Nausea Only      Medication List       Accurate as of December 02, 2019  4:19 PM. If you have any questions, ask your nurse or doctor.        STOP taking these medications   fluticasone 50 MCG/ACT nasal spray Commonly known as: FLONASE Stopped by: Alicia Hughs, NP     TAKE these medications   ABC PLUS PO Take 1 tablet by mouth daily.   Calcium+D3 600-800 MG-UNIT Tabs Generic drug: Calcium Carb-Cholecalciferol Take 1 tablet by mouth every morning.   cetirizine 5 MG tablet Commonly known as: ZYRTEC TAKE 1 TABLET BY MOUTH AT BEDTIME.   cholecalciferol 25 MCG (1000 UNIT) tablet Commonly known as: VITAMIN D3 Take 1,000 Units by mouth every morning.   gabapentin 600 MG tablet Commonly known as: NEURONTIN Take 300 mg by mouth 2 (two) times daily as needed. What changed: Another medication with the same name was removed. Continue taking this medication, and follow the directions you see here. Changed by: Alicia Hughs, NP   ketoconazole 2 % cream Commonly known as: NIZORAL Apply 1 application topically 2 (two) times daily as needed (for 2 weeks as needed for flares).   levothyroxine 75 MCG tablet Commonly known as: SYNTHROID Take 75 mcg by mouth daily before breakfast. Patient can not take generic synthroid   Mapap Arthritis Pain 650 MG CR tablet Generic drug: acetaminophen Take 650 mg by mouth every 8 (eight) hours as needed for pain.   metoprolol succinate 25 MG 24 hr tablet Commonly known as: TOPROL-XL Take 25 mg by mouth daily. One in am one in pm   Omega-3 1000 MG Caps Take 1 capsule by mouth daily.   pantoprazole 40 MG tablet Commonly known as: PROTONIX TAKE 1 TABLET BY MOUTH ONCE DAILY   PARoxetine 20 MG tablet Commonly known as: PAXIL Take 20 mg by mouth every morning.   potassium chloride 10 MEQ tablet Commonly known as: KLOR-CON Take 10 mEq by mouth every morning.   Pradaxa 75 MG Caps  capsule Generic drug: dabigatran Take 75 mg by mouth 2 (two) times daily.   pravastatin 10 MG tablet Commonly known as: PRAVACHOL TAKE 1 TABLET (10 MG TOTAL) BY MOUTH DAILY.   traZODone 50 MG tablet Commonly known as: DESYREL Take 1 tablet (50 mg total) by mouth at bedtime.       Review of Systems  Constitutional: Negative for appetite change, chills, fatigue and fever.  HENT: Positive for hearing loss, postnasal drip and rhinorrhea. Negative for congestion, sinus pressure, sinus pain, sneezing and sore throat.        Wears hearing aids but forgot them at home today.follows up with [facility] for hearing check.  Eyes: Positive for visual disturbance. Negative for pain, discharge, redness and itching.  Respiratory: Negative for cough, choking, chest tightness, shortness of breath and wheezing.   Cardiovascular: Negative for chest pain, palpitations and leg swelling.  Gastrointestinal: Negative for abdominal distention, anal bleeding, constipation, nausea and vomiting.       Lower abdomen pain   Endocrine: Negative for cold intolerance, heat intolerance, polydipsia, polyphagia and polyuria.  Genitourinary: Positive for urgency. Negative for difficulty  urinating, dysuria, flank pain and frequency.  Musculoskeletal: Positive for gait problem. Negative for myalgias.  Skin: Negative for color change, pallor and rash.  Neurological: Negative for dizziness, speech difficulty, weakness, numbness and headaches.  Hematological: Does not bruise/bleed easily.  Psychiatric/Behavioral: Negative for agitation and confusion. The patient is not nervous/anxious.        Trazodone effective for sleep  Depression     Immunization History  Administered Date(s) Administered  . Fluad Quad(high Dose 65+) 08/01/2019  . Influenza, High Dose Seasonal PF 07/31/2018  . Influenza-Unspecified 06/12/2015  . Pneumococcal Conjugate-13 12/23/2016  . Pneumococcal Polysaccharide-23 10/12/2003  . Tdap 12/25/2017    Pertinent  Health Maintenance Due  Topic Date Due  . INFLUENZA VACCINE  Completed  . DEXA SCAN  Completed  . PNA vac Low Risk Adult  Completed   Fall Risk  12/02/2019 11/06/2019 08/01/2019  Falls in the past year? 0 0 0  Number falls in past yr: 0 0 0  Injury with Fall? 0 0 0    Vitals:   12/02/19 1600  BP: 120/78  Pulse: 73  Temp: 97.7 F (36.5 C)  TempSrc: Oral  SpO2: 97%  Weight: 166 lb (75.3 kg)  Height: '5\' 4"'$  (1.626 m)   Body mass index is 28.49 kg/m. Physical Exam Vitals reviewed.  Constitutional:      General: She is not in acute distress.    Appearance: She is overweight. She is not ill-appearing.  HENT:     Head: Normocephalic.     Right Ear: Tympanic membrane, ear canal and external ear normal. There is no impacted cerumen.     Left Ear: There is impacted cerumen.     Nose: Nose normal. No congestion or rhinorrhea.     Mouth/Throat:     Mouth: Mucous membranes are moist.     Pharynx: Oropharynx is clear. No oropharyngeal exudate or posterior oropharyngeal erythema.  Eyes:     General: No scleral icterus.       Right eye: No discharge.        Left eye: No discharge.     Extraocular Movements: Extraocular movements intact.     Conjunctiva/sclera: Conjunctivae normal.     Pupils: Pupils are equal, round, and reactive to light.  Neck:     Vascular: No carotid bruit.  Cardiovascular:     Rate and Rhythm: Normal rate and regular rhythm.     Pulses: Normal pulses.     Heart sounds: Normal heart sounds. No murmur. No friction rub. No gallop.   Pulmonary:     Effort: Pulmonary effort is normal. No respiratory distress.     Breath sounds: Normal breath sounds. No wheezing, rhonchi or rales.  Chest:     Chest wall: No tenderness.  Abdominal:     General: Bowel sounds are normal. There is no distension.     Palpations: Abdomen is soft. There is no mass.     Tenderness: There is no abdominal tenderness. There is no right CVA tenderness, left CVA tenderness,  guarding or rebound.  Musculoskeletal:        General: No swelling or tenderness.     Cervical back: Normal range of motion. No rigidity or tenderness.     Right lower leg: No edema.     Left lower leg: No edema.     Comments: Gait steady with a cane   Lymphadenopathy:     Cervical: No cervical adenopathy.  Skin:    General: Skin is warm and  dry.     Coloration: Skin is not pale.     Findings: No bruising, erythema or rash.  Neurological:     Mental Status: She is alert and oriented to person, place, and time.     Cranial Nerves: No cranial nerve deficit.     Sensory: No sensory deficit.     Motor: No weakness.     Coordination: Coordination normal.     Gait: Gait abnormal.     Comments: Walks with a cane   Psychiatric:        Mood and Affect: Mood normal.        Behavior: Behavior normal.        Thought Content: Thought content normal.        Cognition and Memory: Memory is impaired.        Judgment: Judgment normal.    Labs reviewed: Recent Labs    12/11/18 1254 12/12/18 0518 08/01/19 1507  NA 141 143 140  K 4.9 3.9 4.7  CL 109 108 105  CO2 '24 25 27  '$ GLUCOSE 100* 117* 107*  BUN '13 9 11  '$ CREATININE 0.76 0.82 0.76  CALCIUM 9.4 9.2 9.9  MG  --  2.1  --   PHOS  --  3.7  --    Recent Labs    12/11/18 1254 12/12/18 0518 08/01/19 1507  AST '28 22 24  '$ ALT '16 15 12  '$ ALKPHOS 43 39  --   BILITOT 0.5 1.1 0.6  PROT 6.7 6.5 6.3  ALBUMIN 3.8 3.5  --    Recent Labs    12/12/18 0518 08/01/19 1507 11/06/19 1551  WBC 9.1 6.8 9.1  NEUTROABS 6.2 4,468 6,406  HGB 13.5 13.4 13.7  HCT 41.6 40.8 41.6  MCV 93.5 91.5 91.0  PLT 223 247 239   Lab Results  Component Value Date   TSH 1.49 08/01/2019   Lab Results  Component Value Date   HGBA1C 6.1 (H) 12/12/2018   Lab Results  Component Value Date   CHOL 133 08/01/2019   HDL 51 08/01/2019   LDLCALC 62 08/01/2019   TRIG 123 08/01/2019   CHOLHDL 2.6 08/01/2019    Significant Diagnostic Results in last 30 days:    No results found.  Assessment/Plan 1. Allergic rhinitis, unspecified seasonality, unspecified trigger Ongoing issues with runny nose. cetirizine 5 mg tablet daily ineffective.  - Ambulatory referral to ENT  2. Essential hypertension B/p at goal.continue on metoprolol succinate 25 mg 24 Hr tablet daily - CBC with Differential/Platelet; Future - CMP with eGFR([facility]); Future  3. Hyperlipidemia, unspecified hyperlipidemia type LDL at goal.continue dietary modification and exercise.continue on pravastatin 10 mg tablet daily and Omega- 3 1000 mg capsule daily. - Lipid panel; Future  4. Hypothyroidism, unspecified type Lab Results  Component Value Date   TSH 1.49 08/01/2019  Continue on on levothyroxine 75 mcg tablet daily - TSH; Future  5. Anxiety and depression Has been easily irritable but declines Paxil to be increased.POA will notify provider if symptoms worst.will wean off Paxil and switch to sertraline. Paxil 20 mg tablet daily  6. Insomnia, unspecified type Continue on Trazodone 50 mg tablet daily.  7. Functional diarrhea On going.will add probiotics.will refer to GI if no improvement.  - Probiotic Product (ALIGN) 4 MG CAPS; Take 1 capsule (4 mg total) by mouth daily.  Dispense: 30 capsule; Refill: 3  8. Impacted cerumen of left ear TM not visualized.follows up with [facility] for hearing aids and does clean her ears too.  -  Ambulatory referral to ENT  9. Urinary symptom or sign Afebrile. Urine dip stick shows no signs of infections.Negative exam findings.continue on Cranberry juice would prefer tablets but likes the juice for now.  - POC Urinalysis Dipstick  Family/ staff Communication: Reviewed plan of care with patient  Labs/tests ordered:   - POC Urinalysis Dipstick - CBC with Differential/Platelet; Future - CMP with eGFR([facility]); Future - Lipid panel; Future - TSH; Future  Next Appointment : 6 months for medical management of chronic issues.Labs 2-4 days prior to  visit.   Alicia Hughs, NP

## 2019-12-05 DIAGNOSIS — Z95810 Presence of automatic (implantable) cardiac defibrillator: Secondary | ICD-10-CM | POA: Diagnosis not present

## 2019-12-10 NOTE — Telephone Encounter (Signed)
Sure,let's have a telephone visit with her so that I can adjust her Paxil to 40 mg Tablet.

## 2019-12-11 ENCOUNTER — Other Ambulatory Visit: Payer: Self-pay

## 2019-12-11 ENCOUNTER — Encounter: Payer: Self-pay | Admitting: Family

## 2019-12-11 ENCOUNTER — Ambulatory Visit (INDEPENDENT_AMBULATORY_CARE_PROVIDER_SITE_OTHER): Payer: PPO | Admitting: Family

## 2019-12-11 DIAGNOSIS — F329 Major depressive disorder, single episode, unspecified: Secondary | ICD-10-CM

## 2019-12-11 DIAGNOSIS — F419 Anxiety disorder, unspecified: Secondary | ICD-10-CM | POA: Diagnosis not present

## 2019-12-11 NOTE — Patient Instructions (Addendum)
- Increase Paxil from 20 mg tablet to 30 mg tablet one by mouth daily x 1 week then increase to 40 mg tablet daily Paroxetine tablets What is this medicine? PAROXETINE (pa ROX e teen) is used to treat depression. It may also be used to treat anxiety disorders, obsessive compulsive disorder, panic attacks, post traumatic stress, and premenstrual dysphoric disorder (PMDD). This medicine may be used for other purposes; ask your health care provider or pharmacist if you have questions. COMMON BRAND NAME(S): Paxil, Pexeva What should I tell my health care provider before I take this medicine? They need to know if you have any of these conditions:  bipolar disorder or a family history of bipolar disorder  bleeding disorders  glaucoma  heart disease  kidney disease  liver disease  low levels of sodium in the blood  seizures  suicidal thoughts, plans, or attempt; a previous suicide attempt by you or a family member  take MAOIs like Carbex, Eldepryl, Marplan, Nardil, and Parnate  take medicines that treat or prevent blood clots  thyroid disease  an unusual or allergic reaction to paroxetine, other medicines, foods, dyes, or preservatives  pregnant or trying to get pregnant  breast-feeding How should I use this medicine? Take this medicine by mouth with a glass of water. Follow the directions on the prescription label. You can take it with or without food. Take your medicine at regular intervals. Do not take your medicine more often than directed. Do not stop taking this medicine suddenly except upon the advice of your doctor. Stopping this medicine too quickly may cause serious side effects or your condition may worsen. A special MedGuide will be given to you by the pharmacist with each prescription and refill. Be sure to read this information carefully each time. Talk to your pediatrician regarding the use of this medicine in children. Special care may be needed. Overdosage: If you  think you have taken too much of this medicine contact a poison control center or emergency room at once. NOTE: This medicine is only for you. Do not share this medicine with others. What if I miss a dose? If you miss a dose, take it as soon as you can. If it is almost time for your next dose, take only that dose. Do not take double or extra doses. What may interact with this medicine? Do not take this medicine with any of the following medications:  linezolid  MAOIs like Carbex, Eldepryl, Marplan, Nardil, and Parnate  methylene blue (injected into a vein)  pimozide  thioridazine This medicine may also interact with the following medications:  alcohol  amphetamines  aspirin and aspirin-like medicines  atomoxetine  certain medicines for depression, anxiety, or psychotic disturbances  certain medicines for irregular heart beat like propafenone, flecainide, encainide, and quinidine  certain medicines for migraine headache like almotriptan, eletriptan, frovatriptan, naratriptan, rizatriptan, sumatriptan, zolmitriptan  cimetidine  digoxin  diuretics  fentanyl  fosamprenavir  furazolidone  isoniazid  lithium  medicines that treat or prevent blood clots like warfarin, enoxaparin, and dalteparin  medicines for sleep  NSAIDs, medicines for pain and inflammation, like ibuprofen or naproxen  phenobarbital  phenytoin  procarbazine  rasagiline  ritonavir  supplements like St. John's wort, kava kava, valerian  tamoxifen  tramadol  tryptophan This list may not describe all possible interactions. Give your health care provider a list of all the medicines, herbs, non-prescription drugs, or dietary supplements you use. Also tell them if you smoke, drink alcohol, or use illegal  drugs. Some items may interact with your medicine. What should I watch for while using this medicine? Tell your doctor if your symptoms do not get better or if they get worse. Visit your  doctor or health care professional for regular checks on your progress. Because it may take several weeks to see the full effects of this medicine, it is important to continue your treatment as prescribed by your doctor. Patients and their families should watch out for new or worsening thoughts of suicide or depression. Also watch out for sudden changes in feelings such as feeling anxious, agitated, panicky, irritable, hostile, aggressive, impulsive, severely restless, overly excited and hyperactive, or not being able to sleep. If this happens, especially at the beginning of treatment or after a change in dose, call your health care professional. Dennis Bast may get drowsy or dizzy. Do not drive, use machinery, or do anything that needs mental alertness until you know how this medicine affects you. Do not stand or sit up quickly, especially if you are an older patient. This reduces the risk of dizzy or fainting spells. Alcohol may interfere with the effect of this medicine. Avoid alcoholic drinks. Your mouth may get dry. Chewing sugarless gum or sucking hard candy, and drinking plenty of water will help. Contact your doctor if the problem does not go away or is severe. What side effects may I notice from receiving this medicine? Side effects that you should report to your doctor or health care professional as soon as possible:  allergic reactions like skin rash, itching or hives, swelling of the face, lips, or tongue  anxious  black, tarry stools  changes in vision  confusion  elevated mood, decreased need for sleep, racing thoughts, impulsive behavior  eye pain  fast, irregular heartbeat  feeling faint or lightheaded, falls  feeling agitated, angry, or irritable  hallucination, loss of contact with reality  loss of balance or coordination  loss of memory  painful or prolonged erections  restlessness, pacing, inability to keep still  seizures  stiff muscles  suicidal thoughts or  other mood changes  trouble sleeping  unusual bleeding or bruising  unusually weak or tired  vomiting Side effects that usually do not require medical attention (report to your doctor or health care professional if they continue or are bothersome):  change in appetite or weight  change in sex drive or performance  diarrhea  dizziness  dry mouth  increased sweating  indigestion, nausea  tired  tremors This list may not describe all possible side effects. Call your doctor for medical advice about side effects. You may report side effects to FDA at 1-800-FDA-1088. Where should I keep my medicine? Keep out of the reach of children. Store at room temperature between 15 and 30 degrees C (59 and 86 degrees F). Keep container tightly closed. Throw away any unused medicine after the expiration date. NOTE: This sheet is a summary. It may not cover all possible information. If you have questions about this medicine, talk to your doctor, pharmacist, or health care provider.  2020 Elsevier/Gold Standard (2016-03-26 15:50:32)

## 2019-12-11 NOTE — Progress Notes (Signed)
Patient ID: [name], female   DOB: 1925/11/15, 86 y.o.   MRN: [id number] This service is provided via telemedicine  No vital signs collected/recorded due to the encounter was a telemedicine visit.   Location of patient (ex: home, work):  HOME  Patient consents to a telephone visit:  YES  Location of the provider (ex: office, home):  OFFICE  Name of any referring provider:  [facility] Kayana Thoen, NP  Names of all persons participating in the telemedicine service and their role in the encounter:  PATIENT, [place], Edwin Dada, NP, [name], NP  Time spent on call:  4:26   Provider: Ione Sandusky FNP-C  Denali Sharma, [name], NP  Patient Care Team: Max Romano, [name], NP as PCP - General (Family Medicine) [name], MD as Consulting Physician (Dermatology) [name], MD as Consulting Physician (Otolaryngology) [name], MD as Referring Physician (Cardiology)  Extended Emergency Contact Information Primary Emergency Contact: [name] [place] of [place] Phone: [phone] Work Phone: [phone] Relation: Daughter Secondary Emergency Contact: [name] [place] of [place] Phone: [phone] Mobile Phone: [phone] Relation: Granddaughter  Code Status: Full Code  Goals of care: Advanced Directive information Advanced Directives 12/02/2019  Does Patient Have a Medical Advance Directive? Yes  Type of Paramedic of [place];Living will  Does patient want to make changes to medical advance directive? No - Patient declined  Copy of [place] in Chart? Yes - validated most recent copy scanned in chart (See row information)  Would patient like information on creating a medical advance directive? -     Chief Complaint  Patient presents with  . Acute Visit    discuss medications    HPI:  Pt is a 86 y.o. female seen today for an acute visit for depression.Patient's grand daughter Paula Libra present during visit.States talked with patient concerning Paxil medication adjustment since recent visit.patient agrees for medication to be titrate to 40 mg tablet daily for worsening depression symptoms. Also report about of diarrhea 12/07/2019 but took imodium with relief.she continues to take Probiotics.POA states patient eating much better and drinks fluid.  No fever,chills,abdominal pain or vomiting.     Past Medical History:  Diagnosis Date  . Acid reflux   . Arthritis   . Atrial fibrillation ([place])   . Breast cancer [facility]) 2011   Left Breast Cancer  . Cancer [facility]) 2011   Left Breast Cancer  . Depression   . Hyperlipidemia   . Hypertension   . Insomnia   . Osteoporosis   . Pacemaker 2010   [place]  . Pelvic fracture ([place]) 2010  . Thyroid disease    Past Surgical History:  Procedure Laterality Date  . ABDOMINAL HYSTERECTOMY  35 yrs ago  . BREAST LUMPECTOMY Left 05/23/10  . CARDIAC DEFIBRILLATOR PLACEMENT  2 years ago  . CHOLECYSTECTOMY  50 YEARS AGO  . ELBOW SURGERY  2006    Allergies  Allergen Reactions  . Codeine Nausea Only    Outpatient Encounter Medications as of 12/11/2019  Medication Sig  . acetaminophen (MAPAP ARTHRITIS PAIN) 650 MG CR tablet Take 650 mg by mouth every 8 (eight) hours as needed for pain.  . Calcium Carb-Cholecalciferol (CALCIUM+D3) 600-800 MG-UNIT TABS Take 1 tablet by mouth every morning.  . cetirizine (ZYRTEC) 5 MG tablet TAKE 1 TABLET BY MOUTH AT BEDTIME.  . cholecalciferol (VITAMIN D3) 25 MCG (1000 UT) tablet Take 1,000 Units by mouth every morning.   . gabapentin (  NEURONTIN) 600 MG tablet Take 300 mg by mouth 2 (two) times daily as needed.  Marland Kitchen ketoconazole (NIZORAL) 2 % cream Apply 1 application topically 2 (two) times daily as needed (for 2 weeks as needed for flares).  Marland Kitchen levothyroxine (SYNTHROID) 75 MCG tablet Take 75 mcg by mouth daily before breakfast. Patient can not take generic synthroid  . metoprolol succinate (TOPROL-XL)  25 MG 24 hr tablet Take 25 mg by mouth daily.   . Multiple Vitamins-Minerals (ABC PLUS PO) Take 1 tablet by mouth daily.   . Omega-3 1000 MG CAPS Take 1 capsule by mouth daily.  . pantoprazole (PROTONIX) 40 MG tablet TAKE 1 TABLET BY MOUTH ONCE DAILY  . potassium chloride (K-DUR) 10 MEQ tablet Take 10 mEq by mouth every morning.   Marland Kitchen PRADAXA 75 MG CAPS capsule Take 75 mg by mouth 2 (two) times daily.   . pravastatin (PRAVACHOL) 10 MG tablet TAKE 1 TABLET (10 MG TOTAL) BY MOUTH DAILY.  . Probiotic Product (ALIGN) 4 MG CAPS Take 1 capsule (4 mg total) by mouth daily.  . traZODone (DESYREL) 50 MG tablet Take 1 tablet (50 mg total) by mouth at bedtime.  Marland Kitchen PARoxetine (PAXIL) 20 MG tablet Take 20 mg by mouth every morning.     No facility-administered encounter medications on file as of 12/11/2019.    Review of Systems  Constitutional: Negative for appetite change, chills, fatigue and fever.  HENT: Positive for hearing loss. Negative for congestion, rhinorrhea, sinus pressure, sinus pain, sneezing and sore throat.   Respiratory: Negative for cough, chest tightness, shortness of breath and wheezing.   Cardiovascular: Negative for chest pain, palpitations and leg swelling.  Gastrointestinal: Negative for abdominal distention, abdominal pain, constipation, nausea and vomiting.       Diarrhea 12/07/19 imodium effective   Endocrine: Negative for cold intolerance, heat intolerance, polydipsia, polyphagia and polyuria.  Musculoskeletal: Positive for gait problem.  Skin: Negative for color change, pallor and rash.  Neurological: Negative for dizziness, speech difficulty, light-headedness and headaches.  Psychiatric/Behavioral: Negative for agitation, behavioral problems, confusion and sleep disturbance. The patient is not nervous/anxious.        Depressed     Immunization History  Administered Date(s) Administered  . Fluad Quad(high Dose 65+) 08/01/2019  . Influenza, High Dose Seasonal PF 07/31/2018   . Influenza-Unspecified 06/12/2015  . Pneumococcal Conjugate-13 12/23/2016  . Pneumococcal Polysaccharide-23 10/12/2003  . Tdap 12/25/2017   Pertinent  Health Maintenance Due  Topic Date Due  . INFLUENZA VACCINE  Completed  . DEXA SCAN  Completed  . PNA vac Low Risk Adult  Completed   Fall Risk  12/11/2019 12/02/2019 11/06/2019 08/01/2019  Falls in the past year? 0 0 0 0  Number falls in past yr: 0 0 0 0  Injury with Fall? 0 0 0 0    There were no vitals filed for this visit. There is no height or weight on file to calculate BMI. Physical Exam Unable to complete on Telephone visit.   Labs reviewed: Recent Labs    12/12/18 0518 08/01/19 1507  NA 143 140  K 3.9 4.7  CL 108 105  CO2 25 27  GLUCOSE 117* 107*  BUN 9 11  CREATININE 0.82 0.76  CALCIUM 9.2 9.9  MG 2.1  --   PHOS 3.7  --    Recent Labs    12/12/18 0518 08/01/19 1507  AST 22 24  ALT 15 12  ALKPHOS 39  --   BILITOT 1.1 0.6  PROT 6.5 6.3  ALBUMIN 3.5  --    Recent Labs    12/12/18 0518 08/01/19 1507 11/06/19 1551  WBC 9.1 6.8 9.1  NEUTROABS 6.2 4,468 6,406  HGB 13.5 13.4 13.7  HCT 41.6 40.8 41.6  MCV 93.5 91.5 91.0  PLT 223 247 239   Lab Results  Component Value Date   TSH 1.49 08/01/2019   Lab Results  Component Value Date   HGBA1C 6.1 (H) 12/12/2018   Lab Results  Component Value Date   CHOL 133 08/01/2019   HDL 51 08/01/2019   LDLCALC 62 08/01/2019   TRIG 123 08/01/2019   CHOLHDL 2.6 08/01/2019    Significant Diagnostic Results in last 30 days:  No results found.  Assessment/Plan  Anxiety and depression Depressive symptoms not effective on Paxil 20 mg tablet.Has taken 40 mg tablet in the past. - Will increase Paxil from 20 mg tablet to 30 mg tablet one by mouth daily x 1 week then increase to 40 mg tablet daily.side effects discussed paxil information provided on AVS today.  - Notify provider if symptoms worse or fail to improve.  - Follow up in 4 weeks for re-evaluation of  symptoms of depression.   Family/ staff Communication: Reviewed plan of care with patient and Granddaughter Paula Libra.  Labs/tests ordered: None   Next Appointment: 4 weeks for depression follow up.  Spent 15 minutes of non-face to face with patient    Sandrea Hughs, NP

## 2019-12-16 ENCOUNTER — Ambulatory Visit: Payer: PPO | Attending: Internal Medicine

## 2019-12-16 DIAGNOSIS — Z23 Encounter for immunization: Secondary | ICD-10-CM | POA: Insufficient documentation

## 2019-12-16 NOTE — Progress Notes (Signed)
   Covid-19 Vaccination Clinic  Name:  [name]    MRN: [id number] DOB: 04/17/1926  12/16/2019  Ms. [name] was observed post Covid-19 immunization for 15 minutes without incidence. She was provided with Vaccine Information Sheet and instruction to access the V-Safe system.   [name] was instructed to call 911 with any severe reactions post vaccine: Marland Kitchen Difficulty breathing  . Swelling of your face and throat  . A fast heartbeat  . A bad rash all over your body  . Dizziness and weakness    Immunizations Administered    Name Date Dose VIS Date Route   Pfizer COVID-19 Vaccine 12/16/2019 12:21 PM 0.3 mL 10/18/2019 Intramuscular   Manufacturer: [place]   Lot: [id number]   [place]: [id number]

## 2019-12-18 DIAGNOSIS — H6123 Impacted cerumen, bilateral: Secondary | ICD-10-CM | POA: Diagnosis not present

## 2019-12-18 DIAGNOSIS — R0982 Postnasal drip: Secondary | ICD-10-CM | POA: Diagnosis not present

## 2019-12-23 DIAGNOSIS — Z95810 Presence of automatic (implantable) cardiac defibrillator: Secondary | ICD-10-CM | POA: Diagnosis not present

## 2019-12-23 DIAGNOSIS — Z4502 Encounter for adjustment and management of automatic implantable cardiac defibrillator: Secondary | ICD-10-CM | POA: Diagnosis not present

## 2019-12-23 DIAGNOSIS — I4819 Other persistent atrial fibrillation: Secondary | ICD-10-CM | POA: Diagnosis not present

## 2020-01-04 ENCOUNTER — Ambulatory Visit: Payer: PPO

## 2020-01-08 ENCOUNTER — Encounter: Payer: Self-pay | Admitting: Family

## 2020-01-08 ENCOUNTER — Ambulatory Visit (INDEPENDENT_AMBULATORY_CARE_PROVIDER_SITE_OTHER): Payer: PPO | Admitting: Family

## 2020-01-08 ENCOUNTER — Other Ambulatory Visit: Payer: Self-pay

## 2020-01-08 VITALS — BP 118/62 | HR 73 | Temp 97.8°F | Resp 18 | Ht 64.0 in | Wt 165.0 lb

## 2020-01-08 DIAGNOSIS — F419 Anxiety disorder, unspecified: Secondary | ICD-10-CM

## 2020-01-08 DIAGNOSIS — Z7189 Other specified counseling: Secondary | ICD-10-CM | POA: Diagnosis not present

## 2020-01-08 DIAGNOSIS — F329 Major depressive disorder, single episode, unspecified: Secondary | ICD-10-CM | POA: Diagnosis not present

## 2020-01-08 DIAGNOSIS — J309 Allergic rhinitis, unspecified: Secondary | ICD-10-CM | POA: Diagnosis not present

## 2020-01-08 NOTE — Patient Instructions (Addendum)
-   Continue on Paxil 30 mg Tablet one by mouth daily.Notify provider if depression symptoms worsen  - Goal of care completed today.Please keep copy on a visible place at home.

## 2020-01-08 NOTE — Progress Notes (Addendum)
Provider: [name] FNP-C  Suesan Mohrmann, [name], NP  Patient Care Team: Tamyra Fojtik, [name], NP as PCP - General (Family Medicine) [name], MD as Consulting Physician (Dermatology) [name], MD as Consulting Physician (Otolaryngology) [name], MD as Referring Physician (Cardiology)  Extended Emergency Contact Information Primary Emergency Contact: [name] States of [place] Phone: [phone] Work Phone: [phone] Relation: Daughter Secondary Emergency Contact: [name] States of [place] Phone: [phone] Mobile Phone: [phone] Relation: Granddaughter  Code Status:  DNR Goals of care: Advanced Directive information Advanced Directives 12/02/2019  Does Patient Have a Medical Advance Directive? Yes  Type of Paramedic of [place];Living will  Does patient want to make changes to medical advance directive? No - Patient declined  Copy of [place] in Chart? Yes - validated most recent copy scanned in chart (See row information)  Would patient like information on creating a medical advance directive? -     Chief Complaint  Patient presents with  . Medical Management of Chronic Issues    4 WEEK FOLLOW-UP    HPI:  Pt is a 86 y.o. female seen today for an acute visit for follow up depression and anxiety.She is here with Granddaughter [employer].she was seen here 12/11/2019 with worsening depression.recommended increasing paxil to 30 mg tablet x 1 week then 40 mg tablet.she states felt much better after she increased dose to 30 mg tablet daily so talking with Granddaughter did need to increase to 40 mg tablet. She would like to complete her Advance care planning.she states had a Do not resuscitate order in the past.she would like to remain DNR, Comfort care with no further hospitalization.She would like She would likeI.V fluids for defined trial period if indicated and determine use or  limitation of antibiotic when infection occurs But no feeding tube.  No feeding tube.MOST form has been filled out today with patient and Granddaughter.  She states had a COVID-19 vaccine 12/16/2019 and due for second dose tomorrow 01/09/2020.  She was seen by ENT specialist as referred on previous visit.States bilateral cerumen removed.Zyrtec was discontinued encouraged to take Mucinex 600 mg tablet twice daily and to take Protonix 40 mg tablet at bedtime instead of morning.Humidifier at bedtime.she states symptoms have not improved still has sinus pressure and drainage and clearing of the throat.has upcoming appointment with ENT specialist.  She was also seen by [facility] at [facility]  her Defib devices was deactivated by patient request.  Past Medical History:  Diagnosis Date  . Acid reflux   . Arthritis   . Atrial fibrillation ([place])   . Breast cancer [facility]) 2011   Left Breast Cancer  . Cancer [facility]) 2011   Left Breast Cancer  . Depression   . Hyperlipidemia   . Hypertension   . Insomnia   . Osteoporosis   . Pacemaker 2010   [place]  . Pelvic fracture ([place]) 2010  . Thyroid disease    Past Surgical History:  Procedure Laterality Date  . ABDOMINAL HYSTERECTOMY  35 yrs ago  . BREAST LUMPECTOMY Left 05/23/10  . CARDIAC DEFIBRILLATOR PLACEMENT  2 years ago  . CHOLECYSTECTOMY  50 YEARS AGO  . ELBOW SURGERY  2006    Allergies  Allergen Reactions  . Codeine Nausea Only    Outpatient Encounter Medications as of 01/08/2020  Medication Sig  . acetaminophen (MAPAP ARTHRITIS PAIN) 650 MG CR tablet Take 650 mg by mouth every 8 (eight) hours  as needed for pain.  . Calcium Carb-Cholecalciferol (CALCIUM+D3) 600-800 MG-UNIT TABS Take 1 tablet by mouth every morning.  . cetirizine (ZYRTEC) 5 MG tablet TAKE 1 TABLET BY MOUTH AT BEDTIME.  . cholecalciferol (VITAMIN D3) 25 MCG (1000 UT) tablet Take 1,000 Units by mouth every morning.   . gabapentin (NEURONTIN) 600 MG  tablet Take 300 mg by mouth 2 (two) times daily as needed.  Marland Kitchen guaiFENesin (MUCINEX) 600 MG 12 hr tablet Take 600 mg by mouth 2 (two) times daily.  Marland Kitchen ketoconazole (NIZORAL) 2 % cream Apply 1 application topically 2 (two) times daily as needed (for 2 weeks as needed for flares).  Marland Kitchen levothyroxine (SYNTHROID) 75 MCG tablet Take 75 mcg by mouth daily before breakfast. Patient can not take generic synthroid  . metoprolol succinate (TOPROL-XL) 25 MG 24 hr tablet Take 25 mg by mouth daily.   . Multiple Vitamins-Minerals (ABC PLUS PO) Take 1 tablet by mouth daily.   . Omega-3 1000 MG CAPS Take 1 capsule by mouth daily.  . pantoprazole (PROTONIX) 40 MG tablet TAKE 1 TABLET BY MOUTH ONCE DAILY  . PARoxetine (PAXIL) 20 MG tablet Take 30 mg by mouth every morning.   . potassium chloride (K-DUR) 10 MEQ tablet Take 10 mEq by mouth every morning.   Marland Kitchen PRADAXA 75 MG CAPS capsule Take 75 mg by mouth 2 (two) times daily.   . pravastatin (PRAVACHOL) 10 MG tablet TAKE 1 TABLET (10 MG TOTAL) BY MOUTH DAILY.  . Probiotic Product (ALIGN) 4 MG CAPS Take 1 capsule (4 mg total) by mouth daily.  . traZODone (DESYREL) 50 MG tablet Take 1 tablet (50 mg total) by mouth at bedtime.   No facility-administered encounter medications on file as of 01/08/2020.    Review of Systems  Constitutional: Negative for activity change, chills, fatigue and fever.  HENT: Positive for postnasal drip. Negative for congestion, sinus pressure, sinus pain, sneezing, sore throat and trouble swallowing.   Respiratory: Negative for cough, chest tightness, shortness of breath and wheezing.   Cardiovascular: Negative for chest pain, palpitations and leg swelling.  Gastrointestinal: Negative for abdominal distention, abdominal pain, constipation, diarrhea, nausea and vomiting.  Genitourinary: Negative for difficulty urinating, dysuria, flank pain, frequency and urgency.  Musculoskeletal: Negative for arthralgias, back pain, gait problem and joint  swelling.  Skin: Negative for color change, pallor, rash and wound.  Neurological: Negative for dizziness, speech difficulty, weakness, light-headedness, numbness and headaches.  Psychiatric/Behavioral: Negative for agitation.       Depression and anxiety has improved with adjustment of paxil. Does not sleep well at night wakes up on and off.forgetful.     Immunization History  Administered Date(s) Administered  . Fluad Quad(high Dose 65+) 08/01/2019  . Influenza, High Dose Seasonal PF 07/31/2018  . Influenza-Unspecified 06/12/2015  . PFIZER SARS-COV-2 Vaccination 12/16/2019  . Pneumococcal Conjugate-13 12/23/2016  . Pneumococcal Polysaccharide-23 10/12/2003  . Tdap 12/25/2017   Pertinent  Health Maintenance Due  Topic Date Due  . INFLUENZA VACCINE  Completed  . DEXA SCAN  Completed  . PNA vac Low Risk Adult  Completed   Fall Risk  01/08/2020 12/11/2019 12/02/2019 11/06/2019 08/01/2019  Falls in the past year? 0 0 0 0 0  Number falls in past yr: 0 0 0 0 0  Injury with Fall? - 0 0 0 0    Vitals:   01/08/20 1308  BP: 118/62  Pulse: 73  Resp: 18  Temp: 97.8 F (36.6 C)  TempSrc: Oral  SpO2: 97%  Weight: 165 lb (74.8 kg)  Height: 5\' 4"  (1.626 m)   Body mass index is 28.32 kg/m. Physical Exam Vitals reviewed.  Constitutional:      General: She is not in acute distress.    Appearance: She is overweight. She is not ill-appearing.  Eyes:     General: No scleral icterus.       Right eye: No discharge.        Left eye: No discharge.     Extraocular Movements: Extraocular movements intact.     Conjunctiva/sclera: Conjunctivae normal.     Pupils: Pupils are equal, round, and reactive to light.  Neck:     Vascular: No carotid bruit.  Cardiovascular:     Rate and Rhythm: Normal rate and regular rhythm.     Pulses: Normal pulses.     Heart sounds: Normal heart sounds. No murmur. No friction rub. No gallop.   Pulmonary:     Effort: Pulmonary effort is normal. No respiratory  distress.     Breath sounds: Normal breath sounds. No wheezing, rhonchi or rales.  Chest:     Chest wall: No tenderness.  Abdominal:     General: Bowel sounds are normal. There is no distension.     Palpations: Abdomen is soft. There is no mass.     Tenderness: There is no abdominal tenderness. There is no right CVA tenderness, left CVA tenderness, guarding or rebound.  Musculoskeletal:        General: No swelling or tenderness.     Cervical back: Normal range of motion. No rigidity or tenderness.     Right lower leg: No edema.     Left lower leg: No edema.     Comments: Unsteady gait Ambulates with a cane   Lymphadenopathy:     Cervical: No cervical adenopathy.  Skin:    General: Skin is warm and dry.     Coloration: Skin is not pale.     Findings: No bruising or erythema.  Neurological:     Mental Status: She is alert. Mental status is at baseline.     Cranial Nerves: No cranial nerve deficit.     Sensory: No sensory deficit.     Motor: No weakness.     Gait: Gait abnormal.  Psychiatric:        Mood and Affect: Mood normal.        Behavior: Behavior normal.        Thought Content: Thought content normal.        Cognition and Memory: Memory is impaired.        Judgment: Judgment normal.    Labs reviewed: Recent Labs    08/01/19 1507  NA 140  K 4.7  CL 105  CO2 27  GLUCOSE 107*  BUN 11  CREATININE 0.76  CALCIUM 9.9   Recent Labs    08/01/19 1507  AST 24  ALT 12  BILITOT 0.6  PROT 6.3   Recent Labs    08/01/19 1507 11/06/19 1551  WBC 6.8 9.1  NEUTROABS 4,468 6,406  HGB 13.4 13.7  HCT 40.8 41.6  MCV 91.5 91.0  PLT 247 239   Lab Results  Component Value Date   TSH 1.49 08/01/2019   Lab Results  Component Value Date   HGBA1C 6.1 (H) 12/12/2018   Lab Results  Component Value Date   CHOL 133 08/01/2019   HDL 51 08/01/2019   LDLCALC 62 08/01/2019   TRIG 123 08/01/2019   CHOLHDL 2.6 08/01/2019  Significant Diagnostic Results in last 30  days:  No results found.  Assessment/Plan 1. Anxiety and depression Mood has improved with Paxil dose adjustment. - continue on Paxil 30 mg tablet daily  - Notify provider if depression symptoms worsen   2. ACP (advance care planning) ACP completed today in presence of patient and Granddaughter. she desires   Do not resuscitate order ( DNR), Comfort care with no further hospitalization.She would likeI.V fluids for defined trial period if indicated and determine use or limitation of antibiotic when infection occurs But no feeding tube.  3. Allergic rhinitis, unspecified seasonality, unspecified trigger - continue mucinex and Humidifier at bedtime  - follow up with ENT specialist.Reviewed goals of care and filled MOST form between 1:30 pm - 2: 00 Pm( 30 minutes) . Answered question/ concern from Granddaughter and patient to the best of my knowledge.  Family/ staff Communication: Reviewed plan of care with patient and Granddaughter.   Labs/tests ordered: None   Next Appointment: as needed.Has upcoming appointment 05/2020  Sandrea Hughs, NP

## 2020-01-09 ENCOUNTER — Ambulatory Visit: Payer: PPO | Attending: Internal Medicine

## 2020-01-09 DIAGNOSIS — Z23 Encounter for immunization: Secondary | ICD-10-CM | POA: Insufficient documentation

## 2020-01-09 NOTE — Progress Notes (Signed)
   Covid-19 Vaccination Clinic  Name:  JAICE STAIN    MRN: [id number] DOB: 02/01/1926  01/09/2020  Ms. Fantauzzi was observed post Covid-19 immunization for 15 minutes without incident. She was provided with Vaccine Information Sheet and instruction to access the V-Safe system.   Ms. Gilde was instructed to call 911 with any severe reactions post vaccine: Marland Kitchen Difficulty breathing  . Swelling of face and throat  . A fast heartbeat  . A bad rash all over body  . Dizziness and weakness   Immunizations Administered    Name Date Dose VIS Date Route   Pfizer COVID-19 Vaccine 01/09/2020  6:17 PM 0.3 mL 10/18/2019 Intramuscular   Manufacturer: [place]   Lot: [id number]   [place]: [id number]

## 2020-01-15 DIAGNOSIS — R0982 Postnasal drip: Secondary | ICD-10-CM | POA: Diagnosis not present

## 2020-02-03 MED ORDER — PAROXETINE HCL 20 MG PO TABS: 30.0000 mg | ORAL_TABLET | ORAL | 3 refills | Status: DC

## 2020-03-04 DIAGNOSIS — K117 Disturbances of salivary secretion: Secondary | ICD-10-CM | POA: Diagnosis not present

## 2020-03-04 DIAGNOSIS — Z79899 Other long term (current) drug therapy: Secondary | ICD-10-CM | POA: Diagnosis not present

## 2020-03-04 DIAGNOSIS — R0982 Postnasal drip: Secondary | ICD-10-CM | POA: Diagnosis not present

## 2020-03-04 DIAGNOSIS — Z885 Allergy status to narcotic agent status: Secondary | ICD-10-CM | POA: Diagnosis not present

## 2020-03-05 DIAGNOSIS — Z95810 Presence of automatic (implantable) cardiac defibrillator: Secondary | ICD-10-CM | POA: Diagnosis not present

## 2020-03-27 NOTE — Telephone Encounter (Signed)
[name],i recommend tapering Trazodone 50 mg tablet by reducing dose to 25 mg tablet one by mouth daily at bedtime for one week then take 25 mg tablet one by mouth every other day at bedtime x 1 week then stop. Start on melatonin 3 mg tablet one by mouth daily at bedtime.

## 2020-04-03 ENCOUNTER — Telehealth: Payer: Self-pay | Admitting: Family

## 2020-04-03 NOTE — Telephone Encounter (Signed)
Patient's granddaughter [name] called today.  She stated that she left a message on 03/26/20 but has not gotten a response.  She has questions regarding weaning her grandmother off the Trazadone.  Please give [name] a call today at [phone].  Thank you   Adan Sis

## 2020-04-03 NOTE — Telephone Encounter (Signed)
Tried Geophysical data processor. LMOM to Return call.           Ngetich, [name], NP routed this conversation to [employer], CMA  Ngetich, [name], NP     9:29 AM Note   Ms.Bedore,i recommend tapering Trazodone 50 mg tablet by reducing dose to 25 mg tablet one by mouth daily at bedtime for one week then take 25 mg tablet one by mouth every other day at bedtime x 1 week then stop. Start on melatonin 3 mg tablet one by mouth daily at bedtime

## 2020-04-03 NOTE — Telephone Encounter (Signed)
Stacy Notified and agreed.

## 2020-04-24 ENCOUNTER — Other Ambulatory Visit: Payer: Self-pay | Admitting: Family

## 2020-04-24 DIAGNOSIS — E785 Hyperlipidemia, unspecified: Secondary | ICD-10-CM

## 2020-05-08 ENCOUNTER — Other Ambulatory Visit: Payer: Self-pay

## 2020-05-08 ENCOUNTER — Ambulatory Visit (INDEPENDENT_AMBULATORY_CARE_PROVIDER_SITE_OTHER): Payer: PPO | Admitting: Family

## 2020-05-08 ENCOUNTER — Encounter: Payer: Self-pay | Admitting: Family

## 2020-05-08 ENCOUNTER — Ambulatory Visit
Admission: RE | Admit: 2020-05-08 | Discharge: 2020-05-08 | Disposition: A | Discharge status: Home / Self Care | Payer: PPO | Source: Ambulatory Visit | Attending: Family | Admitting: Family

## 2020-05-08 VITALS — BP 128/80 | HR 72 | Temp 97.3°F | Resp 16 | Ht 64.0 in | Wt 162.0 lb

## 2020-05-08 DIAGNOSIS — R058 Other specified cough: Secondary | ICD-10-CM

## 2020-05-08 DIAGNOSIS — R0602 Shortness of breath: Secondary | ICD-10-CM | POA: Diagnosis not present

## 2020-05-08 DIAGNOSIS — R05 Cough: Secondary | ICD-10-CM

## 2020-05-08 MED ORDER — DOXYCYCLINE HYCLATE 100 MG PO TABS: 100.0000 mg | ORAL_TABLET | Freq: Two times a day (BID) | ORAL | 0 refills | Status: AC

## 2020-05-08 NOTE — Progress Notes (Signed)
Provider: [name] FNP-C  Ajia Chadderdon, [name], NP  Patient Care Team: Quita Mcgrory, [name], NP as PCP - General (Family Medicine) [name], MD as Consulting Physician (Dermatology) [name], MD as Consulting Physician (Otolaryngology) [name], MD as Referring Physician (Cardiology)  Extended Emergency Contact Information Primary Emergency Contact: [name] [place] of [place] Phone: [phone] Work Phone: [phone] Relation: Daughter Secondary Emergency Contact: [name] States of [place] Phone: [phone] Mobile Phone: [phone] Relation: Granddaughter  Code Status:  DNR Goals of care: Advanced Directive information Advanced Directives 05/08/2020  Does Patient Have a Medical Advance Directive? Yes  Type of Advance Directive Out of facility DNR (pink MOST or yellow form)  Does patient want to make changes to medical advance directive? No - Patient declined  Copy of [place] in Chart? -  Would patient like information on creating a medical advance directive? -     Chief Complaint  Patient presents with  . Acute Visit    URI x 4 days.    HPI:  Pt is a 86 y.o. female seen today for an acute visit for evaluation of cough x 4 days.started on monday with nasal congestion and drainage.Has taken Loratadine per pharmacy recommendation.Yesterday had a cough spell with left arm numbness so daughter called EMS but didn;t think was cardiac.EMS heard rattling.Has had shortness of breath and wheezing.she denies any fever or chills.Also feels weak.Appetite has been good.Drinks fluid too.   Past Medical History:  Diagnosis Date  . Acid reflux   . Arthritis   . Atrial fibrillation ([place])   . Breast cancer [facility]) 2011   Left Breast Cancer  . Cancer [facility]) 2011   Left Breast Cancer  . Depression   . Hyperlipidemia   . Hypertension   . Insomnia   . Osteoporosis   . Pacemaker 2010   [place]  . Pelvic  fracture ([place]) 2010  . Thyroid disease    Past Surgical History:  Procedure Laterality Date  . ABDOMINAL HYSTERECTOMY  35 yrs ago  . BREAST LUMPECTOMY Left 05/23/10  . CARDIAC DEFIBRILLATOR PLACEMENT  2 years ago  . CHOLECYSTECTOMY  50 YEARS AGO  . ELBOW SURGERY  2006    Allergies  Allergen Reactions  . Codeine Nausea Only    Outpatient Encounter Medications as of 05/08/2020  Medication Sig  . acetaminophen (MAPAP ARTHRITIS PAIN) 650 MG CR tablet Take 650 mg by mouth every 8 (eight) hours as needed for pain.  . Calcium Carb-Cholecalciferol (CALCIUM+D3) 600-800 MG-UNIT TABS Take 1 tablet by mouth every morning.  . cholecalciferol (VITAMIN D3) 25 MCG (1000 UT) tablet Take 1,000 Units by mouth every morning.   . gabapentin (NEURONTIN) 600 MG tablet Take 300 mg by mouth 2 (two) times daily as needed.  Marland Kitchen guaiFENesin (MUCINEX) 600 MG 12 hr tablet Take 600 mg by mouth 2 (two) times daily.  Marland Kitchen ketoconazole (NIZORAL) 2 % cream Apply 1 application topically 2 (two) times daily as needed (for 2 weeks as needed for flares).  Marland Kitchen levothyroxine (SYNTHROID) 75 MCG tablet Take 75 mcg by mouth daily before breakfast. Patient can not take generic synthroid  . loratadine (CLARITIN) 10 MG tablet Take 10 mg by mouth daily.  . Melatonin 5 MG CHEW Chew 5 mg by mouth at bedtime.  . metoprolol succinate (TOPROL-XL) 25 MG 24 hr tablet Take 25 mg by mouth daily.   . Multiple Vitamins-Minerals (ABC PLUS PO) Take 1 tablet by mouth daily.   Marland Kitchen  Omega-3 1000 MG CAPS Take 1 capsule by mouth daily.  . pantoprazole (PROTONIX) 40 MG tablet TAKE 1 TABLET BY MOUTH ONCE DAILY  . PARoxetine (PAXIL) 20 MG tablet Take 1.5 tablets (30 mg total) by mouth every morning.  . potassium chloride (K-DUR) 10 MEQ tablet Take 10 mEq by mouth every morning.   Marland Kitchen PRADAXA 75 MG CAPS capsule Take 75 mg by mouth 2 (two) times daily.   . pravastatin (PRAVACHOL) 10 MG tablet TAKE 1 TABLET BY MOUTH DAILY.  . Probiotic Product (ALIGN) 4 MG CAPS Take  1 capsule (4 mg total) by mouth daily.  . [DISCONTINUED] cetirizine (ZYRTEC) 5 MG tablet TAKE 1 TABLET BY MOUTH AT BEDTIME. (Patient not taking: Reported on 01/08/2020)  . [DISCONTINUED] traZODone (DESYREL) 50 MG tablet Take 1 tablet (50 mg total) by mouth at bedtime.   No facility-administered encounter medications on file as of 05/08/2020.    Review of Systems  Constitutional: Negative for appetite change, chills, fatigue and fever.  HENT: Negative for congestion, postnasal drip, rhinorrhea, sinus pressure, sinus pain, sneezing, sore throat and trouble swallowing.   Eyes: Negative for discharge, redness and itching.  Respiratory: Positive for cough. Negative for chest tightness, shortness of breath and wheezing.   Cardiovascular: Negative for chest pain, palpitations and leg swelling.  Gastrointestinal: Negative for abdominal distention, abdominal pain, constipation, diarrhea, nausea and vomiting.  Musculoskeletal: Positive for gait problem. Negative for arthralgias.  Skin: Negative for color change, pallor and rash.  Neurological: Negative for dizziness, speech difficulty, light-headedness, numbness and headaches.  Hematological: Does not bruise/bleed easily.  Psychiatric/Behavioral: Negative for agitation, confusion and sleep disturbance. The patient is not nervous/anxious.     Immunization History  Administered Date(s) Administered  . Fluad Quad(high Dose 65+) 08/01/2019  . Influenza, High Dose Seasonal PF 07/31/2018  . Influenza-Unspecified 06/12/2015  . PFIZER SARS-COV-2 Vaccination 12/16/2019, 01/09/2020  . Pneumococcal Conjugate-13 12/23/2016  . Pneumococcal Polysaccharide-23 10/12/2003  . Tdap 12/25/2017   Pertinent  Health Maintenance Due  Topic Date Due  . INFLUENZA VACCINE  06/07/2020  . DEXA SCAN  Completed  . PNA vac Low Risk Adult  Completed   Fall Risk  05/08/2020 01/08/2020 12/11/2019 12/02/2019 11/06/2019  Falls in the past year? 0 0 0 0 0  Number falls in past yr: 0 0  0 0 0  Injury with Fall? 0 - 0 0 0    Vitals:   05/08/20 1434  BP: 128/80  Pulse: 72  Resp: 16  Temp: (!) 97.3 F (36.3 C)  SpO2: 96%  Weight: 162 lb (73.5 kg)  Height: 5\' 4"  (1.626 m)   Body mass index is 27.81 kg/m. Physical Exam Vitals reviewed.  Constitutional:      General: She is not in acute distress.    Appearance: She is overweight. She is not ill-appearing.  HENT:     Head: Normocephalic.     Mouth/Throat:     Mouth: Mucous membranes are moist.     Pharynx: Oropharynx is clear. No oropharyngeal exudate or posterior oropharyngeal erythema.  Eyes:     General: No scleral icterus.       Right eye: No discharge.        Left eye: No discharge.     Extraocular Movements: Extraocular movements intact.     Conjunctiva/sclera: Conjunctivae normal.     Pupils: Pupils are equal, round, and reactive to light.  Cardiovascular:     Rate and Rhythm: Normal rate and regular rhythm.  Pulses: Normal pulses.     Heart sounds: Normal heart sounds. No murmur heard.  No friction rub. No gallop.   Pulmonary:     Breath sounds: Examination of the right-upper field reveals rhonchi. Examination of the left-middle field reveals rales. Examination of the right-lower field reveals rales. Examination of the left-lower field reveals rales. Rhonchi and rales present.  Abdominal:     General: Bowel sounds are normal. There is no distension.     Palpations: Abdomen is soft. There is no mass.     Tenderness: There is no abdominal tenderness. There is no right CVA tenderness, left CVA tenderness, guarding or rebound.  Neurological:     Mental Status: She is alert and oriented to person, place, and time.  Psychiatric:        Mood and Affect: Mood normal.        Behavior: Behavior normal.        Thought Content: Thought content normal.        Judgment: Judgment normal.    Labs reviewed: Recent Labs    08/01/19 1507  NA 140  K 4.7  CL 105  CO2 27  GLUCOSE 107*  BUN 11   CREATININE 0.76  CALCIUM 9.9   Recent Labs    08/01/19 1507  AST 24  ALT 12  BILITOT 0.6  PROT 6.3   Recent Labs    08/01/19 1507 11/06/19 1551  WBC 6.8 9.1  NEUTROABS 4,468 6,406  HGB 13.4 13.7  HCT 40.8 41.6  MCV 91.5 91.0  PLT 247 239   Lab Results  Component Value Date   TSH 1.49 08/01/2019   Lab Results  Component Value Date   HGBA1C 6.1 (H) 12/12/2018   Lab Results  Component Value Date   CHOL 133 08/01/2019   HDL 51 08/01/2019   LDLCALC 62 08/01/2019   TRIG 123 08/01/2019   CHOLHDL 2.6 08/01/2019    Significant Diagnostic Results in last 30 days:  No results found.  Assessment/Plan  Non-productive cough Afebrile.Bilateral lung Rhonchi and rales noted.No shortness of breath or wheezing noted.will treat clinically with doxycycline.on probiotics already.will obtain chest X-ray to rule out other acute abnormalities.  - doxycycline (VIBRA-TABS) 100 MG tablet; Take 1 tablet (100 mg total) by mouth 2 (two) times daily for 7 days.  Dispense: 14 tablet; Refill: 0 - Advised to notify provider if cough worsen or fail to improved or any worsening shortness of breath or wheezing.  - DG Chest 2 View; Future  Family/ staff Communication: Reviewed plan of care with patient verbalized understanding.   Labs/tests ordered:  - DG Chest 2 View; Future  Next Appointment: As needed if symptoms worsen or fail to improve.   Sandrea Hughs, NP

## 2020-05-08 NOTE — Patient Instructions (Signed)
-   Notify provider if cough worsen or fail improved or any worsening shortness of breath or wheezing.

## 2020-05-12 NOTE — Telephone Encounter (Signed)
[name] is out of office today, I am sending message to covering provider

## 2020-05-19 ENCOUNTER — Other Ambulatory Visit: Payer: Self-pay | Admitting: Family

## 2020-06-02 ENCOUNTER — Other Ambulatory Visit: Payer: PPO

## 2020-06-02 ENCOUNTER — Other Ambulatory Visit: Payer: Self-pay

## 2020-06-02 DIAGNOSIS — I10 Essential (primary) hypertension: Secondary | ICD-10-CM

## 2020-06-02 DIAGNOSIS — E785 Hyperlipidemia, unspecified: Secondary | ICD-10-CM

## 2020-06-02 DIAGNOSIS — E039 Hypothyroidism, unspecified: Secondary | ICD-10-CM | POA: Diagnosis not present

## 2020-06-02 DIAGNOSIS — R739 Hyperglycemia, unspecified: Secondary | ICD-10-CM | POA: Diagnosis not present

## 2020-06-03 DIAGNOSIS — Z4502 Encounter for adjustment and management of automatic implantable cardiac defibrillator: Secondary | ICD-10-CM | POA: Diagnosis not present

## 2020-06-03 DIAGNOSIS — Z95810 Presence of automatic (implantable) cardiac defibrillator: Secondary | ICD-10-CM | POA: Diagnosis not present

## 2020-06-03 DIAGNOSIS — I4821 Permanent atrial fibrillation: Secondary | ICD-10-CM | POA: Diagnosis not present

## 2020-06-03 DIAGNOSIS — I472 Ventricular tachycardia: Secondary | ICD-10-CM | POA: Diagnosis not present

## 2020-06-03 DIAGNOSIS — I10 Essential (primary) hypertension: Secondary | ICD-10-CM | POA: Diagnosis not present

## 2020-06-04 LAB — COMPLETE METABOLIC PANEL WITH GFR
AG Ratio: 1.7 (calc) (ref 1.0–2.5)
ALT: 15 U/L (ref 6–29)
AST: 21 U/L (ref 10–35)
Albumin: 4.1 g/dL (ref 3.6–5.1)
Alkaline phosphatase (APISO): 44 U/L (ref 37–153)
BUN: 16 mg/dL (ref 7–25)
CO2: 28 mmol/L (ref 20–32)
Calcium: 9.7 mg/dL (ref 8.6–10.4)
Chloride: 106 mmol/L (ref 98–110)
Creat: 0.73 mg/dL (ref 0.60–0.88)
GFR, Est African American: 83 mL/min/{1.73_m2} (ref >=60)
GFR, Est Non African American: 72 mL/min/{1.73_m2} (ref >=60)
Globulin: 2.4 g/dL (calc) (ref 1.9–3.7)
Glucose, Bld: 109 mg/dL — ABNORMAL HIGH (ref 65–99)
Potassium: 4.6 mmol/L (ref 3.5–5.3)
Sodium: 141 mmol/L (ref 135–146)
Total Bilirubin: 0.6 mg/dL (ref 0.2–1.2)
Total Protein: 6.5 g/dL (ref 6.1–8.1)

## 2020-06-04 LAB — CBC WITH DIFFERENTIAL/PLATELET
Absolute Monocytes: 548 cells/uL (ref 200–950)
Basophils Absolute: 38 cells/uL (ref 0–200)
Basophils Relative: 0.6 %
Eosinophils Absolute: 309 cells/uL (ref 15–500)
Eosinophils Relative: 4.9 %
HCT: 41.4 % (ref 35.0–45.0)
Hemoglobin: 13.4 g/dL (ref 11.7–15.5)
Lymphs Abs: 2066 cells/uL (ref 850–3900)
MCH: 30 pg (ref 27.0–33.0)
MCHC: 32.4 g/dL (ref 32.0–36.0)
MCV: 92.8 fL (ref 80.0–100.0)
MPV: 10.5 fL (ref 7.5–12.5)
Monocytes Relative: 8.7 %
Neutro Abs: 3339 cells/uL (ref 1500–7800)
Neutrophils Relative %: 53 %
Platelets: 260 10*3/uL (ref 140–400)
RBC: 4.46 10*6/uL (ref 3.80–5.10)
RDW: 13 % (ref 11.0–15.0)
Total Lymphocyte: 32.8 %
WBC: 6.3 10*3/uL (ref 3.8–10.8)

## 2020-06-04 LAB — LIPID PANEL
Cholesterol: 169 mg/dL (ref <200)
HDL: 51 mg/dL (ref >=50)
LDL Cholesterol (Calc): 94 mg/dL (calc)
Non-HDL Cholesterol (Calc): 118 mg/dL (calc) (ref <130)
Total CHOL/HDL Ratio: 3.3 (calc) (ref <5.0)
Triglycerides: 140 mg/dL (ref <150)

## 2020-06-04 LAB — HEMOGLOBIN A1C
Hgb A1c MFr Bld: 5.7 % of total Hgb — ABNORMAL HIGH (ref <5.7)
Mean Plasma Glucose: 117 (calc)
eAG (mmol/L): 6.5 (calc)

## 2020-06-04 LAB — TSH: TSH: 2.57 mIU/L (ref 0.40–4.50)

## 2020-06-04 LAB — TEST AUTHORIZATION

## 2020-06-05 ENCOUNTER — Ambulatory Visit: Payer: PPO | Admitting: Family

## 2020-06-09 ENCOUNTER — Other Ambulatory Visit: Payer: Self-pay

## 2020-06-09 ENCOUNTER — Ambulatory Visit (INDEPENDENT_AMBULATORY_CARE_PROVIDER_SITE_OTHER): Payer: PPO | Admitting: Family

## 2020-06-09 ENCOUNTER — Encounter: Payer: Self-pay | Admitting: Family

## 2020-06-09 VITALS — BP 118/80 | HR 72 | Temp 96.9°F | Resp 16 | Ht 64.0 in | Wt 163.8 lb

## 2020-06-09 DIAGNOSIS — F411 Generalized anxiety disorder: Secondary | ICD-10-CM | POA: Diagnosis not present

## 2020-06-09 DIAGNOSIS — E039 Hypothyroidism, unspecified: Secondary | ICD-10-CM

## 2020-06-09 DIAGNOSIS — E785 Hyperlipidemia, unspecified: Secondary | ICD-10-CM | POA: Diagnosis not present

## 2020-06-09 DIAGNOSIS — R7303 Prediabetes: Secondary | ICD-10-CM

## 2020-06-09 DIAGNOSIS — H6121 Impacted cerumen, right ear: Secondary | ICD-10-CM

## 2020-06-09 DIAGNOSIS — I10 Essential (primary) hypertension: Secondary | ICD-10-CM | POA: Diagnosis not present

## 2020-06-09 NOTE — Progress Notes (Signed)
Provider: [name] FNP-C   Keivon Garden, [name], NP  Patient Care Team: Avacyn Kloosterman, [name], NP as PCP - General (Family Medicine) [name], MD as Consulting Physician (Dermatology) [name], MD as Consulting Physician (Otolaryngology) [name], MD as Referring Physician (Cardiology)  Extended Emergency Contact Information Primary Emergency Contact: [name] [place] of [place] Phone: [phone] Work Phone: [phone] Relation: Daughter Secondary Emergency Contact: [name] [place] of [place] Phone: [phone] Mobile Phone: [phone] Relation: Granddaughter  Code Status: DNR  Goals of care: Advanced Directive information Advanced Directives 06/09/2020  Does Patient Have a Medical Advance Directive? Yes  Type of Advance Directive Out of facility DNR (pink MOST or yellow form)  Does patient want to make changes to medical advance directive? No - Patient declined  Copy of [place] in Chart? -  Would patient like information on creating a medical advance directive? -     Chief Complaint  Patient presents with   Medical Management of Chronic Issues    6 Month Follow Up.   Immunizations    Influenza Vaccine.    HPI:  Pt is a 86 y.o. female seen today for 6 months follow for medical management of chronic diseases.she denies any acute issues today.Her recent lab results done 06/02/2020 discussed with patient.CBC shows no infection or anemia.Her electrolytes,kidney and liver function were within normal range.  Hypothyroidism - TSH level 2.57 within normal range.denies any symptoms of hypo/hyperthyroidism.  Prediabetes - serum Glucose 109, Hgb A1C 5.7 improved from previous 6.2 .Has stopped cranberry gummies and prune juice which had lots of sugar.will find some which are low in sugar.   Hypertension  - No home B/p log for review.she denies any signs of hypo/hypertension.on Metoprolol succinate 25 mg tablet  daily   Generalized anxiety - states stable on Paxil 20 mg tablet daily     Hyperlipidemia - total cholesterol 169,TRG 140,LDL 94 all within normal range though slightly higher compared to previous lab work. Dietary modification and exercise discussed.on Omega - 3 1000 mg capsule daily and Pravachol 10 mg tablet daily. - discussed influenza vaccine when available at [facility] office.will schedule a Nurse visit for vaccine.  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     Past Medical History:  Diagnosis Date   Acid reflux    Arthritis    Atrial fibrillation ([place])    Breast cancer ([place]) 2011   Left Breast Cancer   Cancer [facility]) 2011   Left Breast Cancer   Depression    Hyperlipidemia    Hypertension    Insomnia    Osteoporosis    Pacemaker 2010   WIITH DEFIB   Pelvic fracture ([place]) 2010   Thyroid disease    Past Surgical History:  Procedure Laterality Date   ABDOMINAL HYSTERECTOMY  35 yrs ago   BREAST LUMPECTOMY Left 05/23/10   CARDIAC DEFIBRILLATOR PLACEMENT  2 years ago   CHOLECYSTECTOMY  50 YEARS AGO   ELBOW SURGERY  2006    Allergies  Allergen Reactions   Codeine Nausea Only    Allergies as of 06/09/2020      Reactions   Codeine Nausea Only      Medication List       Accurate as of June 09, 2020  3:46 PM. If you have any questions, ask your nurse or doctor.        ABC PLUS PO Take 1 tablet by mouth daily.   Align 4 MG Caps Take 1  capsule (4 mg total) by mouth daily.   Calcium+D3 600-800 MG-UNIT Tabs Generic drug: Calcium Carb-Cholecalciferol Take 1 tablet by mouth every morning.   cholecalciferol 25 MCG (1000 UNIT) tablet Commonly known as: VITAMIN D3 Take 1,000 Units by mouth every morning.   gabapentin 600 MG tablet Commonly known as: NEURONTIN Take 300 mg by mouth 2 (two) times daily. At bedtime.   guaiFENesin 600 MG 12 hr tablet Commonly known as: MUCINEX Take 600 mg by mouth 2 (two) times daily.   ketoconazole 2 %  cream Commonly known as: NIZORAL Apply 1 application topically 2 (two) times daily as needed (for 2 weeks as needed for flares).   levothyroxine 75 MCG tablet Commonly known as: SYNTHROID Take 75 mcg by mouth daily before breakfast. Patient can not take generic synthroid   loratadine 10 MG tablet Commonly known as: CLARITIN Take 10 mg by mouth daily.   Mapap Arthritis Pain 650 MG CR tablet Generic drug: acetaminophen Take 650 mg by mouth every 8 (eight) hours as needed for pain.   Melatonin 5 MG Chew Chew 5 mg by mouth at bedtime.   metoprolol succinate 25 MG 24 hr tablet Commonly known as: TOPROL-XL Take 25 mg by mouth daily.   Omega-3 1000 MG Caps Take 1 capsule by mouth daily.   pantoprazole 40 MG tablet Commonly known as: PROTONIX TAKE 1 TABLET BY MOUTH ONCE DAILY   PARoxetine 20 MG tablet Commonly known as: PAXIL Take 20 mg by mouth daily. At 6pm What changed: Another medication with the same name was removed. Continue taking this medication, and follow the directions you see here. Changed by: Sandrea Hughs, NP   potassium chloride 10 MEQ tablet Commonly known as: KLOR-CON Take 10 mEq by mouth every morning.   Pradaxa 75 MG Caps capsule Generic drug: dabigatran Take 75 mg by mouth 2 (two) times daily.   pravastatin 10 MG tablet Commonly known as: PRAVACHOL TAKE 1 TABLET BY MOUTH DAILY.       Review of Systems  Constitutional: Negative for appetite change, chills, fatigue and fever.  Eyes: Positive for visual disturbance. Negative for pain, discharge and redness.       Wears eye glasses has had glaze after cataract surgery follows up with ophthalmology   Respiratory: Negative for cough, chest tightness, shortness of breath and wheezing.   Cardiovascular: Negative for chest pain, palpitations and leg swelling.  Gastrointestinal: Negative for abdominal distention, abdominal pain, constipation, diarrhea, nausea and vomiting.  Endocrine: Negative for cold  intolerance, heat intolerance, polydipsia, polyphagia and polyuria.  Genitourinary: Negative for difficulty urinating, dysuria, flank pain, frequency and urgency.       Wears incontinent pads   Musculoskeletal: Positive for gait problem. Negative for joint swelling and myalgias.  Skin: Negative for color change, pallor and rash.  Neurological: Negative for dizziness, speech difficulty, weakness, light-headedness, numbness and headaches.  Hematological: Does not bruise/bleed easily.  Psychiatric/Behavioral: Negative for agitation, confusion and sleep disturbance. The patient is not nervous/anxious.     Immunization History  Administered Date(s) Administered   Fluad Quad(high Dose 65+) 08/01/2019   Influenza, High Dose Seasonal PF 07/31/2018   Influenza-Unspecified 06/12/2015   PFIZER SARS-COV-2 Vaccination 12/16/2019, 01/09/2020   Pneumococcal Conjugate-13 12/23/2016   Pneumococcal Polysaccharide-23 10/12/2003   Tdap 12/25/2017   Pertinent  Health Maintenance Due  Topic Date Due   INFLUENZA VACCINE  06/07/2020   DEXA SCAN  Completed   PNA vac Low Risk Adult  Completed   Fall Risk  06/09/2020 05/08/2020 01/08/2020 12/11/2019 12/02/2019  Falls in the past year? 0 0 0 0 0  Number falls in past yr: 0 0 0 0 0  Injury with Fall? 0 0 - 0 0    Vitals:   06/09/20 1507  BP: 118/80  Pulse: 72  Resp: 16  Temp: (!) 96.9 F (36.1 C)  SpO2: 96%  Weight: 163 lb 12.8 oz (74.3 kg)  Height: _0  (1.626 m)   Body mass index is 28.12 kg/m. Physical Exam Vitals reviewed.  Constitutional:      General: She is not in acute distress.    Appearance: She is overweight. She is not ill-appearing.  HENT:     Head: Normocephalic.     Right Ear: There is impacted cerumen.     Left Ear: Tympanic membrane, ear canal and external ear normal. There is no impacted cerumen.     Nose: Nose normal. No congestion or rhinorrhea.     Mouth/Throat:     Mouth: Mucous membranes are moist.     Pharynx:  Oropharynx is clear. No oropharyngeal exudate or posterior oropharyngeal erythema.  Eyes:     General: No scleral icterus.       Right eye: No discharge.        Left eye: No discharge.     Extraocular Movements: Extraocular movements intact.     Conjunctiva/sclera: Conjunctivae normal.     Pupils: Pupils are equal, round, and reactive to light.  Neck:     Vascular: No carotid bruit.  Cardiovascular:     Rate and Rhythm: Normal rate and regular rhythm.     Pulses: Normal pulses.     Heart sounds: Normal heart sounds. No murmur heard.  No friction rub. No gallop.   Pulmonary:     Effort: Pulmonary effort is normal. No respiratory distress.     Breath sounds: Normal breath sounds. No wheezing, rhonchi or rales.  Chest:     Chest wall: No tenderness.  Abdominal:     General: Bowel sounds are normal. There is no distension.     Palpations: Abdomen is soft. There is no mass.     Tenderness: There is no abdominal tenderness. There is no right CVA tenderness, left CVA tenderness, guarding or rebound.  Musculoskeletal:        General: No swelling or tenderness.     Cervical back: Normal range of motion. No rigidity or tenderness.     Right lower leg: No edema.     Left lower leg: No edema.     Comments: Unsteady gait walks with a cane   Lymphadenopathy:     Cervical: No cervical adenopathy.  Skin:    General: Skin is warm.     Coloration: Skin is not pale.     Findings: No bruising, erythema or rash.  Neurological:     Mental Status: She is alert and oriented to person, place, and time.     Cranial Nerves: No cranial nerve deficit.     Sensory: No sensory deficit.     Motor: No weakness.     Coordination: Coordination normal.     Gait: Gait normal.  Psychiatric:        Mood and Affect: Mood normal.        Behavior: Behavior normal.        Thought Content: Thought content normal.        Judgment: Judgment normal.    Labs reviewed: Recent Labs    08/01/19 1507  06/02/20 0857   NA 140 141  K 4.7 4.6  CL 105 106  CO2 27 28  GLUCOSE 107* 109*  BUN 11 16  CREATININE 0.76 0.73  CALCIUM 9.9 9.7   Recent Labs    08/01/19 1507 06/02/20 0857  AST 24 21  ALT 12 15  BILITOT 0.6 0.6  PROT 6.3 6.5   Recent Labs    08/01/19 1507 11/06/19 1551 06/02/20 0857  WBC 6.8 9.1 6.3  NEUTROABS 4,468 6,406 3,339  HGB 13.4 13.7 13.4  HCT 40.8 41.6 41.4  MCV 91.5 91.0 92.8  PLT 247 239 260   Lab Results  Component Value Date   TSH 2.57 06/02/2020   Lab Results  Component Value Date   HGBA1C 5.7 (H) 06/02/2020   Lab Results  Component Value Date   CHOL 169 06/02/2020   HDL 51 06/02/2020   LDLCALC 94 06/02/2020   TRIG 140 06/02/2020   CHOLHDL 3.3 06/02/2020    Significant Diagnostic Results in last 30 days:  No results found.  Assessment/Plan 1. Essential hypertension B/p well controlled.continue on on Metoprolol succinate 25 mg tablet daily On Pravachol for cardiovascular event prevention.  - CBC with Differential/Platelet; Future - CMP with eGFR([facility]); Future  2. Hyperlipidemia, unspecified hyperlipidemia type LDL at goal though slightly higher than previous labs. - dietary modification and exercise advised. - continue on Omega - 3 1000 mg capsule daily and Pravachol 10 mg tablet daily.  - Lipid panel; Future  3. Hypothyroidism, unspecified type Lab Results  Component Value Date   TSH 2.57 06/02/2020  - continue on levothyroxine 75 mcg tablet daily.  - TSH; Future  4. Generalized anxiety disorder Stable.continue on Paxil 20 mg tablet daily.   5. Right ear impacted cerumen TM not visualized will follow up with ENT  6. Prediabetes Lab Results  Component Value Date   HGBA1C 5.7 (H) 06/02/2020  Has improved from previous labs. - dietary modification and exercise advised.Has stopped sugar cranberry gummies and prune juice.will replace with low sugar.   - Hemoglobin A1c; Future - continue on Statin and BB  Family/ staff Communication:  Reviewed plan of care with patient and daughter verbalized understanding.   Labs/tests ordered:  - CBC with Differential/Platelet; Future - CMP with eGFR([facility]); Future - Lipid panel; Future - Hemoglobin A1c; Future - TSH; Future  Next Appointment : 6 months for medical management of chronic issues labs 2-4 days prior to visit.Also advised to schedule Nurse visit appointment for Influenza vaccine.   Sandrea Hughs, NP

## 2020-06-27 ENCOUNTER — Other Ambulatory Visit: Payer: Self-pay | Admitting: Family

## 2020-07-07 ENCOUNTER — Other Ambulatory Visit: Payer: Self-pay

## 2020-07-07 ENCOUNTER — Ambulatory Visit (INDEPENDENT_AMBULATORY_CARE_PROVIDER_SITE_OTHER): Payer: PPO | Admitting: Family

## 2020-07-07 ENCOUNTER — Telehealth: Payer: Self-pay

## 2020-07-07 ENCOUNTER — Encounter: Payer: Self-pay | Admitting: Family

## 2020-07-07 DIAGNOSIS — Z0000 Encounter for general adult medical examination without abnormal findings: Secondary | ICD-10-CM

## 2020-07-07 NOTE — Progress Notes (Signed)
Subjective:    [name] is a 86 y.o. female who presents for a Welcome to Medicare exam.  She denies any acute issues today.  Review of Systems  Cardiac Risk Factors include: advanced age (>102men, >52 women);hypertension      Objective:    Today's Vitals   07/07/20 1318  PainSc: 10-Worst pain ever  There is no height or weight on file to calculate BMI.  Medications Outpatient Encounter Medications as of 07/07/2020  Medication Sig   acetaminophen (MAPAP ARTHRITIS PAIN) 650 MG CR tablet Take 650 mg by mouth every 8 (eight) hours as needed for pain.   Calcium Carb-Cholecalciferol (CALCIUM+D3) 600-800 MG-UNIT TABS Take 1 tablet by mouth every morning.   cholecalciferol (VITAMIN D3) 25 MCG (1000 UT) tablet Take 1,000 Units by mouth every morning.    gabapentin (NEURONTIN) 600 MG tablet TAKE 1 TABLET BY MOUTH TWICE A DAY AS NEEDED   guaiFENesin (MUCINEX) 600 MG 12 hr tablet Take 600 mg by mouth 2 (two) times daily.   ketoconazole (NIZORAL) 2 % cream Apply 1 application topically 2 (two) times daily as needed (for 2 weeks as needed for flares).   levothyroxine (SYNTHROID) 75 MCG tablet Take 75 mcg by mouth daily before breakfast. Patient can not take generic synthroid   loratadine (CLARITIN) 10 MG tablet Take 10 mg by mouth daily.   Melatonin 5 MG CHEW Chew 5 mg by mouth at bedtime.   metoprolol succinate (TOPROL-XL) 25 MG 24 hr tablet Take 25 mg by mouth daily.    Multiple Vitamins-Minerals (ABC PLUS PO) Take 1 tablet by mouth daily.    Omega-3 1000 MG CAPS Take 1 capsule by mouth daily.   pantoprazole (PROTONIX) 40 MG tablet TAKE 1 TABLET BY MOUTH ONCE DAILY   PARoxetine (PAXIL) 30 MG tablet Take 30 mg by mouth daily.   potassium chloride (K-DUR) 10 MEQ tablet Take 10 mEq by mouth every morning.    PRADAXA 75 MG CAPS capsule Take 75 mg by mouth 2 (two) times daily.    pravastatin (PRAVACHOL) 10 MG tablet TAKE 1 TABLET BY MOUTH DAILY.   Probiotic Product  (ALIGN) 4 MG CAPS Take 1 capsule (4 mg total) by mouth daily.   [DISCONTINUED] PARoxetine (PAXIL) 20 MG tablet Take 20 mg by mouth daily. At 6pm   No facility-administered encounter medications on file as of 07/07/2020.     History: Past Medical History:  Diagnosis Date   Acid reflux    Arthritis    Atrial fibrillation ([place])    Breast cancer ([place]) 2011   Left Breast Cancer   Cancer [facility]) 2011   Left Breast Cancer   Depression    Hyperlipidemia    Hypertension    Insomnia    Osteoporosis    Pacemaker 2010   WIITH DEFIB   Pelvic fracture ([place]) 2010   Thyroid disease    Past Surgical History:  Procedure Laterality Date   ABDOMINAL HYSTERECTOMY  35 yrs ago   BREAST LUMPECTOMY Left 05/23/10   CARDIAC DEFIBRILLATOR PLACEMENT  2 years ago   CHOLECYSTECTOMY  69 YEARS AGO   ELBOW SURGERY  2006    Family History  Problem Relation Age of Onset   Heart disease Mother    Diabetes Father    Stroke Father    Cancer Sister        lung   Cancer Brother        liver   COPD Brother    Heart attack Brother  Stroke Brother    Heart attack Brother    Social History   Occupational History   Not on file  Tobacco Use   Smoking status: Never Smoker   Smokeless tobacco: Never Used  Scientific laboratory technician Use: Never used  Substance and Sexual Activity   Alcohol use: No   Drug use: Never   Sexual activity: Not on file    Tobacco Counseling Counseling given: Not Answered   Immunizations and Health Maintenance Immunization History  Administered Date(s) Administered   Fluad Quad(high Dose 65+) 08/01/2019   Influenza, High Dose Seasonal PF 07/31/2018   Influenza-Unspecified 06/12/2015   PFIZER SARS-COV-2 Vaccination 12/16/2019, 01/09/2020   Pneumococcal Conjugate-13 12/23/2016   Pneumococcal Polysaccharide-23 10/12/2003   Tdap 12/25/2017   Health Maintenance Due  Topic Date Due   INFLUENZA VACCINE  06/07/2020    Activities of  Daily Living In your present state of health, do you have any difficulty performing the following activities: 07/07/2020  Hearing? Y  Vision? N  Difficulty concentrating or making decisions? Y  Comment Remembering  Walking or climbing stairs? Y  Comment uses a walker  Dressing or bathing? N  Doing errands, shopping? Y  Comment [place] and eating ? Y  Comment daughter assist  Using the Toilet? N  In the past six months, have you accidently leaked urine? Y  Comment wears pads  Do you have problems with loss of bowel control? N  Managing your Medications? Y  Comment daughter assist  Managing your Finances? N  Housekeeping or managing your Housekeeping? Y  Comment family assist  Some recent data might be hidden    Physical Exam: This visit done over the phone unable to complete physical exam. or other factors deemed appropriate based on the beneficiary's medical and social history and current clinical standards.  Advanced Directives: Does Patient Have a Medical Advance Directive?: Yes Type of Advance Directive: Out of facility DNR (pink MOST or yellow form) Does patient want to make changes to medical advance directive?: No - Patient declined    Assessment:    This is a routine wellness examination for this patient.  Vision/Hearing screen No exam data present  Dietary issues and exercise activities discussed:  Current Exercise Habits: Home exercise routine, Type of exercise: walking, Time (Minutes): 10, Frequency (Times/Week): 3, Weekly Exercise (Minutes/Week): 30, Intensity: Mild, Exercise limited by: Other - see comments (back and leg pain)  Goals     Patient Stated     Thankful for the state of health       Depression Screen PHQ 2/9 Scores 07/07/2020 12/11/2019 12/02/2019  PHQ - 2 Score - 0 0  Exception Documentation Other- indicate reason in comment box - -     Fall Risk Fall Risk  07/07/2020  Falls in the past year? 0  Number falls in past  yr: 0  Injury with Fall? 0    Cognitive Function:  Patient Care Team: Charleen Madera, [name], NP as PCP - General (Family Medicine) [name], MD as Consulting Physician (Dermatology) [name], MD as Consulting Physician (Otolaryngology) [name], MD as Referring Physician (Cardiology)     Plan:  - Booster shot for COVID-19 and Influenza vaccine when available.  I have personally reviewed and noted the following in the patients chart:    Medical and social history  Use of alcohol, tobacco or illicit drugs   Current medications and supplements  Functional ability and status  Nutritional status  Physical  activity  Advanced directives  List of other physicians  Hospitalizations, surgeries, and ER visits in previous 12 months  Vitals  Screenings to include cognitive, depression, and falls  Referrals and appointments  In addition, I have reviewed and discussed with patient certain preventive protocols, quality metrics, and best practice recommendations. A written personalized care plan for preventive services as well as general preventive health recommendations were provided to patient.    Sandrea Hughs, NP 07/07/2020

## 2020-07-07 NOTE — Patient Instructions (Signed)
[name] , Thank you for taking time to come for your Medicare Wellness Visit. I appreciate your ongoing commitment to your health goals. Please review the following plan we discussed and let me know if I can assist you in the future.   Screening recommendations/referrals: Colonoscopy: N/A  Mammogram : N/A  Bone Density: Recommended yearly ophthalmology/optometry visit for glaucoma screening and checkup Recommended yearly dental visit for hygiene and checkup  Vaccinations: Influenza vaccine; Due annually  Pneumococcal vaccine: Up to date  Tdap vaccine  Up to date Shingles vaccine   Advanced directives: No   Conditions/risks identified: Advance age female > 66 yrs,Hypertension   Next appointment: 1 year    Preventive Care 84 Years and Older, Female Preventive care refers to lifestyle choices and visits with your health care provider that can promote health and wellness. What does preventive care include?  A yearly physical exam. This is also called an annual well check.  Dental exams once or twice a year.  Routine eye exams. Ask your health care provider how often you should have your eyes checked.  Personal lifestyle choices, including:  Daily care of your teeth and gums.  Regular physical activity.  Eating a healthy diet.  Avoiding tobacco and drug use.  Limiting alcohol use.  Practicing safe sex.  Taking low-dose aspirin every day.  Taking vitamin and mineral supplements as recommended by your health care provider. What happens during an annual well check? The services and screenings done by your health care provider during your annual well check will depend on your age, overall health, lifestyle risk factors, and family history of disease. Counseling  Your health care provider may ask you questions about your:  Alcohol use.  Tobacco use.  Drug use.  Emotional well-being.  Home and relationship well-being.  Sexual activity.  Eating  habits.  History of falls.  Memory and ability to understand (cognition).  Work and work Statistician.  Reproductive health. Screening  You may have the following tests or measurements:  Height, weight, and BMI.  Blood pressure.  Lipid and cholesterol levels. These may be checked every 5 years, or more frequently if you are over 80 years old.  Skin check.  Lung cancer screening. You may have this screening every year starting at age 54 if you have a 30-pack-year history of smoking and currently smoke or have quit within the past 15 years.  Fecal occult blood test (FOBT) of the stool. You may have this test every year starting at age 76.  Flexible sigmoidoscopy or colonoscopy. You may have a sigmoidoscopy every 5 years or a colonoscopy every 10 years starting at age 78.  Hepatitis C blood test.  Hepatitis B blood test.  Sexually transmitted disease (STD) testing.  Diabetes screening. This is done by checking your blood sugar (glucose) after you have not eaten for a while (fasting). You may have this done every 1-3 years.  Bone density scan. This is done to screen for osteoporosis. You may have this done starting at age 106.  Mammogram. This may be done every 1-2 years. Talk to your health care provider about how often you should have regular mammograms. Talk with your health care provider about your test results, treatment options, and if necessary, the need for more tests. Vaccines  Your health care provider may recommend certain vaccines, such as:  Influenza vaccine. This is recommended every year.  Tetanus, diphtheria, and acellular pertussis (Tdap, Td) vaccine. You may need a Td booster every 10 years.  Zoster vaccine. You may need this after age 31.  Pneumococcal 13-valent conjugate (PCV13) vaccine. One dose is recommended after age 56.  Pneumococcal polysaccharide (PPSV23) vaccine. One dose is recommended after age 78. Talk to your health care provider about which  screenings and vaccines you need and how often you need them. This information is not intended to replace advice given to you by your health care provider. Make sure you discuss any questions you have with your health care provider. Document Released: 11/20/2015 Document Revised: 07/13/2016 Document Reviewed: 08/25/2015 Elsevier Interactive Patient Education  2017 [place] Prevention in the Home Falls can cause injuries. They can happen to people of all ages. There are many things you can do to make your home safe and to help prevent falls. What can I do on the outside of my home?  Regularly fix the edges of walkways and driveways and fix any cracks.  Remove anything that might make you trip as you walk through a door, such as a raised step or threshold.  Trim any bushes or trees on the path to your home.  Use bright outdoor lighting.  Clear any walking paths of anything that might make someone trip, such as rocks or tools.  Regularly check to see if handrails are loose or broken. Make sure that both sides of any steps have handrails.  Any raised decks and porches should have guardrails on the edges.  Have any leaves, snow, or ice cleared regularly.  Use sand or salt on walking paths during winter.  Clean up any spills in your garage right away. This includes oil or grease spills. What can I do in the bathroom?  Use night lights.  Install grab bars by the toilet and in the tub and shower. Do not use towel bars as grab bars.  Use non-skid mats or decals in the tub or shower.  If you need to sit down in the shower, use a plastic, non-slip stool.  Keep the floor dry. Clean up any water that spills on the floor as soon as it happens.  Remove soap buildup in the tub or shower regularly.  Attach bath mats securely with double-sided non-slip rug tape.  Do not have throw rugs and other things on the floor that can make you trip. What can I do in the bedroom?  Use  night lights.  Make sure that you have a light by your bed that is easy to reach.  Do not use any sheets or blankets that are too big for your bed. They should not hang down onto the floor.  Have a firm chair that has side arms. You can use this for support while you get dressed.  Do not have throw rugs and other things on the floor that can make you trip. What can I do in the kitchen?  Clean up any spills right away.  Avoid walking on wet floors.  Keep items that you use a lot in easy-to-reach places.  If you need to reach something above you, use a strong step stool that has a grab bar.  Keep electrical cords out of the way.  Do not use floor polish or wax that makes floors slippery. If you must use wax, use non-skid floor wax.  Do not have throw rugs and other things on the floor that can make you trip. What can I do with my stairs?  Do not leave any items on the stairs.  Make sure that there are  handrails on both sides of the stairs and use them. Fix handrails that are broken or loose. Make sure that handrails are as long as the stairways.  Check any carpeting to make sure that it is firmly attached to the stairs. Fix any carpet that is loose or worn.  Avoid having throw rugs at the top or bottom of the stairs. If you do have throw rugs, attach them to the floor with carpet tape.  Make sure that you have a light switch at the top of the stairs and the bottom of the stairs. If you do not have them, ask someone to add them for you. What else can I do to help prevent falls?  Wear shoes that:  Do not have high heels.  Have rubber bottoms.  Are comfortable and fit you well.  Are closed at the toe. Do not wear sandals.  If you use a stepladder:  Make sure that it is fully opened. Do not climb a closed stepladder.  Make sure that both sides of the stepladder are locked into place.  Ask someone to hold it for you, if possible.  Clearly mark and make sure that you  can see:  Any grab bars or handrails.  First and last steps.  Where the edge of each step is.  Use tools that help you move around (mobility aids) if they are needed. These include:  Canes.  Walkers.  Scooters.  Crutches.  Turn on the lights when you go into a dark area. Replace any light bulbs as soon as they burn out.  Set up your furniture so you have a clear path. Avoid moving your furniture around.  If any of your floors are uneven, fix them.  If there are any pets around you, be aware of where they are.  Review your medicines with your doctor. Some medicines can make you feel dizzy. This can increase your chance of falling. Ask your doctor what other things that you can do to help prevent falls. This information is not intended to replace advice given to you by your health care provider. Make sure you discuss any questions you have with your health care provider. Document Released: 08/20/2009 Document Revised: 03/31/2016 Document Reviewed: 11/28/2014 Elsevier Interactive Patient Education  2017 [employer].

## 2020-07-07 NOTE — Telephone Encounter (Signed)
Ms. [name], [name] are scheduled for a virtual visit with your provider today.    Just as we do with appointments in the office, we must obtain your consent to participate.  Your consent will be active for this visit and any virtual visit you may have with one of our providers in the next 365 days.    If you have a MyChart account, I can also send a copy of this consent to you electronically.  All virtual visits are billed to your insurance company just like a traditional visit in the office.  As this is a virtual visit, video technology does not allow for your provider to perform a traditional examination.  This may limit your provider's ability to fully assess your condition.  If your provider identifies any concerns that need to be evaluated in person or the need to arrange testing such as labs, EKG, etc, we will make arrangements to do so.    Although advances in technology are sophisticated, we cannot ensure that it will always work on either your end or our end.  If the connection with a video visit is poor, we may have to switch to a telephone visit.  With either a video or telephone visit, we are not always able to ensure that we have a secure connection.   I need to obtain your verbal consent now.   Are you willing to proceed with your visit today?   [name] has provided verbal consent on 07/07/2020 for a virtual visit (video or telephone).   Tanna Savoy, CMA 07/07/2020  1:00 PM

## 2020-07-20 ENCOUNTER — Telehealth: Payer: Self-pay | Admitting: *Deleted

## 2020-07-20 NOTE — Telephone Encounter (Signed)
Stacy notified and agreed.

## 2020-07-20 NOTE — Telephone Encounter (Signed)
Yes please get tested and continue self Quarantine and hand washing.If having any symptoms Notify provide as soon as possible.Wishing your grandson a quick recovery.

## 2020-07-20 NOTE — Telephone Encounter (Signed)
[name], daughter called and stated that patient lives with her. Stated that on Friday her 84 year old son started showing symptoms of Cough and tested Positive for Covid and Strep.  Stated that patient usually stays in her room and only comes out for food. Stated that she has been Quarantining the son from patient.  I advised that she has start wearing a mask when around patient and cleaning with Clorox wipes and washing hands.  Stated that she is going to go Wednesday to [facility] to have her and patient tested for Covid just to be safe.  Just wanted to let you know and if you had any other advise.

## 2020-07-22 ENCOUNTER — Other Ambulatory Visit: Payer: PPO

## 2020-07-22 DIAGNOSIS — Z1152 Encounter for screening for COVID-19: Secondary | ICD-10-CM | POA: Diagnosis not present

## 2020-07-28 ENCOUNTER — Other Ambulatory Visit: Payer: Self-pay | Admitting: Family

## 2020-07-28 DIAGNOSIS — R0981 Nasal congestion: Secondary | ICD-10-CM | POA: Diagnosis not present

## 2020-07-28 DIAGNOSIS — Z20822 Contact with and (suspected) exposure to covid-19: Secondary | ICD-10-CM | POA: Diagnosis not present

## 2020-08-03 NOTE — Telephone Encounter (Signed)
Message routed to [place], [name], NP

## 2020-08-07 ENCOUNTER — Other Ambulatory Visit: Payer: Self-pay | Admitting: Family

## 2020-08-17 ENCOUNTER — Other Ambulatory Visit: Payer: Self-pay

## 2020-08-17 ENCOUNTER — Ambulatory Visit (INDEPENDENT_AMBULATORY_CARE_PROVIDER_SITE_OTHER): Payer: PPO | Admitting: *Deleted

## 2020-08-17 DIAGNOSIS — Z23 Encounter for immunization: Secondary | ICD-10-CM

## 2020-08-20 ENCOUNTER — Other Ambulatory Visit: Payer: Self-pay | Admitting: Family

## 2020-09-04 DIAGNOSIS — Z95810 Presence of automatic (implantable) cardiac defibrillator: Secondary | ICD-10-CM | POA: Diagnosis not present

## 2020-09-09 NOTE — Addendum Note (Signed)
Addended byMarlowe Sax C on: 09/09/2020 01:12 PM   Modules accepted: Level of Service

## 2020-09-28 ENCOUNTER — Other Ambulatory Visit: Payer: Self-pay

## 2020-09-28 ENCOUNTER — Other Ambulatory Visit: Payer: Self-pay | Admitting: Family

## 2020-09-28 MED ORDER — PAROXETINE HCL 30 MG PO TABS
30.0000 mg | ORAL_TABLET | Freq: Every day | ORAL | 1 refills | Status: DC
Start: 2020-09-28 — End: 2021-03-26

## 2020-09-28 NOTE — Telephone Encounter (Signed)
Patients granddaughter called stating her grandmother will run out of Paxil on Friday, the pharmacy sent [place] a request, and we denied it. Erline Levine was questioning why rx was denied.   I reviewed request and rx was denied with the indication refill not appropriate, yet it appears refill is appropriate and should be approved.  RX was sent to the pharmacy for 90 day supply as requested by Erline Levine

## 2020-10-08 DIAGNOSIS — Z4502 Encounter for adjustment and management of automatic implantable cardiac defibrillator: Secondary | ICD-10-CM | POA: Diagnosis not present

## 2020-10-08 DIAGNOSIS — I4821 Permanent atrial fibrillation: Secondary | ICD-10-CM | POA: Diagnosis not present

## 2020-10-16 ENCOUNTER — Other Ambulatory Visit: Payer: Self-pay | Admitting: Family

## 2020-10-16 DIAGNOSIS — E785 Hyperlipidemia, unspecified: Secondary | ICD-10-CM

## 2020-10-24 ENCOUNTER — Other Ambulatory Visit: Payer: Self-pay | Admitting: Family

## 2020-10-24 DIAGNOSIS — E785 Hyperlipidemia, unspecified: Secondary | ICD-10-CM

## 2020-12-03 DIAGNOSIS — E039 Hypothyroidism, unspecified: Secondary | ICD-10-CM

## 2020-12-03 DIAGNOSIS — I10 Essential (primary) hypertension: Secondary | ICD-10-CM

## 2020-12-03 DIAGNOSIS — R7303 Prediabetes: Secondary | ICD-10-CM

## 2020-12-03 NOTE — Telephone Encounter (Signed)
Medina-Vargas, Monina C, NP  You 58 minutes ago (9:56 AM)     Pls add Mg and Vitamin D level for the lab that will be drawn on 12/08/20 and let dranddaughter know.   Message text        Labs added to future lab order.

## 2020-12-03 NOTE — Telephone Encounter (Signed)
Pended and sent to [facility] due to Webb Silversmith out of office.

## 2020-12-04 DIAGNOSIS — Z95810 Presence of automatic (implantable) cardiac defibrillator: Secondary | ICD-10-CM | POA: Diagnosis not present

## 2020-12-08 ENCOUNTER — Other Ambulatory Visit: Payer: Self-pay

## 2020-12-08 ENCOUNTER — Other Ambulatory Visit: Payer: PPO

## 2020-12-08 DIAGNOSIS — R7303 Prediabetes: Secondary | ICD-10-CM | POA: Diagnosis not present

## 2020-12-08 DIAGNOSIS — E039 Hypothyroidism, unspecified: Secondary | ICD-10-CM | POA: Diagnosis not present

## 2020-12-08 DIAGNOSIS — I10 Essential (primary) hypertension: Secondary | ICD-10-CM

## 2020-12-08 DIAGNOSIS — E785 Hyperlipidemia, unspecified: Secondary | ICD-10-CM

## 2020-12-09 LAB — CBC WITH DIFFERENTIAL/PLATELET
Absolute Monocytes: 548 cells/uL (ref 200–950)
Basophils Absolute: 38 cells/uL (ref 0–200)
Basophils Relative: 0.6 %
Eosinophils Absolute: 353 cells/uL (ref 15–500)
Eosinophils Relative: 5.6 %
HCT: 42 % (ref 35.0–45.0)
Hemoglobin: 13.9 g/dL (ref 11.7–15.5)
Lymphs Abs: 2104 cells/uL (ref 850–3900)
MCH: 30.4 pg (ref 27.0–33.0)
MCHC: 33.1 g/dL (ref 32.0–36.0)
MCV: 91.9 fL (ref 80.0–100.0)
MPV: 10.2 fL (ref 7.5–12.5)
Monocytes Relative: 8.7 %
Neutro Abs: 3257 cells/uL (ref 1500–7800)
Neutrophils Relative %: 51.7 %
Platelets: 261 10*3/uL (ref 140–400)
RBC: 4.57 10*6/uL (ref 3.80–5.10)
RDW: 13 % (ref 11.0–15.0)
Total Lymphocyte: 33.4 %
WBC: 6.3 10*3/uL (ref 3.8–10.8)

## 2020-12-09 LAB — COMPLETE METABOLIC PANEL WITH GFR
AG Ratio: 1.5 (calc) (ref 1.0–2.5)
ALT: 14 U/L (ref 6–29)
AST: 24 U/L (ref 10–35)
Albumin: 4.3 g/dL (ref 3.6–5.1)
Alkaline phosphatase (APISO): 44 U/L (ref 37–153)
BUN: 13 mg/dL (ref 7–25)
CO2: 25 mmol/L (ref 20–32)
Calcium: 9.7 mg/dL (ref 8.6–10.4)
Chloride: 107 mmol/L (ref 98–110)
Creat: 0.73 mg/dL (ref 0.60–0.88)
GFR, Est African American: 83 mL/min/{1.73_m2} (ref >=60)
GFR, Est Non African American: 71 mL/min/{1.73_m2} (ref >=60)
Globulin: 2.8 g/dL (calc) (ref 1.9–3.7)
Glucose, Bld: 105 mg/dL — ABNORMAL HIGH (ref 65–99)
Potassium: 4.6 mmol/L (ref 3.5–5.3)
Sodium: 141 mmol/L (ref 135–146)
Total Bilirubin: 0.5 mg/dL (ref 0.2–1.2)
Total Protein: 7.1 g/dL (ref 6.1–8.1)

## 2020-12-09 LAB — HEMOGLOBIN A1C
Hgb A1c MFr Bld: 5.9 % of total Hgb — ABNORMAL HIGH (ref <5.7)
Mean Plasma Glucose: 123 mg/dL
eAG (mmol/L): 6.8 mmol/L

## 2020-12-09 LAB — LIPID PANEL
Cholesterol: 198 mg/dL (ref <200)
HDL: 51 mg/dL (ref >=50)
LDL Cholesterol (Calc): 112 mg/dL (calc) — ABNORMAL HIGH
Non-HDL Cholesterol (Calc): 147 mg/dL (calc) — ABNORMAL HIGH (ref <130)
Total CHOL/HDL Ratio: 3.9 (calc) (ref <5.0)
Triglycerides: 230 mg/dL — ABNORMAL HIGH (ref <150)

## 2020-12-09 LAB — TSH: TSH: 3.64 mIU/L (ref 0.40–4.50)

## 2020-12-09 LAB — VITAMIN D 25 HYDROXY (VIT D DEFICIENCY, FRACTURES): Vit D, 25-Hydroxy: 44 ng/mL (ref 30–100)

## 2020-12-09 LAB — MAGNESIUM: Magnesium: 2 mg/dL (ref 1.5–2.5)

## 2020-12-11 ENCOUNTER — Encounter: Payer: Self-pay | Admitting: Family

## 2020-12-11 ENCOUNTER — Other Ambulatory Visit: Payer: Self-pay

## 2020-12-11 ENCOUNTER — Ambulatory Visit (INDEPENDENT_AMBULATORY_CARE_PROVIDER_SITE_OTHER): Payer: PPO | Admitting: Family

## 2020-12-11 VITALS — BP 130/88 | HR 75 | Temp 97.3°F | Resp 16 | Ht 64.0 in | Wt 169.2 lb

## 2020-12-11 DIAGNOSIS — R7303 Prediabetes: Secondary | ICD-10-CM | POA: Diagnosis not present

## 2020-12-11 DIAGNOSIS — J309 Allergic rhinitis, unspecified: Secondary | ICD-10-CM

## 2020-12-11 DIAGNOSIS — E039 Hypothyroidism, unspecified: Secondary | ICD-10-CM | POA: Diagnosis not present

## 2020-12-11 DIAGNOSIS — Z66 Do not resuscitate: Secondary | ICD-10-CM | POA: Diagnosis not present

## 2020-12-11 DIAGNOSIS — E785 Hyperlipidemia, unspecified: Secondary | ICD-10-CM

## 2020-12-11 DIAGNOSIS — I10 Essential (primary) hypertension: Secondary | ICD-10-CM

## 2020-12-11 DIAGNOSIS — F411 Generalized anxiety disorder: Secondary | ICD-10-CM

## 2020-12-11 MED ORDER — FEXOFENADINE HCL 180 MG PO TABS: 180.0000 mg | ORAL_TABLET | Freq: Every day | ORAL | 3 refills | Status: DC

## 2020-12-11 NOTE — Patient Instructions (Signed)

## 2020-12-11 NOTE — Progress Notes (Signed)
Provider: [name] FNP-C   Keneth Borg, [name], NP  Patient Care Team: Robynn Marcel, [name], NP as PCP - General (Family Medicine) [name], MD as Consulting Physician (Dermatology) [name], MD as Consulting Physician (Otolaryngology) [name], MD as Referring Physician (Cardiology)  Extended Emergency Contact Information Primary Emergency Contact: [name] [place] Phone: [phone] Work Phone: [phone] Relation: Daughter Secondary Emergency Contact: [name] [place] Phone: [phone] Mobile Phone: [phone] Relation: Granddaughter  Code Status: DNR  Goals of care: Advanced Directive information Advanced Directives 12/11/2020  Does Patient Have a Medical Advance Directive? Yes  Type of Advance Directive Out of facility DNR (pink MOST or yellow form)  Does patient want to make changes to medical advance directive? No - Patient declined  Copy of [place] in Chart? -  Would patient like information on creating a medical advance directive? -     Chief Complaint  Patient presents with  . Medical Management of Chronic Issues    6 month follow up.    HPI:  Pt is a 87 y.o. female seen today for 6 months follow up for medical management of chronic diseases.she is here with her daughter [name].she states runny nose has worsen during the winter.worst when she gets around.she denies any fever or chills. Recent lab results reviewed and discussed with patient and daughter during visit.  Prediabetes -  Recent Hgb A1C is higher than previous level 5.9 previous 5.7.Has been eating biscuits and cookies.   Hyperlipidemia - cholesterol and HDL are normal.TRG are elevated compared to previous level. 230 previous 140 and LDL 112.discussed cutting down on carbohydrates,saturated fats and increase vegetables in diet.   States continue to take her levothyroxine on an empty stomach.   Generalized  anxiety - states symptoms stable.lives with her daughter.continues to complete her own activity of living. Has had no falls episodes.states tries to be very careful.     Past Medical History:  Diagnosis Date  . Acid reflux   . Arthritis   . Atrial fibrillation ([place])   . Breast cancer [facility]) 2011   Left Breast Cancer  . Cancer [facility]) 2011   Left Breast Cancer  . Depression   . Hyperlipidemia   . Hypertension   . Insomnia   . Osteoporosis   . Pacemaker 2010   [place]  . Pelvic fracture ([place]) 2010  . Thyroid disease    Past Surgical History:  Procedure Laterality Date  . ABDOMINAL HYSTERECTOMY  35 yrs ago  . BREAST LUMPECTOMY Left 05/23/10  . CARDIAC DEFIBRILLATOR PLACEMENT  2 years ago  . CHOLECYSTECTOMY  50 YEARS AGO  . ELBOW SURGERY  2006    Allergies  Allergen Reactions  . Codeine Nausea Only    Allergies as of 12/11/2020      Reactions   Codeine Nausea Only      Medication List       Accurate as of December 11, 2020 10:06 AM. If you have any questions, ask your nurse or doctor.        STOP taking these medications   Melatonin 5 MG Chew Stopped by: [name] Camya Haydon, NP     TAKE these medications   ABC PLUS PO Take 1 tablet by mouth daily.   acetaminophen 650 MG CR tablet Commonly known as: TYLENOL Take 650 mg by mouth every 8 (eight) hours as needed for pain.   Align 4 MG Caps Take 1 capsule (4 mg  total) by mouth daily.   Calcium Carb-Cholecalciferol 600-800 MG-UNIT Tabs Take 1 tablet by mouth every morning.   cholecalciferol 25 MCG (1000 UNIT) tablet Commonly known as: VITAMIN D3 Take 1,000 Units by mouth every morning.   gabapentin 600 MG tablet Commonly known as: NEURONTIN TAKE 1 TABLET BY MOUTH TWICE A DAY AS NEEDED   guaiFENesin 600 MG 12 hr tablet Commonly known as: MUCINEX Take 600 mg by mouth 2 (two) times daily.   ketoconazole 2 % cream Commonly known as: NIZORAL Apply 1 application topically 2 (two) times daily as needed (for  2 weeks as needed for flares).   levothyroxine 75 MCG tablet Commonly known as: SYNTHROID Take 75 mcg by mouth daily before breakfast. Patient can not take generic synthroid   Loratadine 10 MG Caps Take 1 capsule by mouth daily. What changed: Another medication with the same name was removed. Continue taking this medication, and follow the directions you see here. Changed by: Sandrea Hughs, NP   metoprolol succinate 25 MG 24 hr tablet Commonly known as: TOPROL-XL Take 25 mg by mouth daily.   Omega-3 1000 MG Caps Take 1 capsule by mouth daily.   pantoprazole 40 MG tablet Commonly known as: PROTONIX TAKE 1 TABLET BY MOUTH ONCE DAILY   PARoxetine 30 MG tablet Commonly known as: PAXIL Take 1 tablet (30 mg total) by mouth daily.   potassium chloride 10 MEQ CR capsule Commonly known as: MICRO-K TAKE 1 CAPSULE BY MOUTH ONCE DAILY   Pradaxa 75 MG Caps capsule Generic drug: dabigatran Take 75 mg by mouth 2 (two) times daily.   pravastatin 10 MG tablet Commonly known as: PRAVACHOL TAKE 1 TABLET BY MOUTH DAILY.   vitamin C 500 MG tablet Commonly known as: ASCORBIC ACID Take 500 mg by mouth 2 (two) times daily.       Review of Systems  Constitutional: Negative for appetite change, chills, fatigue and fever.  HENT: Positive for hearing loss. Negative for congestion, postnasal drip, rhinorrhea, sinus pressure, sinus pain, sneezing and sore throat.   Eyes: Negative for discharge, redness and itching.  Respiratory: Negative for cough, chest tightness, shortness of breath and wheezing.   Cardiovascular: Negative for chest pain, palpitations and leg swelling.  Gastrointestinal: Negative for abdominal distention, abdominal pain, constipation, diarrhea, nausea and vomiting.       Miralax effective   Endocrine: Negative for cold intolerance, heat intolerance, polydipsia, polyphagia and polyuria.  Genitourinary: Negative for dysuria, flank pain, frequency and urgency.       Wears  incontinent pads   Musculoskeletal: Positive for arthralgias and gait problem. Negative for joint swelling and myalgias.  Skin: Negative for color change, pallor and rash.  Neurological: Negative for dizziness, speech difficulty, weakness, light-headedness, numbness and headaches.  Hematological: Does not bruise/bleed easily.  Psychiatric/Behavioral: Negative for agitation, behavioral problems, confusion and sleep disturbance. The patient is not nervous/anxious.     Immunization History  Administered Date(s) Administered  . Fluad Quad(high Dose 65+) 08/01/2019, 08/17/2020  . Influenza, High Dose Seasonal PF 07/31/2018  . Influenza-Unspecified 06/12/2015  . PFIZER(Purple Top)SARS-COV-2 Vaccination 12/16/2019, 01/09/2020, 08/11/2020  . Pneumococcal Conjugate-13 12/23/2016  . Pneumococcal Polysaccharide-23 10/12/2003  . Tdap 12/25/2017   Pertinent  Health Maintenance Due  Topic Date Due  . INFLUENZA VACCINE  Completed  . DEXA SCAN  Completed  . PNA vac Low Risk Adult  Completed   Fall Risk  12/11/2020 07/07/2020 06/09/2020 05/08/2020 01/08/2020  Falls in the past year? 0 0 0 0 0  Number falls in past yr: 0 0 0 0 0  Injury with Fall? 0 0 0 0 -   Functional Status Survey:    Vitals:   12/11/20 0952  BP: 130/88  Pulse: 75  Resp: 16  Temp: (!) 97.3 F (36.3 C)  SpO2: 97%  Weight: 169 lb 3.2 oz (76.7 kg)  Height: $Remove'5\' 4"'fxAvwXm$  (1.626 m)   Body mass index is 29.04 kg/m. Physical Exam Vitals reviewed.  Constitutional:      General: She is not in acute distress.    Appearance: She is not ill-appearing.  HENT:     Head: Normocephalic.     Right Ear: Tympanic membrane, ear canal and external ear normal. There is no impacted cerumen.     Left Ear: Tympanic membrane, ear canal and external ear normal. There is no impacted cerumen.     Nose: Nose normal. No congestion or rhinorrhea.     Mouth/Throat:     Mouth: Mucous membranes are moist.     Pharynx: Oropharynx is clear. No oropharyngeal  exudate or posterior oropharyngeal erythema.  Eyes:     General: No scleral icterus.       Right eye: No discharge.        Left eye: No discharge.     Extraocular Movements: Extraocular movements intact.     Conjunctiva/sclera: Conjunctivae normal.     Pupils: Pupils are equal, round, and reactive to light.  Neck:     Vascular: No carotid bruit.  Cardiovascular:     Rate and Rhythm: Normal rate and regular rhythm.     Pulses: Normal pulses.     Heart sounds: Normal heart sounds. No murmur heard. No friction rub. No gallop.   Pulmonary:     Effort: Pulmonary effort is normal. No respiratory distress.     Breath sounds: Normal breath sounds. No wheezing, rhonchi or rales.  Chest:     Chest wall: No tenderness.  Abdominal:     General: Bowel sounds are normal. There is no distension.     Palpations: There is no mass.     Tenderness: There is no abdominal tenderness. There is no right CVA tenderness, left CVA tenderness, guarding or rebound.  Musculoskeletal:        General: No swelling or tenderness.     Cervical back: Normal range of motion. No rigidity or tenderness.     Right lower leg: No edema.     Left lower leg: No edema.     Comments: Unsteady gait   Lymphadenopathy:     Cervical: No cervical adenopathy.  Skin:    General: Skin is warm and dry.     Coloration: Skin is not pale.     Findings: No bruising, erythema or rash.  Neurological:     Mental Status: She is alert and oriented to person, place, and time.     Cranial Nerves: No cranial nerve deficit.     Sensory: No sensory deficit.     Motor: No weakness.     Coordination: Coordination normal.     Gait: Gait abnormal.  Psychiatric:        Mood and Affect: Mood normal.        Behavior: Behavior normal.        Thought Content: Thought content normal.        Judgment: Judgment normal.     Labs reviewed: Recent Labs    06/02/20 0857 12/08/20 0901  NA 141 141  K 4.6 4.6  CL 106 107  CO2 28 25  GLUCOSE  109* 105*  BUN 16 13  CREATININE 0.73 0.73  CALCIUM 9.7 9.7  MG  --  2.0   Recent Labs    06/02/20 0857 12/08/20 0901  AST 21 24  ALT 15 14  BILITOT 0.6 0.5  PROT 6.5 7.1   Recent Labs    06/02/20 0857 12/08/20 0901  WBC 6.3 6.3  NEUTROABS 3,339 3,257  HGB 13.4 13.9  HCT 41.4 42.0  MCV 92.8 91.9  PLT 260 261   Lab Results  Component Value Date   TSH 3.64 12/08/2020   Lab Results  Component Value Date   HGBA1C 5.9 (H) 12/08/2020   Lab Results  Component Value Date   CHOL 198 12/08/2020   HDL 51 12/08/2020   LDLCALC 112 (H) 12/08/2020   TRIG 230 (H) 12/08/2020   CHOLHDL 3.9 12/08/2020    Significant Diagnostic Results in last 30 days:  No results found.  Assessment/Plan 1. Essential hypertension B/p well controlled. - continue on metoprolol succinate 25 mg tablet daily. - continue on Pravastatin. - Recent CR normal at baseline.   - CBC with Differential/Platelet; Future - CMP with eGFR([facility]); Future  2. Hyperlipidemia, unspecified hyperlipidemia type LDL not at goal. - continue on pravastatin 10 mg tablet will avoid increase dose to prevent muscle weakness due to her advance age. - Dietary modification recommended and exercise as tolerated.  - Lipid panel; Future  3. Hypothyroidism, unspecified type Lab Results  Component Value Date   TSH 3.64 12/08/2020   - continue on levothyroxine 75 mcg tablet daily on empty stomach.  - TSH; Future  4. Prediabetes Lab Results  Component Value Date   HGBA1C 5.9 (H) 12/08/2020  Slightly higher than previous. - encouraged to cut down on biscuits and sweets.    5. Generalized anxiety disorder Symptoms stable.continue to monitor.   6. Allergic rhinitis, unspecified seasonality, unspecified trigger - D/c loratadine  - start fexofenadine  - fexofenadine (ALLEGRA ALLERGY) 180 MG tablet; Take 1 tablet (180 mg total) by mouth daily.  Dispense: 30 tablet; Refill: 3  7. DNR (do not resuscitate) DNR order in  chart.  - Do not attempt resuscitation (DNR)  Family/ staff Communication: Reviewed plan of care with patient and daughter.   Labs/tests ordered:  - CBC with Differential/Platelet; Future - CMP with eGFR([facility]); Future - TSH; Future - Lipid panel; Future - Hgb A1C   Next Appointment : 6 months for medical management of chronic issues.Fasting labs prior to visit   Sandrea Hughs, NP

## 2020-12-17 NOTE — Telephone Encounter (Signed)
Message routed to [place], [name], NP to review and reply directly back to patients daughter

## 2020-12-24 ENCOUNTER — Other Ambulatory Visit: Payer: Self-pay | Admitting: Family

## 2021-01-18 ENCOUNTER — Other Ambulatory Visit: Payer: Self-pay | Admitting: Family

## 2021-02-12 ENCOUNTER — Other Ambulatory Visit: Payer: Self-pay | Admitting: Family

## 2021-02-18 DIAGNOSIS — L814 Other melanin hyperpigmentation: Secondary | ICD-10-CM | POA: Diagnosis not present

## 2021-02-18 DIAGNOSIS — L570 Actinic keratosis: Secondary | ICD-10-CM | POA: Diagnosis not present

## 2021-02-18 DIAGNOSIS — L821 Other seborrheic keratosis: Secondary | ICD-10-CM | POA: Diagnosis not present

## 2021-02-18 DIAGNOSIS — Z85828 Personal history of other malignant neoplasm of skin: Secondary | ICD-10-CM | POA: Diagnosis not present

## 2021-02-18 DIAGNOSIS — L820 Inflamed seborrheic keratosis: Secondary | ICD-10-CM | POA: Diagnosis not present

## 2021-03-05 DIAGNOSIS — Z95810 Presence of automatic (implantable) cardiac defibrillator: Secondary | ICD-10-CM | POA: Diagnosis not present

## 2021-03-12 ENCOUNTER — Other Ambulatory Visit: Payer: Self-pay | Admitting: Family

## 2021-03-25 ENCOUNTER — Other Ambulatory Visit: Payer: Self-pay | Admitting: Family

## 2021-03-26 NOTE — Telephone Encounter (Signed)
High risk or very high risk warning populated when attempting to refill medication. RX request sent to PCP for review and approval if warranted.   

## 2021-04-09 ENCOUNTER — Other Ambulatory Visit: Payer: Self-pay | Admitting: Family

## 2021-04-09 DIAGNOSIS — Z1231 Encounter for screening mammogram for malignant neoplasm of breast: Secondary | ICD-10-CM

## 2021-04-12 ENCOUNTER — Other Ambulatory Visit: Payer: Self-pay | Admitting: Family

## 2021-04-12 DIAGNOSIS — E785 Hyperlipidemia, unspecified: Secondary | ICD-10-CM

## 2021-04-30 DIAGNOSIS — I472 Ventricular tachycardia: Secondary | ICD-10-CM | POA: Diagnosis not present

## 2021-04-30 DIAGNOSIS — I4821 Permanent atrial fibrillation: Secondary | ICD-10-CM | POA: Diagnosis not present

## 2021-04-30 DIAGNOSIS — Z95810 Presence of automatic (implantable) cardiac defibrillator: Secondary | ICD-10-CM | POA: Diagnosis not present

## 2021-04-30 DIAGNOSIS — I10 Essential (primary) hypertension: Secondary | ICD-10-CM | POA: Diagnosis not present

## 2021-04-30 DIAGNOSIS — H903 Sensorineural hearing loss, bilateral: Secondary | ICD-10-CM | POA: Diagnosis not present

## 2021-05-25 ENCOUNTER — Other Ambulatory Visit: Payer: Self-pay

## 2021-05-25 ENCOUNTER — Other Ambulatory Visit: Payer: PPO

## 2021-05-25 DIAGNOSIS — I10 Essential (primary) hypertension: Secondary | ICD-10-CM | POA: Diagnosis not present

## 2021-05-25 DIAGNOSIS — R7303 Prediabetes: Secondary | ICD-10-CM

## 2021-05-25 DIAGNOSIS — E039 Hypothyroidism, unspecified: Secondary | ICD-10-CM | POA: Diagnosis not present

## 2021-05-25 DIAGNOSIS — E785 Hyperlipidemia, unspecified: Secondary | ICD-10-CM | POA: Diagnosis not present

## 2021-05-26 LAB — COMPLETE METABOLIC PANEL WITH GFR
AG Ratio: 1.7 (calc) (ref 1.0–2.5)
ALT: 14 U/L (ref 6–29)
AST: 22 U/L (ref 10–35)
Albumin: 4.4 g/dL (ref 3.6–5.1)
Alkaline phosphatase (APISO): 42 U/L (ref 37–153)
BUN: 14 mg/dL (ref 7–25)
CO2: 30 mmol/L (ref 20–32)
Calcium: 10.3 mg/dL (ref 8.6–10.4)
Chloride: 107 mmol/L (ref 98–110)
Creat: 0.81 mg/dL (ref 0.60–0.95)
Globulin: 2.6 g/dL (calc) (ref 1.9–3.7)
Glucose, Bld: 108 mg/dL — ABNORMAL HIGH (ref 65–99)
Potassium: 5 mmol/L (ref 3.5–5.3)
Sodium: 142 mmol/L (ref 135–146)
Total Bilirubin: 0.6 mg/dL (ref 0.2–1.2)
Total Protein: 7 g/dL (ref 6.1–8.1)
eGFR: 68 mL/min/{1.73_m2} (ref >=60)

## 2021-05-26 LAB — CBC WITH DIFFERENTIAL/PLATELET
Absolute Monocytes: 570 cells/uL (ref 200–950)
Basophils Absolute: 38 cells/uL (ref 0–200)
Basophils Relative: 0.6 %
Eosinophils Absolute: 192 cells/uL (ref 15–500)
Eosinophils Relative: 3 %
HCT: 41.3 % (ref 35.0–45.0)
Hemoglobin: 13.6 g/dL (ref 11.7–15.5)
Lymphs Abs: 2022 cells/uL (ref 850–3900)
MCH: 31.1 pg (ref 27.0–33.0)
MCHC: 32.9 g/dL (ref 32.0–36.0)
MCV: 94.3 fL (ref 80.0–100.0)
MPV: 10.4 fL (ref 7.5–12.5)
Monocytes Relative: 8.9 %
Neutro Abs: 3578 cells/uL (ref 1500–7800)
Neutrophils Relative %: 55.9 %
Platelets: 257 10*3/uL (ref 140–400)
RBC: 4.38 10*6/uL (ref 3.80–5.10)
RDW: 13.4 % (ref 11.0–15.0)
Total Lymphocyte: 31.6 %
WBC: 6.4 10*3/uL (ref 3.8–10.8)

## 2021-05-26 LAB — LIPID PANEL
Cholesterol: 156 mg/dL (ref <200)
HDL: 53 mg/dL (ref >=50)
LDL Cholesterol (Calc): 75 mg/dL (calc)
Non-HDL Cholesterol (Calc): 103 mg/dL (calc) (ref <130)
Total CHOL/HDL Ratio: 2.9 (calc) (ref <5.0)
Triglycerides: 180 mg/dL — ABNORMAL HIGH (ref <150)

## 2021-05-26 LAB — TSH: TSH: 2.44 mIU/L (ref 0.40–4.50)

## 2021-05-26 LAB — HEMOGLOBIN A1C
Hgb A1c MFr Bld: 5.9 % of total Hgb — ABNORMAL HIGH (ref <5.7)
Mean Plasma Glucose: 123 mg/dL
eAG (mmol/L): 6.8 mmol/L

## 2021-05-28 ENCOUNTER — Encounter: Payer: Self-pay | Admitting: Family

## 2021-05-28 ENCOUNTER — Other Ambulatory Visit: Payer: Self-pay

## 2021-05-28 ENCOUNTER — Ambulatory Visit (INDEPENDENT_AMBULATORY_CARE_PROVIDER_SITE_OTHER): Payer: PPO | Admitting: Family

## 2021-05-28 VITALS — BP 110/70 | HR 81 | Temp 96.2°F | Resp 16 | Ht 64.0 in | Wt 174.6 lb

## 2021-05-28 DIAGNOSIS — F5101 Primary insomnia: Secondary | ICD-10-CM

## 2021-05-28 DIAGNOSIS — E039 Hypothyroidism, unspecified: Secondary | ICD-10-CM

## 2021-05-28 DIAGNOSIS — I10 Essential (primary) hypertension: Secondary | ICD-10-CM | POA: Diagnosis not present

## 2021-05-28 DIAGNOSIS — E785 Hyperlipidemia, unspecified: Secondary | ICD-10-CM

## 2021-05-28 DIAGNOSIS — Z6829 Body mass index (BMI) 29.0-29.9, adult: Secondary | ICD-10-CM

## 2021-05-28 DIAGNOSIS — R7303 Prediabetes: Secondary | ICD-10-CM

## 2021-05-28 NOTE — Progress Notes (Signed)
Provider: [name] FNP-C   [name], Alicia Bucks, NP  Patient Care Team: [name], Alicia Bucks, NP as PCP - General (Family Medicine) Alicia Bookbinder, MD as Consulting Physician (Dermatology) Alicia Belfast, MD as Consulting Physician (Otolaryngology) Alicia Menghini, MD as Referring Physician (Cardiology)  Extended Emergency Contact Information Primary Emergency Contact: [name] [place] of [place] Phone: [phone] Work Phone: [phone] Relation: Daughter Secondary Emergency Contact: [name] [place] of [place] Phone: [phone] Mobile Phone: [phone] Relation: Granddaughter  Code Status:  DNR Goals of care: Advanced Directive information Advanced Directives 05/28/2021  Does Patient Have a Medical Advance Directive? Yes  Type of Advance Directive Out of facility DNR (pink MOST or yellow form)  Does patient want to make changes to medical advance directive? No - Patient declined  Copy of [place] in Chart? -  Would patient like information on creating a medical advance directive? -     Chief Complaint  Patient presents with   Medical Management of Chronic Issues    6 month follow up.    Immunizations    Discuss the need for Shingrix vaccine.     HPI:  Pt is a 87 y.o. female seen today for medical management of chronic diseases.She is denies any acute issues.Has a medical history of Hypertension,Afib,Hyperlipidemia ,GERD,Hypothyroidism,Osteoarthritis,Allergic Rhinitis,AICD present ,Depression and Anxiety,insomnia among other conditions. States still not sleeping well at times.mind wonders worsen when she takes naps during the day.used to be on Ambien but was stopped by previous PCP due to SE.Melatonin and trazodone did not work for her. Takes gabapentin at bedtime for pain sometimes helps her to sleep but states too much is going on with Husband being sick.  Unable to cut down on her Paxil today due to increased stress  level in family due to Husband being sick.  Appetite is good.No fall episodes continues to walk with her walker. Has gained weight since last visit though POA states was started on diuretic managed by Cardiologist.monitors weight at home which seems to be at her baseline.No cough,shortness of breath or edema.  Due for shingrix vaccine aware to get vaccine at her pharmacy.     Past Medical History:  Diagnosis Date   Acid reflux    Arthritis    Atrial fibrillation ([place])    Breast cancer ([place]) 2011   Left Breast Cancer   Cancer [facility]) 2011   Left Breast Cancer   Depression    Hyperlipidemia    Hypertension    Insomnia    Osteoporosis    Pacemaker 2010   WIITH DEFIB   Pelvic fracture ([place]) 2010   Thyroid disease    Past Surgical History:  Procedure Laterality Date   ABDOMINAL HYSTERECTOMY  35 yrs ago   BREAST LUMPECTOMY Left 05/23/10   CARDIAC DEFIBRILLATOR PLACEMENT  2 years ago   CHOLECYSTECTOMY  50 YEARS AGO   ELBOW SURGERY  2006    Allergies  Allergen Reactions   Codeine Nausea Only    Allergies as of 05/28/2021       Reactions   Codeine Nausea Only        Medication List        Accurate as of May 28, 2021 10:15 AM. If you have any questions, ask your nurse or doctor.          ABC PLUS PO Take 1 tablet by mouth daily.   acetaminophen 650 MG CR tablet Commonly known as: TYLENOL Take 650 mg by mouth every  8 (eight) hours as needed for pain.   Align 4 MG Caps Take 1 capsule (4 mg total) by mouth daily.   Calcium Carb-Cholecalciferol 600-800 MG-UNIT Tabs Take 1 tablet by mouth every morning.   cholecalciferol 25 MCG (1000 UNIT) tablet Commonly known as: VITAMIN D3 Take 1,000 Units by mouth every morning.   fexofenadine 180 MG tablet Commonly known as: Allegra Allergy Take 1 tablet (180 mg total) by mouth daily.   furosemide 40 MG tablet Commonly known as: LASIX Take 40 mg by mouth as needed.   gabapentin 600 MG tablet Commonly known as:  NEURONTIN TAKE 1 TABLET BY MOUTH TWICE A DAY AS NEEDED   guaiFENesin 600 MG 12 hr tablet Commonly known as: MUCINEX Take 600 mg by mouth 2 (two) times daily.   ketoconazole 2 % cream Commonly known as: NIZORAL Apply 1 application topically 2 (two) times daily as needed (for 2 weeks as needed for flares).   metoprolol succinate 25 MG 24 hr tablet Commonly known as: TOPROL-XL Take 25 mg by mouth daily.   Omega-3 1000 MG Caps Take 1 capsule by mouth daily.   pantoprazole 40 MG tablet Commonly known as: PROTONIX TAKE 1 TABLET BY MOUTH ONCE DAILY   PARoxetine 30 MG tablet Commonly known as: PAXIL TAKE 1 TABLET (30 MG TOTAL) BY MOUTH DAILY.   potassium chloride 10 MEQ CR capsule Commonly known as: MICRO-K TAKE 1 CAPSULE BY MOUTH ONCE DAILY   Pradaxa 75 MG Caps capsule Generic drug: dabigatran Take 75 mg by mouth 2 (two) times daily.   pravastatin 10 MG tablet Commonly known as: PRAVACHOL TAKE 1 TABLET BY MOUTH DAILY.   Synthroid 75 MCG tablet Generic drug: levothyroxine TAKE 1 TABLET BY MOUTH EVERY MORNING ON AN EMPTY STOMACH   vitamin C 500 MG tablet Commonly known as: ASCORBIC ACID Take 500 mg by mouth 2 (two) times daily.        Review of Systems  Constitutional:  Negative for appetite change, chills, fatigue, fever and unexpected weight change.  HENT:  Positive for hearing loss. Negative for congestion, dental problem, ear discharge, ear pain, facial swelling, nosebleeds, postnasal drip, rhinorrhea, sinus pressure, sinus pain, sneezing, sore throat, tinnitus and trouble swallowing.   Eyes:  Negative for pain, discharge, redness, itching and visual disturbance.  Respiratory:  Negative for cough, chest tightness, shortness of breath and wheezing.   Cardiovascular:  Negative for chest pain, palpitations and leg swelling.  Gastrointestinal:  Negative for abdominal distention, abdominal pain, blood in stool, constipation, diarrhea, nausea and vomiting.       Takes  Tums occasionally   Endocrine: Negative for cold intolerance, heat intolerance, polydipsia, polyphagia and polyuria.  Genitourinary:  Negative for difficulty urinating, dysuria, flank pain, frequency and urgency.  Musculoskeletal:  Negative for arthralgias, back pain, gait problem, joint swelling, myalgias, neck pain and neck stiffness.  Skin:  Negative for color change, pallor, rash and wound.  Neurological:  Negative for dizziness, syncope, speech difficulty, weakness, light-headedness, numbness and headaches.  Hematological:  Does not bruise/bleed easily.  Psychiatric/Behavioral:  Negative for agitation, behavioral problems, confusion, hallucinations, self-injury, sleep disturbance and suicidal ideas. The patient is not nervous/anxious.    Immunization History  Administered Date(s) Administered   Fluad Quad(high Dose 65+) 08/01/2019, 08/17/2020   Influenza, High Dose Seasonal PF 07/31/2018   Influenza-Unspecified 06/12/2015   Moderna Sars-Covid-2 Vaccination 03/20/2021   PFIZER(Purple Top)SARS-COV-2 Vaccination 12/16/2019, 01/09/2020, 08/11/2020   Pneumococcal Conjugate-13 12/23/2016   Pneumococcal Polysaccharide-23 10/12/2003   Tdap 12/25/2017  Pertinent  Health Maintenance Due  Topic Date Due   INFLUENZA VACCINE  06/07/2021   DEXA SCAN  Completed   PNA vac Low Risk Adult  Completed   Fall Risk  05/28/2021 12/11/2020 07/07/2020 06/09/2020 05/08/2020  Falls in the past year? 0 0 0 0 0  Number falls in past yr: 0 0 0 0 0  Injury with Fall? 0 0 0 0 0  Risk for fall due to : No Fall Risks - - - -  Follow up Falls evaluation completed - - - -   Functional Status Survey:    Vitals:   05/28/21 0959  BP: 110/70  Pulse: 81  Resp: 16  Temp: (!) 96.2 F (35.7 C)  SpO2: 97%  Weight: 174 lb 9.6 oz (79.2 kg)  Height: _0  (1.626 m)   Body mass index is 29.97 kg/m. Physical Exam Vitals reviewed.  Constitutional:      General: She is not in acute distress.    Appearance: Normal  appearance. She is overweight. She is not ill-appearing or diaphoretic.  HENT:     Head: Normocephalic.     Right Ear: Tympanic membrane, ear canal and external ear normal. There is no impacted cerumen.     Left Ear: Ear canal and external ear normal. There is impacted cerumen.     Ears:     Comments: Left ear cerumen lavaged with warm water and hydrogen peroxide moderate amounts of cerumen obtained using curette.Tolerated procedure well.TM clear without any signs of infection.     Nose: Nose normal. No congestion or rhinorrhea.     Mouth/Throat:     Mouth: Mucous membranes are moist.     Pharynx: Oropharynx is clear. No oropharyngeal exudate or posterior oropharyngeal erythema.  Eyes:     General: No scleral icterus.       Right eye: No discharge.        Left eye: No discharge.     Extraocular Movements: Extraocular movements intact.     Conjunctiva/sclera: Conjunctivae normal.     Pupils: Pupils are equal, round, and reactive to light.  Neck:     Vascular: No carotid bruit.  Cardiovascular:     Rate and Rhythm: Normal rate and regular rhythm.     Pulses: Normal pulses.     Heart sounds: Normal heart sounds. No murmur heard.   No friction rub. No gallop.  Pulmonary:     Effort: Pulmonary effort is normal. No respiratory distress.     Breath sounds: Normal breath sounds. No wheezing, rhonchi or rales.  Chest:     Chest wall: No tenderness.  Abdominal:     General: Bowel sounds are normal. There is no distension.     Palpations: Abdomen is soft. There is no mass.     Tenderness: There is no abdominal tenderness. There is no right CVA tenderness, left CVA tenderness, guarding or rebound.  Musculoskeletal:        General: No swelling or tenderness. Normal range of motion.     Cervical back: Normal range of motion. No rigidity or tenderness.     Right lower leg: No edema.     Left lower leg: No edema.     Comments: Unsteady gait walks with Rolator   Lymphadenopathy:      Cervical: No cervical adenopathy.  Skin:    General: Skin is warm and dry.     Coloration: Skin is not pale.     Findings: No bruising, erythema, lesion or  rash.  Neurological:     Mental Status: She is alert and oriented to person, place, and time.     Cranial Nerves: No cranial nerve deficit.     Sensory: No sensory deficit.     Motor: No weakness.     Coordination: Coordination normal.     Gait: Gait abnormal.  Psychiatric:        Mood and Affect: Mood normal.        Speech: Speech normal.        Behavior: Behavior normal.        Thought Content: Thought content normal.        Judgment: Judgment normal.    Labs reviewed: Recent Labs    06/02/20 0857 12/08/20 0901 05/25/21 0906  NA 141 141 142  K 4.6 4.6 5.0  CL 106 107 107  CO2 _0 GLUCOSE 109* 105* 108*  BUN _1 CREATININE 0.73 0.73 0.81  CALCIUM 9.7 9.7 10.3  MG  --  2.0  --    Recent Labs    06/02/20 0857 12/08/20 0901 05/25/21 0906  AST _2 ALT _3 BILITOT 0.6 0.5 0.6  PROT 6.5 7.1 7.0   Recent Labs    06/02/20 0857 12/08/20 0901 05/25/21 0906  WBC 6.3 6.3 6.4  NEUTROABS 3,339 3,257 3,578  HGB 13.4 13.9 13.6  HCT 41.4 42.0 41.3  MCV 92.8 91.9 94.3  PLT 260 261 257   Lab Results  Component Value Date   TSH 2.44 05/25/2021   Lab Results  Component Value Date   HGBA1C 5.9 (H) 05/25/2021   Lab Results  Component Value Date   CHOL 156 05/25/2021   HDL 53 05/25/2021   LDLCALC 75 05/25/2021   TRIG 180 (H) 05/25/2021   CHOLHDL 2.9 05/25/2021    Significant Diagnostic Results in last 30 days:  No results found.  Assessment/Plan 1. Essential hypertension B/p well controlled. Continue on metoprolol and furosemide  - CBC with Differential/Platelet; Future - CMP with eGFR([facility]); Future - TSH; Future  2. Hyperlipidemia, unspecified hyperlipidemia type LDL at goal  Continue on pravastatin  - Lipid panel; Future  3. Hypothyroidism, unspecified type Lab Results   Component Value Date   TSH 2.44 05/25/2021  Continue on synthroid 75 mcg tablet on empty stomach   4. Prediabetes Lab Results  Component Value Date   HGBA1C 5.9 (H) 05/25/2021  Continue on low carbo/sweets/drinks  diet  - Hemoglobin A1c; Future  5. Primary insomnia Mind wonders sometimes at night when she takes a nap during the day.POA states sleeps well after she takes her Gabapentin for pain  Ambien previous D/c by PCP due to SE  Melatonin and trazodone ineffective Continue sleep hygiene.  Family/ staff Communication: Reviewed plan of care with patient and POA verbalized understanding   Labs/tests ordered:  - CBC with Differential/Platelet - CMP with eGFR([facility]) - TSH - Hgb A1C - Lipid panel  Next Appointment : 6 months for medical management of chronic issues.Fasting Labs prior to visit.   Sandrea Hughs, NP

## 2021-06-04 ENCOUNTER — Ambulatory Visit
Admission: RE | Admit: 2021-06-04 | Discharge: 2021-06-04 | Disposition: A | Discharge status: Home / Self Care | Payer: PPO | Source: Ambulatory Visit | Attending: Family | Admitting: Family

## 2021-06-04 ENCOUNTER — Other Ambulatory Visit: Payer: Self-pay

## 2021-06-04 DIAGNOSIS — Z1231 Encounter for screening mammogram for malignant neoplasm of breast: Secondary | ICD-10-CM

## 2021-06-04 DIAGNOSIS — L821 Other seborrheic keratosis: Secondary | ICD-10-CM | POA: Diagnosis not present

## 2021-06-04 DIAGNOSIS — Z85828 Personal history of other malignant neoplasm of skin: Secondary | ICD-10-CM | POA: Diagnosis not present

## 2021-06-04 DIAGNOSIS — Z95810 Presence of automatic (implantable) cardiac defibrillator: Secondary | ICD-10-CM | POA: Diagnosis not present

## 2021-06-04 DIAGNOSIS — L570 Actinic keratosis: Secondary | ICD-10-CM | POA: Diagnosis not present

## 2021-06-04 DIAGNOSIS — L820 Inflamed seborrheic keratosis: Secondary | ICD-10-CM | POA: Diagnosis not present

## 2021-06-09 DIAGNOSIS — H26493 Other secondary cataract, bilateral: Secondary | ICD-10-CM | POA: Diagnosis not present

## 2021-06-09 DIAGNOSIS — H43813 Vitreous degeneration, bilateral: Secondary | ICD-10-CM | POA: Diagnosis not present

## 2021-06-09 DIAGNOSIS — D23112 Other benign neoplasm of skin of right lower eyelid, including canthus: Secondary | ICD-10-CM | POA: Diagnosis not present

## 2021-06-09 DIAGNOSIS — H524 Presbyopia: Secondary | ICD-10-CM | POA: Diagnosis not present

## 2021-06-22 DIAGNOSIS — H26492 Other secondary cataract, left eye: Secondary | ICD-10-CM | POA: Diagnosis not present

## 2021-07-08 ENCOUNTER — Other Ambulatory Visit: Payer: Self-pay | Admitting: Family

## 2021-07-09 ENCOUNTER — Ambulatory Visit: Payer: PPO | Admitting: Family

## 2021-07-09 ENCOUNTER — Encounter: Payer: PPO | Admitting: Family

## 2021-07-15 ENCOUNTER — Encounter: Payer: PPO | Admitting: Family

## 2021-07-23 ENCOUNTER — Telehealth: Payer: Self-pay

## 2021-07-23 ENCOUNTER — Other Ambulatory Visit: Payer: Self-pay

## 2021-07-23 ENCOUNTER — Other Ambulatory Visit: Payer: Self-pay | Admitting: Family

## 2021-07-23 ENCOUNTER — Encounter: Payer: Self-pay | Admitting: Family

## 2021-07-23 ENCOUNTER — Ambulatory Visit (INDEPENDENT_AMBULATORY_CARE_PROVIDER_SITE_OTHER): Payer: PPO | Admitting: Family

## 2021-07-23 DIAGNOSIS — Z0000 Encounter for general adult medical examination without abnormal findings: Secondary | ICD-10-CM

## 2021-07-23 NOTE — Progress Notes (Signed)
This service is provided via telemedicine  No vital signs collected/recorded due to the encounter was a telemedicine visit.   Location of patient (ex: home, work):  Home  Patient consents to a telephone visit:  Yes, see encounter dated 07/23/2021  Location of the provider (ex: office, home):  [facility]  Name of any referring provider:  N/A  Names of all persons participating in the telemedicine service and their role in the encounter:  [name], NP, Carroll Kinds, CMA, patient and granddaughter, Marzetta Board  Time spent on call:  10 minutes with medical assistant    Subjective:   [name] is a 87 y.o. female who presents for Medicare Annual (Subsequent) preventive examination.  Review of Systems     Cardiac Risk Factors include: advanced age (>71mn, >>53women);family history of premature cardiovascular disease     Objective:    There were no vitals filed for this visit. There is no height or weight on file to calculate BMI.  Advanced Directives 07/23/2021 05/28/2021 12/11/2020 07/07/2020 06/09/2020 05/08/2020 12/02/2019  Does Patient Have a Medical Advance Directive? - Yes Yes Yes Yes Yes Yes  Type of Advance Directive Out of facility DNR (pink MOST or yellow form) Out of facility DNR (pink MOST or yellow form) Out of facility DNR (pink MOST or yellow form) Out of facility DNR (pink MOST or yellow form) Out of facility DNR (pink MOST or yellow form) Out of facility DNR (pink MOST or yellow form) [facility] will  Does patient want to make changes to medical advance directive? No - Patient declined No - Patient declined No - Patient declined No - Patient declined No - Patient declined No - Patient declined No - Patient declined  Copy of [facility] [place] Chart? - - - - - - Yes - validated most recent copy scanned in chart (See row information)  Would patient like information on creating a medical advance directive? - - - -  - - -  Pre-existing out of facility DNR order (yellow form or pink MOST form) Pink MOST form placed in chart (order not valid for inpatient use) - - - - - -    Current Medications (verified) Outpatient Encounter Medications as of 07/23/2021  Medication Sig   acetaminophen (TYLENOL) 650 MG CR tablet Take 650 mg by mouth every 8 (eight) hours as needed for pain.   Ascorbic Acid (VITAMIN C) 1000 MG tablet Take 500 mg by mouth daily. 1/2 tablet in the morning and 1/2 tablet in the evening.   Calcium Carb-Cholecalciferol 600-800 MG-UNIT TABS Take 1 tablet by mouth every morning.   cholecalciferol (VITAMIN D3) 25 MCG (1000 UT) tablet Take 1,000 Units by mouth every morning.   fexofenadine (ALLEGRA ALLERGY) 180 MG tablet Take 1 tablet (180 mg total) by mouth daily.   furosemide (LASIX) 40 MG tablet Take 40 mg by mouth as needed.   gabapentin (NEURONTIN) 600 MG tablet TAKE 1 TABLET BY MOUTH TWICE A DAY AS NEEDED   ketoconazole (NIZORAL) 2 % cream Apply 1 application topically 2 (two) times daily as needed (for 2 weeks as needed for flares).   metoprolol succinate (TOPROL-XL) 25 MG 24 hr tablet Take 25 mg by mouth daily.    Multiple Vitamins-Minerals (ABC PLUS PO) Take 1 tablet by mouth daily.   Omega-3 1000 MG CAPS Take 1 capsule by mouth daily.   pantoprazole (PROTONIX) 40 MG tablet TAKE 1 TABLET BY MOUTH ONCE DAILY  PARoxetine (PAXIL) 30 MG tablet TAKE 1 TABLET (30 MG TOTAL) BY MOUTH DAILY.   potassium chloride (MICRO-K) 10 MEQ CR capsule TAKE 1 CAPSULE BY MOUTH ONCE DAILY   PRADAXA 75 MG CAPS capsule Take 75 mg by mouth 2 (two) times daily.    pravastatin (PRAVACHOL) 10 MG tablet TAKE 1 TABLET BY MOUTH DAILY.   Probiotic Product (ALIGN) 4 MG CAPS Take 1 capsule (4 mg total) by mouth daily.   [DISCONTINUED] SYNTHROID 75 MCG tablet TAKE 1 TABLET BY MOUTH EVERY MORNING ON AN EMPTY STOMACH   [DISCONTINUED] guaiFENesin (MUCINEX) 600 MG 12 hr tablet Take 600 mg by mouth 2 (two) times daily.   No  facility-administered encounter medications on file as of 07/23/2021.    Allergies (verified) Codeine   History: Past Medical History:  Diagnosis Date   Acid reflux    Arthritis    Atrial fibrillation ([place])    Breast cancer ([place]) 2011   Left Breast Cancer   Cancer [facility]) 2011   Left Breast Cancer   Depression    Hyperlipidemia    Hypertension    Insomnia    Osteoporosis    Pacemaker 2010   WIITH DEFIB   Pelvic fracture ([place]) 2010   Thyroid disease    Past Surgical History:  Procedure Laterality Date   ABDOMINAL HYSTERECTOMY  37 yrs ago   BREAST LUMPECTOMY Left 05/23/10   CARDIAC DEFIBRILLATOR PLACEMENT  2 years ago   CHOLECYSTECTOMY  18 YEARS AGO   ELBOW SURGERY  2006   Family History  Problem Relation Age of Onset   Heart disease Mother    Diabetes Father    Stroke Father    Cancer Sister        lung   Cancer Brother        liver   COPD Brother    Heart attack Brother    Stroke Brother    Heart attack Brother    Social History   Socioeconomic History   Marital status: Widowed    Spouse name: Not on file   Number of children: Not on file   Years of education: Not on file   Highest education level: Not on file  Occupational History   Not on file  Tobacco Use   Smoking status: Never   Smokeless tobacco: Never  Vaping Use   Vaping Use: Never used  Substance and Sexual Activity   Alcohol use: No   Drug use: Never   Sexual activity: Not on file  Other Topics Concern   Not on file  Social History Narrative   Social History      Diet? None       Do you drink/eat things with caffeine? yes      Marital status?             Widowed                        What year were you married? 1946      Do you live in a house, apartment, assisted living, condo, trailer, etc.? house      Is it one or more stories?   2      How many persons live in your home?  3 others       Do you have any pets in your home? (please list)   2 dogs      Highest level of  education completed? Junior high school ?  Current or past profession: N/A      Do you exercise?                   No                    Type & how often?        Advanced Directives      Do you have a living will? Yes       Do you have a DNR form?                                  If not, do you want to discuss one?      Do you have signed POA/HPOA for forms? Yes      Functional Status      Do you have difficulty bathing or dressing yourself?  No       Do you have difficulty preparing food or eating? no      Do you have difficulty managing your medications?  Yes      Do you have difficulty managing your finances? Yes       Do you have difficulty affording your medications?  no      Social Determinants of Health   Financial Resource Strain: Not on file  Food Insecurity: Not on file  Transportation Needs: Not on file  Physical Activity: Not on file  Stress: Not on file  Social Connections: Not on file    Tobacco Counseling Counseling given: Not Answered   Clinical Intake:  Pre-visit preparation completed: No  Pain : No/denies pain     BMI - recorded: 29.97 Nutritional Status: BMI 25 -29 Overweight Nutritional Risks: None  How often do you need to have someone help you when you read instructions, pamphlets, or other written materials from your doctor or pharmacy?: 3 - Sometimes (Daughter assist) What is the last grade level you completed in school?: 3 rd Grade  Diabetic?No   Interpreter Needed?: No  Information entered by :: [employer] In your present state of health, do you have any difficulty performing the following activities: 07/23/2021  Hearing? Y  Vision? N  Difficulty concentrating or making decisions? Y  Comment Remebering  Walking or climbing stairs? Y  Comment walks with a walker  Dressing or bathing? N  Doing errands, shopping? Y  Comment daughter [place] and eating ? Y   Comment Family assist  Using the Toilet? N  In the past six months, have you accidently leaked urine? Y  Comment wears pads  Do you have problems with loss of bowel control? N  Managing your Medications? Y  Managing your Finances? Y  Comment daughter  Housekeeping or managing your Housekeeping? Y  Comment daughter assist  Some recent data might be hidden    Patient Care Team: Deleon Passe, [name], NP as PCP - General (Family Medicine) [name], MD as Consulting Physician (Dermatology) [name], MD as Consulting Physician (Otolaryngology) [name], MD as Referring Physician (Cardiology)  Indicate any recent Medical Services you may have received from other than [facility] providers in the past year (date may be approximate).     Assessment:   This is a routine wellness examination for [name].  Hearing/Vision screen Hearing Screening - Comments:: Patient wears hearing aids. Vision Screening - Comments:: Patient had laser procedure that  improved vision. Patient wears glasses. Patient's last eye exam was early August 2022. Patient sees Dr. Satira Sark  Dietary issues and exercise activities discussed: Current Exercise Habits: Home exercise routine, Type of exercise: Other - see comments, Time (Minutes): 10, Frequency (Times/Week): 3, Weekly Exercise (Minutes/Week): 30, Intensity: Mild   Goals Addressed             This Visit's Progress    Patient Stated   On track    Thankful for the state of health      Patient Stated       Would like to maintain current level of care        Depression Screen [facility] 2/9 Scores 07/23/2021 07/07/2020 12/11/2019 12/02/2019  PHQ - 2 Score 0 - 0 0  Exception Documentation - Other- indicate reason in comment box - -    Fall Risk Fall Risk  07/23/2021 05/28/2021 12/11/2020 07/07/2020 06/09/2020  Falls in the past year? 0 0 0 0 0  Number falls in past yr: 0 0 0 0 0  Injury with Fall? 0 0 0 0 0  Risk for fall due to : No Fall Risks No Fall Risks - - -   Follow up Falls evaluation completed Falls evaluation completed - - -    FALL RISK PREVENTION PERTAINING TO THE HOME:  Any stairs in or around the home? Yes  If so, are there any without handrails? Yes  Home free of loose throw rugs in walkways, pet beds, electrical cords, etc? No  Adequate lighting in your home to reduce risk of falls? Yes   ASSISTIVE DEVICES UTILIZED TO PREVENT FALLS:  Life alert? Yes  Use of a cane, walker or w/c? Yes  Grab bars in the bathroom? Yes  Shower chair or bench in shower? Yes  Elevated toilet seat or a handicapped toilet? Yes   TIMED UP AND GO:  Was the test performed? No .  Length of time to ambulate 10 feet: N/A  sec.   Gait slow and steady with assistive device  Cognitive Function:     6CIT Screen 07/23/2021  What Year? 0 points  What month? 0 points  What time? 0 points  Count back from 20 0 points  Months in reverse 4 points  Repeat phrase 6 points  Total Score 10    Immunizations Immunization History  Administered Date(s) Administered   Fluad Quad(high Dose 65+) 08/01/2019, 08/17/2020   Influenza, High Dose Seasonal PF 07/31/2018   Influenza-Unspecified 06/12/2015   Moderna Sars-Covid-2 Vaccination 03/20/2021   PFIZER(Purple Top)SARS-COV-2 Vaccination 12/16/2019, 01/09/2020, 08/11/2020   Pneumococcal Conjugate-13 12/23/2016   Pneumococcal Polysaccharide-23 10/12/2003   Tdap 12/25/2017   Zoster Recombinat (Shingrix) 06/23/2021    TDAP status: Up to date  Flu Vaccine status: Due, Education has been provided regarding the importance of this vaccine. Advised may receive this vaccine at local pharmacy or Health Dept. Aware to provide a copy of the vaccination record if obtained from local pharmacy or Health Dept. Verbalized acceptance and understanding.  Pneumococcal vaccine status: Completed during today's visit.  Covid-19 vaccine status: Information provided on how to obtain vaccines.   Qualifies for Shingles Vaccine? Yes    Zostavax completed Yes   Shingrix Completed?: Yes  Screening Tests Health Maintenance  Topic Date Due   INFLUENZA VACCINE  06/07/2021   COVID-19 Vaccine (5 - Booster for Pfizer series) 07/21/2021   Zoster Vaccines- Shingrix (2 of 2) 08/18/2021   TETANUS/TDAP  12/26/2027   DEXA SCAN  Completed  HPV VACCINES  Aged Out    Health Maintenance  Health Maintenance Due  Topic Date Due   INFLUENZA VACCINE  06/07/2021   COVID-19 Vaccine (5 - Booster for Pfizer series) 07/21/2021    Colorectal cancer screening: No longer required.   Mammogram status: No longer required due to advance age .  Bone Density status: Ordered  N/A . Pt provided with contact info and advised to call to schedule appt.  Lung Cancer Screening: (Low Dose CT Chest recommended if Age 4-80 years, 30 pack-year currently smoking OR have quit w/in 15years.) does not qualify.   Lung Cancer Screening Referral: No   Additional Screening:  Hepatitis C Screening: does not qualify; Completed No  Vision Screening: Recommended annual ophthalmology exams for early detection of glaucoma and other disorders of the eye. Is the patient up to date with their annual eye exam?  Yes  Who is the provider or what is the name of the office in which the patient attends annual eye exams? [facility]  If pt is not established with a provider, would they like to be referred to a provider to establish care? No .   Dental Screening: Recommended annual dental exams for proper oral hygiene  Community Resource Referral / Chronic Care Management: CRR required this visit?  No   CCM required this visit?  No      Plan:     I have personally reviewed and noted the following in the patient's chart:   Medical and social history Use of alcohol, tobacco or illicit drugs  Current medications and supplements including opioid prescriptions.  Functional ability and status Nutritional status Physical activity Advanced  directives List of other physicians Hospitalizations, surgeries, and ER visits in previous 12 months Vitals Screenings to include cognitive, depression, and falls Referrals and appointments  In addition, I have reviewed and discussed with patient certain preventive protocols, quality metrics, and best practice recommendations. A written personalized care plan for preventive services as well as general preventive health recommendations were provided to patient.     Sandrea Hughs, NP   07/23/2021   Nurse Notes: - Influenza vaccine due.daughter will arrange to get vaccine at the pharmacy or office

## 2021-07-23 NOTE — Telephone Encounter (Signed)
[name], kogan are scheduled for a virtual visit with your provider today.    Just as we do with appointments in the office, we must obtain your consent to participate.  Your consent will be active for this visit and any virtual visit you may have with one of our providers in the next 365 days.    If you have a MyChart account, I can also send a copy of this consent to you electronically.  All virtual visits are billed to your insurance company just like a traditional visit in the office.  As this is a virtual visit, video technology does not allow for your provider to perform a traditional examination.  This may limit your provider's ability to fully assess your condition.  If your provider identifies any concerns that need to be evaluated in person or the need to arrange testing such as labs, EKG, etc, we will make arrangements to do so.    Although advances in technology are sophisticated, we cannot ensure that it will always work on either your end or our end.  If the connection with a video visit is poor, we may have to switch to a telephone visit.  With either a video or telephone visit, we are not always able to ensure that we have a secure connection.   I need to obtain your verbal consent now.   Are you willing to proceed with your visit today?   [name] has provided verbal consent on 07/23/2021 for a virtual visit (video or telephone).   Carroll Kinds, CMA 07/23/2021  4:08 PM

## 2021-07-23 NOTE — Patient Instructions (Signed)
[name] , Thank you for taking time to come for your Medicare Wellness Visit. I appreciate your ongoing commitment to your health goals. Please review the following plan we discussed and let me know if I can assist you in the future.   Screening recommendations/referrals: Colonoscopy N/A Mammogram Up to date  Bone Density Up to date  Recommended yearly ophthalmology/optometry visit for glaucoma screening and checkup Recommended yearly dental visit for hygiene and checkup  Vaccinations: Influenza vaccine Due  Pneumococcal vaccine Up to date  Tdap vaccine Up to date  Shingles vaccine Due for 2 nd dose     Advanced directives: Yes   Conditions/risks identified: Advance age female > 10 yrs,Fx Hx CVA   Next appointment: 1 year    Preventive Care 34 Years and Older, Female Preventive care refers to lifestyle choices and visits with your health care provider that can promote health and wellness. What does preventive care include? A yearly physical exam. This is also called an annual well check. Dental exams once or twice a year. Routine eye exams. Ask your health care provider how often you should have your eyes checked. Personal lifestyle choices, including: Daily care of your teeth and gums. Regular physical activity. Eating a healthy diet. Avoiding tobacco and drug use. Limiting alcohol use. Practicing safe sex. Taking low-dose aspirin every day. Taking vitamin and mineral supplements as recommended by your health care provider. What happens during an annual well check? The services and screenings done by your health care provider during your annual well check will depend on your age, overall health, lifestyle risk factors, and family history of disease. Counseling  Your health care provider may ask you questions about your: Alcohol use. Tobacco use. Drug use. Emotional well-being. Home and relationship well-being. Sexual activity. Eating habits. History of  falls. Memory and ability to understand (cognition). Work and work Statistician. Reproductive health. Screening  You may have the following tests or measurements: Height, weight, and BMI. Blood pressure. Lipid and cholesterol levels. These may be checked every 5 years, or more frequently if you are over 61 years old. Skin check. Lung cancer screening. You may have this screening every year starting at age 53 if you have a 30-pack-year history of smoking and currently smoke or have quit within the past 15 years. Fecal occult blood test (FOBT) of the stool. You may have this test every year starting at age 48. Flexible sigmoidoscopy or colonoscopy. You may have a sigmoidoscopy every 5 years or a colonoscopy every 10 years starting at age 46. Hepatitis C blood test. Hepatitis B blood test. Sexually transmitted disease (STD) testing. Diabetes screening. This is done by checking your blood sugar (glucose) after you have not eaten for a while (fasting). You may have this done every 1-3 years. Bone density scan. This is done to screen for osteoporosis. You may have this done starting at age 50. Mammogram. This may be done every 1-2 years. Talk to your health care provider about how often you should have regular mammograms. Talk with your health care provider about your test results, treatment options, and if necessary, the need for more tests. Vaccines  Your health care provider may recommend certain vaccines, such as: Influenza vaccine. This is recommended every year. Tetanus, diphtheria, and acellular pertussis (Tdap, Td) vaccine. You may need a Td booster every 10 years. Zoster vaccine. You may need this after age 57. Pneumococcal 13-valent conjugate (PCV13) vaccine. One dose is recommended after age 36. Pneumococcal polysaccharide (PPSV23) vaccine. One  dose is recommended after age 67. Talk to your health care provider about which screenings and vaccines you need and how often you need  them. This information is not intended to replace advice given to you by your health care provider. Make sure you discuss any questions you have with your health care provider. Document Released: 11/20/2015 Document Revised: 07/13/2016 Document Reviewed: 08/25/2015 Elsevier Interactive Patient Education  2017 [place] Prevention in the Home Falls can cause injuries. They can happen to people of all ages. There are many things you can do to make your home safe and to help prevent falls. What can I do on the outside of my home? Regularly fix the edges of walkways and driveways and fix any cracks. Remove anything that might make you trip as you walk through a door, such as a raised step or threshold. Trim any bushes or trees on the path to your home. Use bright outdoor lighting. Clear any walking paths of anything that might make someone trip, such as rocks or tools. Regularly check to see if handrails are loose or broken. Make sure that both sides of any steps have handrails. Any raised decks and porches should have guardrails on the edges. Have any leaves, snow, or ice cleared regularly. Use sand or salt on walking paths during winter. Clean up any spills in your garage right away. This includes oil or grease spills. What can I do in the bathroom? Use night lights. Install grab bars by the toilet and in the tub and shower. Do not use towel bars as grab bars. Use non-skid mats or decals in the tub or shower. If you need to sit down in the shower, use a plastic, non-slip stool. Keep the floor dry. Clean up any water that spills on the floor as soon as it happens. Remove soap buildup in the tub or shower regularly. Attach bath mats securely with double-sided non-slip rug tape. Do not have throw rugs and other things on the floor that can make you trip. What can I do in the bedroom? Use night lights. Make sure that you have a light by your bed that is easy to reach. Do not use  any sheets or blankets that are too big for your bed. They should not hang down onto the floor. Have a firm chair that has side arms. You can use this for support while you get dressed. Do not have throw rugs and other things on the floor that can make you trip. What can I do in the kitchen? Clean up any spills right away. Avoid walking on wet floors. Keep items that you use a lot in easy-to-reach places. If you need to reach something above you, use a strong step stool that has a grab bar. Keep electrical cords out of the way. Do not use floor polish or wax that makes floors slippery. If you must use wax, use non-skid floor wax. Do not have throw rugs and other things on the floor that can make you trip. What can I do with my stairs? Do not leave any items on the stairs. Make sure that there are handrails on both sides of the stairs and use them. Fix handrails that are broken or loose. Make sure that handrails are as long as the stairways. Check any carpeting to make sure that it is firmly attached to the stairs. Fix any carpet that is loose or worn. Avoid having throw rugs at the top or bottom of the  stairs. If you do have throw rugs, attach them to the floor with carpet tape. Make sure that you have a light switch at the top of the stairs and the bottom of the stairs. If you do not have them, ask someone to add them for you. What else can I do to help prevent falls? Wear shoes that: Do not have high heels. Have rubber bottoms. Are comfortable and fit you well. Are closed at the toe. Do not wear sandals. If you use a stepladder: Make sure that it is fully opened. Do not climb a closed stepladder. Make sure that both sides of the stepladder are locked into place. Ask someone to hold it for you, if possible. Clearly mark and make sure that you can see: Any grab bars or handrails. First and last steps. Where the edge of each step is. Use tools that help you move around (mobility aids)  if they are needed. These include: Canes. Walkers. Scooters. Crutches. Turn on the lights when you go into a dark area. Replace any light bulbs as soon as they burn out. Set up your furniture so you have a clear path. Avoid moving your furniture around. If any of your floors are uneven, fix them. If there are any pets around you, be aware of where they are. Review your medicines with your doctor. Some medicines can make you feel dizzy. This can increase your chance of falling. Ask your doctor what other things that you can do to help prevent falls. This information is not intended to replace advice given to you by your health care provider. Make sure you discuss any questions you have with your health care provider. Document Released: 08/20/2009 Document Revised: 03/31/2016 Document Reviewed: 11/28/2014 Elsevier Interactive Patient Education  2017 [employer].

## 2021-08-12 ENCOUNTER — Other Ambulatory Visit: Payer: Self-pay | Admitting: Family

## 2021-08-26 ENCOUNTER — Other Ambulatory Visit: Payer: Self-pay | Admitting: Family

## 2021-09-03 DIAGNOSIS — Z95810 Presence of automatic (implantable) cardiac defibrillator: Secondary | ICD-10-CM | POA: Diagnosis not present

## 2021-09-27 ENCOUNTER — Other Ambulatory Visit: Payer: Self-pay | Admitting: Adult Health

## 2021-09-27 NOTE — Telephone Encounter (Signed)
Patient has request refill on medication "Paxil". Medication last refill was 03/26/2021 with 90 tablets and 1 refill. Patient medication is be taken once daily. Patient is due for refills. Medication has High Risk Warnings and Allergy Contraindications. Medication pend and sent to Windell Moulding, NP due to PCP Ngetich, [name], NP being out of office.

## 2021-10-20 ENCOUNTER — Other Ambulatory Visit: Payer: Self-pay | Admitting: Family

## 2021-10-20 DIAGNOSIS — E785 Hyperlipidemia, unspecified: Secondary | ICD-10-CM

## 2021-10-26 ENCOUNTER — Other Ambulatory Visit: Payer: Self-pay

## 2021-10-26 ENCOUNTER — Telehealth (INDEPENDENT_AMBULATORY_CARE_PROVIDER_SITE_OTHER): Payer: PPO | Admitting: Nurse Practitioner

## 2021-10-26 DIAGNOSIS — J069 Acute upper respiratory infection, unspecified: Secondary | ICD-10-CM | POA: Diagnosis not present

## 2021-10-26 NOTE — Progress Notes (Signed)
Careteam: Patient Care Team: Ngetich, [name], NP as PCP - General (Family Medicine) [name], MD as Consulting Physician (Dermatology) [name], MD as Consulting Physician (Otolaryngology) [name], MD as Referring Physician (Cardiology)  Advanced Directive information    Allergies  Allergen Reactions   Codeine Nausea Only    Chief Complaint  Patient presents with   Acute Visit    Patient complains of fatigue of congestion. Nasal congestion seems to be worse. Home COVID test taken today and was negative. Symptoms started last week, but getting worse since Sunday. No fever. She is sneezing. Phlegm that patient coughs up is clear.     HPI: Patient is a 87 y.o. female due to not feeling well.  Reports symptoms started about a week ago and has been getting worse.  She has increase in phlegm which is chronic.  She has had sinus imaging. Recommended saline rinses.  Recommended mucinex twice daily which they are doing.  Granddaughter reports you can hear the congestion.  Clear discharge in her nose.  No headache but pressure in sinuses  No fever COVID negative.  Her grandson is sick.  Granddaughter was also sick.  She also has some sneezing. (Taking allergra daily)  This morning she felt better than yesterday.   She is getting blood work on Friday and has in office on next week.   She got shingles shot on the 10th.   Review of Systems:  Review of Systems  Constitutional:  Positive for malaise/fatigue. Negative for chills, fever and weight loss.  HENT:  Positive for congestion. Negative for tinnitus.   Respiratory:  Positive for cough. Negative for sputum production and shortness of breath.   Cardiovascular:  Negative for chest pain, palpitations and leg swelling.  Gastrointestinal:  Negative for abdominal pain, constipation, diarrhea and heartburn.  Skin: Negative.   Neurological:  Negative for dizziness and headaches.  Psychiatric/Behavioral:   Negative for depression and memory loss. The patient does not have insomnia.    Past Medical History:  Diagnosis Date   Acid reflux    Arthritis    Atrial fibrillation ([place])    Breast cancer ([place]) 2011   Left Breast Cancer   Cancer [facility]) 2011   Left Breast Cancer   Depression    Hyperlipidemia    Hypertension    Insomnia    Osteoporosis    Pacemaker 2010   WIITH DEFIB   Pelvic fracture ([place]) 2010   Thyroid disease    Past Surgical History:  Procedure Laterality Date   ABDOMINAL HYSTERECTOMY  35 yrs ago   BREAST LUMPECTOMY Left 05/23/10   CARDIAC DEFIBRILLATOR PLACEMENT  2 years ago   CHOLECYSTECTOMY  42 YEARS AGO   ELBOW SURGERY  2006   Social History:   reports that she has never smoked. She has never used smokeless tobacco. She reports that she does not drink alcohol and does not use drugs.  Family History  Problem Relation Age of Onset   Heart disease Mother    Diabetes Father    Stroke Father    Cancer Sister        lung   Cancer Brother        liver   COPD Brother    Heart attack Brother    Stroke Brother    Heart attack Brother     Medications: Patient's Medications  New Prescriptions   No medications on file  Previous Medications   ACETAMINOPHEN (TYLENOL) 650 MG CR TABLET  Take 650 mg by mouth every 8 (eight) hours as needed for pain.   ASCORBIC ACID (VITAMIN C) 1000 MG TABLET    Take 500 mg by mouth daily. 1/2 tablet in the morning and 1/2 tablet in the evening.   CALCIUM CARB-CHOLECALCIFEROL 600-800 MG-UNIT TABS    Take 1 tablet by mouth every morning.   CHOLECALCIFEROL (VITAMIN D3) 25 MCG (1000 UT) TABLET    Take 1,000 Units by mouth every morning.   FEXOFENADINE (ALLEGRA ALLERGY) 180 MG TABLET    Take 1 tablet (180 mg total) by mouth daily.   FUROSEMIDE (LASIX) 40 MG TABLET    Take 40 mg by mouth as needed.   GABAPENTIN (NEURONTIN) 600 MG TABLET    TAKE 1 TABLET BY MOUTH TWICE A DAY AS NEEDED   GUAIFENESIN (MUCINEX) 600 MG 12 HR TABLET    Take  600 mg by mouth every other day. Twice a day   KETOCONAZOLE (NIZORAL) 2 % CREAM    Apply 1 application topically 2 (two) times daily as needed (for 2 weeks as needed for flares).   METOPROLOL SUCCINATE (TOPROL-XL) 25 MG 24 HR TABLET    Take 25 mg by mouth daily.    MULTIPLE VITAMINS-MINERALS (ABC PLUS PO)    Take 1 tablet by mouth daily.   OMEGA-3 1000 MG CAPS    Take 1 capsule by mouth daily.   PANTOPRAZOLE (PROTONIX) 40 MG TABLET    TAKE 1 TABLET BY MOUTH ONCE DAILY   PAROXETINE (PAXIL) 30 MG TABLET    TAKE 1 TABLET (30 MG TOTAL) BY MOUTH DAILY.   POTASSIUM CHLORIDE (MICRO-K) 10 MEQ CR CAPSULE    TAKE 1 CAPSULE BY MOUTH ONCE DAILY   PRADAXA 75 MG CAPS CAPSULE    Take 75 mg by mouth 2 (two) times daily.    PRAVASTATIN (PRAVACHOL) 10 MG TABLET    TAKE 1 TABLET BY MOUTH DAILY.   PROBIOTIC PRODUCT (ALIGN) 4 MG CAPS    Take 1 capsule (4 mg total) by mouth daily.   SYNTHROID 75 MCG TABLET    TAKE 1 TABLET BY MOUTH EVERY MORNING ON AN EMPTY STOMACH   UNABLE TO FIND    Nutribiotic nasal spray  Modified Medications   No medications on file  Discontinued Medications   No medications on file    Physical Exam:  There were no vitals filed for this visit. There is no height or weight on file to calculate BMI. Wt Readings from Last 3 Encounters:  05/28/21 174 lb 9.6 oz (79.2 kg)  12/11/20 169 lb 3.2 oz (76.7 kg)  06/09/20 163 lb 12.8 oz (74.3 kg)     Labs reviewed: Basic Metabolic Panel: Recent Labs    12/08/20 0901 05/25/21 0906  NA 141 142  K 4.6 5.0  CL 107 107  CO2 25 30  GLUCOSE 105* 108*  BUN 13 14  CREATININE 0.73 0.81  CALCIUM 9.7 10.3  MG 2.0  --   TSH 3.64 2.44   Liver Function Tests: Recent Labs    12/08/20 0901 05/25/21 0906  AST 24 22  ALT 14 14  BILITOT 0.5 0.6  PROT 7.1 7.0   No results for input(s): LIPASE, AMYLASE in the last 8760 hours. No results for input(s): AMMONIA in the last 8760 hours. CBC: Recent Labs    12/08/20 0901 05/25/21 0906  WBC 6.3  6.4  NEUTROABS 3,257 3,578  HGB 13.9 13.6  HCT 42.0 41.3  MCV 91.9 94.3  PLT 261  257   Lipid Panel: Recent Labs    12/08/20 0901 05/25/21 0906  CHOL 198 156  HDL 51 53  LDLCALC 112* 75  TRIG 230* 180*  CHOLHDL 3.9 2.9   TSH: Recent Labs    12/08/20 0901 05/25/21 0906  TSH 3.64 2.44   A1C: Lab Results  Component Value Date   HGBA1C 5.9 (H) 05/25/2021     Assessment/Plan 1. Viral upper respiratory tract infection Supportive care at this time  Plain nasal saline spray throughout the day as needed May use tylenol 325 mg 2 tablets every 6 hours as needed aches and pains or sore throat humidifier in the home to help with the dry air Mucinex DM by mouth twice daily as needed for cough and congestion with full glass of water  Keep well hydrated Avoid forcefully blowing nose -vit c, vit d and zinc for supplement  -return precautions discussed   Next appt: 12/23/202 Carlos American. Harle Battiest  [facility] [phone]    Virtual Visit via Deloris Ping   I connected with patient on 10/26/21 at 11:30 AM EST by video and verified that I am speaking with the correct person using two identifiers.  Location: Patient: home Provider: [place]    I discussed the limitations, risks, security and privacy concerns of performing an evaluation and management service by telephone and the availability of in person appointments. I also discussed with the patient that there may be a patient responsible charge related to this service. The patient expressed understanding and agreed to proceed.   I discussed the assessment and treatment plan with the patient. The patient was provided an opportunity to ask questions and all were answered. The patient agreed with the plan and demonstrated an understanding of the instructions.   The patient was advised to call back or seek an in-person evaluation if the symptoms worsen or if the condition fails to improve as  anticipated.  I provided 18 minutes of non-face-to-face time during this encounter.  Carlos American. Harle Battiest Avs printed and mailed

## 2021-10-26 NOTE — Progress Notes (Signed)
This service is provided via telemedicine  No vital signs collected/recorded due to the encounter was a telemedicine visit.   Location of patient (ex: home, work):  Home  Patient consents to a telephone visit:  Yes, see encounter dated 07/23/2021  Location of the provider (ex: office, home):  [place]  Name of any referring provider:  Ngetich, Dinah, NP  Names of all persons participating in the telemedicine service and their role in the encounter:  Sherrie Mustache, Nurse Practitioner, Carroll Kinds, CMA, and patient.   Time spent on call:  11 minutes with medical assistant

## 2021-10-29 ENCOUNTER — Other Ambulatory Visit: Payer: Self-pay

## 2021-10-29 ENCOUNTER — Other Ambulatory Visit: Payer: PPO

## 2021-10-29 DIAGNOSIS — R7303 Prediabetes: Secondary | ICD-10-CM

## 2021-10-29 DIAGNOSIS — I10 Essential (primary) hypertension: Secondary | ICD-10-CM | POA: Diagnosis not present

## 2021-10-29 DIAGNOSIS — E785 Hyperlipidemia, unspecified: Secondary | ICD-10-CM | POA: Diagnosis not present

## 2021-10-30 LAB — HEMOGLOBIN A1C
Hgb A1c MFr Bld: 6.1 % of total Hgb — ABNORMAL HIGH (ref <5.7)
Mean Plasma Glucose: 128 mg/dL
eAG (mmol/L): 7.1 mmol/L

## 2021-10-30 LAB — CBC WITH DIFFERENTIAL/PLATELET
Absolute Monocytes: 536 cells/uL (ref 200–950)
Basophils Absolute: 38 cells/uL (ref 0–200)
Basophils Relative: 0.6 %
Eosinophils Absolute: 252 cells/uL (ref 15–500)
Eosinophils Relative: 4 %
HCT: 44.6 % (ref 35.0–45.0)
Hemoglobin: 14.4 g/dL (ref 11.7–15.5)
Lymphs Abs: 2312 cells/uL (ref 850–3900)
MCH: 30.6 pg (ref 27.0–33.0)
MCHC: 32.3 g/dL (ref 32.0–36.0)
MCV: 94.7 fL (ref 80.0–100.0)
MPV: 10.4 fL (ref 7.5–12.5)
Monocytes Relative: 8.5 %
Neutro Abs: 3163 cells/uL (ref 1500–7800)
Neutrophils Relative %: 50.2 %
Platelets: 288 10*3/uL (ref 140–400)
RBC: 4.71 10*6/uL (ref 3.80–5.10)
RDW: 13 % (ref 11.0–15.0)
Total Lymphocyte: 36.7 %
WBC: 6.3 10*3/uL (ref 3.8–10.8)

## 2021-10-30 LAB — COMPLETE METABOLIC PANEL WITH GFR
AG Ratio: 1.4 (calc) (ref 1.0–2.5)
ALT: 14 U/L (ref 6–29)
AST: 25 U/L (ref 10–35)
Albumin: 4.3 g/dL (ref 3.6–5.1)
Alkaline phosphatase (APISO): 46 U/L (ref 37–153)
BUN: 12 mg/dL (ref 7–25)
CO2: 25 mmol/L (ref 20–32)
Calcium: 10.1 mg/dL (ref 8.6–10.4)
Chloride: 104 mmol/L (ref 98–110)
Creat: 0.82 mg/dL (ref 0.60–0.95)
Globulin: 3 g/dL (calc) (ref 1.9–3.7)
Glucose, Bld: 120 mg/dL — ABNORMAL HIGH (ref 65–99)
Potassium: 4.8 mmol/L (ref 3.5–5.3)
Sodium: 142 mmol/L (ref 135–146)
Total Bilirubin: 0.5 mg/dL (ref 0.2–1.2)
Total Protein: 7.3 g/dL (ref 6.1–8.1)
eGFR: 67 mL/min/{1.73_m2} (ref >=60)

## 2021-10-30 LAB — TSH: TSH: 4.64 mIU/L — ABNORMAL HIGH (ref 0.40–4.50)

## 2021-10-30 LAB — LIPID PANEL
Cholesterol: 165 mg/dL (ref <200)
HDL: 48 mg/dL — ABNORMAL LOW (ref >=50)
LDL Cholesterol (Calc): 90 mg/dL (calc)
Non-HDL Cholesterol (Calc): 117 mg/dL (calc) (ref <130)
Total CHOL/HDL Ratio: 3.4 (calc) (ref <5.0)
Triglycerides: 171 mg/dL — ABNORMAL HIGH (ref <150)

## 2021-11-03 ENCOUNTER — Ambulatory Visit (INDEPENDENT_AMBULATORY_CARE_PROVIDER_SITE_OTHER): Payer: PPO | Admitting: Family

## 2021-11-03 ENCOUNTER — Encounter: Payer: Self-pay | Admitting: Family

## 2021-11-03 ENCOUNTER — Other Ambulatory Visit: Payer: Self-pay

## 2021-11-03 VITALS — BP 120/82 | HR 77 | Temp 97.2°F | Resp 16 | Ht 64.0 in | Wt 177.8 lb

## 2021-11-03 DIAGNOSIS — K219 Gastro-esophageal reflux disease without esophagitis: Secondary | ICD-10-CM | POA: Diagnosis not present

## 2021-11-03 DIAGNOSIS — I472 Ventricular tachycardia, unspecified: Secondary | ICD-10-CM | POA: Diagnosis not present

## 2021-11-03 DIAGNOSIS — R7303 Prediabetes: Secondary | ICD-10-CM

## 2021-11-03 DIAGNOSIS — E781 Pure hyperglyceridemia: Secondary | ICD-10-CM | POA: Diagnosis not present

## 2021-11-03 DIAGNOSIS — F411 Generalized anxiety disorder: Secondary | ICD-10-CM | POA: Diagnosis not present

## 2021-11-03 DIAGNOSIS — E039 Hypothyroidism, unspecified: Secondary | ICD-10-CM

## 2021-11-03 DIAGNOSIS — I4891 Unspecified atrial fibrillation: Secondary | ICD-10-CM | POA: Insufficient documentation

## 2021-11-03 DIAGNOSIS — Z95810 Presence of automatic (implantable) cardiac defibrillator: Secondary | ICD-10-CM | POA: Diagnosis not present

## 2021-11-03 DIAGNOSIS — Z7901 Long term (current) use of anticoagulants: Secondary | ICD-10-CM | POA: Insufficient documentation

## 2021-11-03 DIAGNOSIS — H6123 Impacted cerumen, bilateral: Secondary | ICD-10-CM

## 2021-11-03 DIAGNOSIS — I482 Chronic atrial fibrillation, unspecified: Secondary | ICD-10-CM | POA: Diagnosis not present

## 2021-11-03 DIAGNOSIS — I10 Essential (primary) hypertension: Secondary | ICD-10-CM

## 2021-11-03 DIAGNOSIS — I4821 Permanent atrial fibrillation: Secondary | ICD-10-CM | POA: Diagnosis not present

## 2021-11-03 MED ORDER — LEVOTHYROXINE SODIUM 88 MCG PO TABS: 88.0000 ug | ORAL_TABLET | Freq: Every day | ORAL | 0 refills | Status: DC

## 2021-11-03 NOTE — Patient Instructions (Signed)
Increase synthroid from 75 mcg tablet to 88 mcg tablet by mouth daily on empty stomach

## 2021-11-03 NOTE — Progress Notes (Signed)
Provider: [name] FNP-C   Cassandra Harbold, [name], NP  Patient Care Team: Tamantha Saline, [name], NP as PCP - General (Family Medicine) [name], MD as Consulting Physician (Dermatology) [name], MD as Consulting Physician (Otolaryngology) [name], MD as Referring Physician (Cardiology)  Extended Emergency Contact Information Primary Emergency Contact: [name] [place] Phone: [phone] Work Phone: [phone] Relation: Daughter Secondary Emergency Contact: [name] [place] Phone: [phone] Mobile Phone: [phone] Relation: Granddaughter  Code Status:  DNR Goals of care: Advanced Directive information Advanced Directives 11/03/2021  Does Patient Have a Medical Advance Directive? Yes  Type of Advance Directive Out of facility DNR (pink MOST or yellow form)  Does patient want to make changes to medical advance directive? No - Patient declined  Copy of [place] in Chart? -  Would patient like information on creating a medical advance directive? -  Pre-existing out of facility DNR order (yellow form or pink MOST form) -     Chief Complaint  Patient presents with   Medical Management of Chronic Issues    6 month follow-up and discuss need for covid booster or postpone if patient refuses.     HPI:  Pt is a 87 y.o. female seen today for 6 months follow up for medical management of chronic diseases.she is here with her Granddaughter Paula Libra.   Her recent lab work reviewed and discussed today.     Hypertension - No home blood pressure readings for review.on metoprolol 25 mg daily,Furosemide 40 mg tablet she denies any headache,dizziness,vision changes,fatigue,chest tightness,palpitation,chest pain or shortness of breath.      Afib - follows up with Cardiologist at [facility] in [facility] seen today for chronic Afib,/ventricular Tachycardia and ICD  implant remote checked 08/26/2021 with device clinic.  Hyperlipidemia - HDL 48,TRG 171 on pravastatin.Has been eating pop corn.[place] daughter states patient does not eat portion.she will try to reduce amount.   Hypothyroidism - TSH 4.64 currently on levothyroxine 75 mcg tablet daily .takes on empty stomach around 5 am.   Prediabetes - Hgb A 1 C went up from 5.9 to 6.1 glucose 120 snacks on pop corn as above.  Follows up with Ophthalmology Dr.Tanner Legrand [place] for Cataract.  She continues to perform her own ADL's and assist around the house by washing dishes,washing clothes and gives food and water to the animals.Has three dogs ,cats and fish to take care.   Due for 5 th COVID-19 booster vaccine. POA aware will take her to pharmacy to get vaccine.   Walks in the house due to cold weather not walking outside.No fall episode.    Past Medical History:  Diagnosis Date   Acid reflux    Arthritis    Atrial fibrillation ([place])    Breast cancer ([place]) 2011   Left Breast Cancer   Cancer [facility]) 2011   Left Breast Cancer   Depression    Hyperlipidemia    Hypertension    Insomnia    Osteoporosis    Pacemaker 2010   WIITH DEFIB   Pelvic fracture ([place]) 2010   Thyroid disease    Past Surgical History:  Procedure Laterality Date   ABDOMINAL HYSTERECTOMY  35 yrs ago   BREAST LUMPECTOMY Left 05/23/10   CARDIAC DEFIBRILLATOR PLACEMENT  2 years ago   CHOLECYSTECTOMY  50 YEARS AGO   ELBOW SURGERY  2006    Allergies  Allergen Reactions   Codeine Nausea Only  Allergies as of 11/03/2021       Reactions   Codeine Nausea Only        Medication List        Accurate as of November 03, 2021  3:12 PM. If you have any questions, ask your nurse or doctor.          STOP taking these medications    fexofenadine 180 MG tablet Commonly known as: Allegra Allergy Stopped by: Sandrea Hughs, NP   ketoconazole 2 % cream Commonly known as: NIZORAL Stopped by: Sandrea Hughs, NP        TAKE these medications    ABC PLUS PO Take 1 tablet by mouth daily.   acetaminophen 650 MG CR tablet Commonly known as: TYLENOL Take 650 mg by mouth every 8 (eight) hours as needed for pain.   Align 4 MG Caps Take 1 capsule (4 mg total) by mouth daily.   Calcium Carb-Cholecalciferol 600-800 MG-UNIT Tabs Take 1 tablet by mouth every morning.   cholecalciferol 25 MCG (1000 UNIT) tablet Commonly known as: VITAMIN D3 Take 1,000 Units by mouth every morning.   furosemide 40 MG tablet Commonly known as: LASIX Take 40 mg by mouth as needed.   gabapentin 600 MG tablet Commonly known as: NEURONTIN Take 600 mg by mouth at bedtime as needed. What changed: Another medication with the same name was removed. Continue taking this medication, and follow the directions you see here. Changed by: Sandrea Hughs, NP   guaiFENesin 600 MG 12 hr tablet Commonly known as: MUCINEX Take 600 mg by mouth daily. Twice a day   levothyroxine 88 MCG tablet Commonly known as: SYNTHROID Take 1 tablet (88 mcg total) by mouth daily. What changed:  medication strength how much to take when to take this additional instructions Changed by: [name] Prosperity Darrough, NP   metoprolol succinate 25 MG 24 hr tablet Commonly known as: TOPROL-XL Take 25 mg by mouth daily.   Omega-3 1000 MG Caps Take 1 capsule by mouth daily.   pantoprazole 40 MG tablet Commonly known as: PROTONIX TAKE 1 TABLET BY MOUTH ONCE DAILY   PARoxetine 30 MG tablet Commonly known as: PAXIL TAKE 1 TABLET (30 MG TOTAL) BY MOUTH DAILY.   potassium chloride 10 MEQ CR capsule Commonly known as: MICRO-K TAKE 1 CAPSULE BY MOUTH ONCE DAILY   Pradaxa 75 MG Caps capsule Generic drug: dabigatran Take 75 mg by mouth 2 (two) times daily.   pravastatin 10 MG tablet Commonly known as: PRAVACHOL TAKE 1 TABLET BY MOUTH DAILY.   UNABLE TO FIND Nutribiotic nasal spray   vitamin C 1000 MG tablet Take 500 mg by mouth daily. 1/2 tablet in  the morning and 1/2 tablet in the evening.        Review of Systems  Constitutional:  Negative for appetite change, chills, fatigue, fever and unexpected weight change.  HENT:  Positive for hearing loss. Negative for congestion, dental problem, ear discharge, ear pain, facial swelling, nosebleeds, postnasal drip, rhinorrhea, sinus pressure, sinus pain, sneezing, sore throat, tinnitus and trouble swallowing.   Eyes:  Positive for visual disturbance. Negative for pain, discharge, redness and itching.       Follow sup with Dr.Tanner   Respiratory:  Negative for cough, chest tightness, shortness of breath and wheezing.   Cardiovascular:  Negative for chest pain, palpitations and leg swelling.  Gastrointestinal:  Negative for abdominal distention, abdominal pain, blood in stool, constipation, diarrhea, nausea and vomiting.  Endocrine: Negative for  cold intolerance, heat intolerance, polydipsia, polyphagia and polyuria.  Genitourinary:  Negative for difficulty urinating, dysuria, flank pain, frequency and urgency.       Wears penny linner pad   Musculoskeletal:  Positive for arthralgias and gait problem. Negative for back pain, joint swelling, myalgias, neck pain and neck stiffness.  Skin:  Negative for color change, pallor, rash and wound.  Neurological:  Negative for dizziness, syncope, speech difficulty, weakness, light-headedness, numbness and headaches.  Hematological:  Does not bruise/bleed easily.  Psychiatric/Behavioral:  Negative for agitation, behavioral problems, confusion, hallucinations, self-injury, sleep disturbance and suicidal ideas. The patient is not nervous/anxious.        Paxil effective for anxiety    Immunization History  Administered Date(s) Administered   Fluad Quad(high Dose 65+) 08/01/2019, 08/17/2020   Influenza, High Dose Seasonal PF 07/31/2018, 08/21/2021   Influenza-Unspecified 06/12/2015   Moderna Sars-Covid-2 Vaccination 03/20/2021   PFIZER(Purple  Top)SARS-COV-2 Vaccination 12/16/2019, 01/09/2020, 08/11/2020, 03/20/2021   Pneumococcal Conjugate-13 12/23/2016   Pneumococcal Polysaccharide-23 10/12/2003   Tdap 12/25/2017   Zoster Recombinat (Shingrix) 06/23/2021, 10/16/2021   Pertinent  Health Maintenance Due  Topic Date Due   INFLUENZA VACCINE  Completed   DEXA SCAN  Completed   Fall Risk 07/07/2020 12/11/2020 05/28/2021 07/23/2021 11/03/2021  Falls in the past year? 0 0 0 0 0  Was there an injury with Fall? 0 0 0 0 0  Fall Risk Category Calculator 0 0 0 0 0  Fall Risk Category Low Low Low Low Low  Patient Fall Risk Level Low fall risk Low fall risk Low fall risk Low fall risk Low fall risk  Patient at Risk for Falls Due to - - No Fall Risks No Fall Risks No Fall Risks  Fall risk Follow up - - Falls evaluation completed Falls evaluation completed Falls evaluation completed   Functional Status Survey:    Vitals:   11/03/21 1327  BP: 120/82  Pulse: 77  Resp: 16  Temp: (!) 97.2 F (36.2 C)  SpO2: 98%  Weight: 177 lb 12.8 oz (80.6 kg)  Height: $Remove'5\' 4"'VnjWlnq$  (1.626 m)   Body mass index is 30.52 kg/m. Physical Exam Vitals reviewed.  Constitutional:      General: She is not in acute distress.    Appearance: Normal appearance. She is normal weight. She is not ill-appearing or diaphoretic.  HENT:     Head: Normocephalic.     Right Ear: There is impacted cerumen.     Left Ear: There is impacted cerumen.     Nose: Nose normal. No congestion or rhinorrhea.     Mouth/Throat:     Mouth: Mucous membranes are moist.     Pharynx: Oropharynx is clear. No oropharyngeal exudate or posterior oropharyngeal erythema.  Eyes:     General: No scleral icterus.       Right eye: No discharge.        Left eye: No discharge.     Extraocular Movements: Extraocular movements intact.     Conjunctiva/sclera: Conjunctivae normal.     Pupils: Pupils are equal, round, and reactive to light.  Neck:     Vascular: No carotid bruit.  Cardiovascular:      Rate and Rhythm: Normal rate and regular rhythm.     Pulses: Normal pulses.     Heart sounds: Normal heart sounds. No murmur heard.   No friction rub. No gallop.  Pulmonary:     Effort: Pulmonary effort is normal. No respiratory distress.     Breath sounds:  Normal breath sounds. No wheezing, rhonchi or rales.  Chest:     Chest wall: No tenderness.  Abdominal:     General: Bowel sounds are normal. There is no distension.     Palpations: Abdomen is soft. There is no mass.     Tenderness: There is no abdominal tenderness. There is no right CVA tenderness, left CVA tenderness, guarding or rebound.  Musculoskeletal:        General: No swelling or tenderness. Normal range of motion.     Cervical back: Normal range of motion. No rigidity or tenderness.     Right lower leg: No edema.     Left lower leg: No edema.     Comments: Unsteady gait ambulates with Rolator   Lymphadenopathy:     Cervical: No cervical adenopathy.  Skin:    General: Skin is warm and dry.     Coloration: Skin is not pale.     Findings: No bruising, erythema, lesion or rash.  Neurological:     Mental Status: She is alert. Mental status is at baseline.     Cranial Nerves: No cranial nerve deficit.     Sensory: No sensory deficit.     Motor: No weakness.     Coordination: Coordination normal.     Gait: Gait abnormal.     Comments: HOH   Psychiatric:        Mood and Affect: Mood normal.        Speech: Speech normal.        Behavior: Behavior normal.        Thought Content: Thought content normal.        Judgment: Judgment normal.    Labs reviewed: Recent Labs    12/08/20 0901 05/25/21 0906 10/29/21 0904  NA 141 142 142  K 4.6 5.0 4.8  CL 107 107 104  CO2 $Re'25 30 25  'aEd$ GLUCOSE 105* 108* 120*  BUN $Re'13 14 12  'arp$ CREATININE 0.73 0.81 0.82  CALCIUM 9.7 10.3 10.1  MG 2.0  --   --    Recent Labs    12/08/20 0901 05/25/21 0906 10/29/21 0904  AST $Re'24 22 25  'Yom$ ALT $R'14 14 14  'jE$ BILITOT 0.5 0.6 0.5  PROT 7.1 7.0 7.3    Recent Labs    12/08/20 0901 05/25/21 0906 10/29/21 0904  WBC 6.3 6.4 6.3  NEUTROABS 3,257 3,578 3,163  HGB 13.9 13.6 14.4  HCT 42.0 41.3 44.6  MCV 91.9 94.3 94.7  PLT 261 257 288   Lab Results  Component Value Date   TSH 4.64 (H) 10/29/2021   Lab Results  Component Value Date   HGBA1C 6.1 (H) 10/29/2021   Lab Results  Component Value Date   CHOL 165 10/29/2021   HDL 48 (L) 10/29/2021   LDLCALC 90 10/29/2021   TRIG 171 (H) 10/29/2021   CHOLHDL 3.4 10/29/2021    Significant Diagnostic Results in last 30 days:  No results found.  Assessment/Plan  1. Acquired hypothyroidism Lab Results  Component Value Date   TSH 4.64 (H) 10/29/2021  Increase synthroid from 75 mcg tablet to 88 mcg tablet daily   - TSH; Future - TSH; Future - levothyroxine (SYNTHROID) 88 MCG tablet; Take 1 tablet (88 mcg total) by mouth daily.  Dispense: 30 tablet; Refill: 0  2. Essential hypertension B/p well controlled  Continue on metoprolol and Furosemide  - CBC with Differential/Platelet; Future - BMP with eGFR([facility]); Future  3. Hypertriglyceridemia TRG 171 Dietary modification and exercise as tolerated  -  Lipid panel; Future  4. Prediabetes Lab Results  Component Value Date   HGBA1C 6.1 (H) 10/29/2021  Dietary modification and exercise advised   5. Generalized anxiety disorder Stable  - continue on Paroxetine   6. Chronic atrial fibrillation (HCC) HR controlled  Continue on metoprolol succinate   7. Gastroesophageal reflux disease without esophagitis Continue on Protonix  H/H stable  - Magnesium; Future  8. Bilateral impacted cerumen Bilateral TM not visualized due to cerumen impaction Follows up with Audiology POA not sure whether she had ear lavage done in the past.she will contact provider if unable to get done by Audiology.recommended Debrox x 4 days then follow up for ear lavage but prefers ENT referral if not done by Audiology than using debrox.    Family/  staff Communication: Reviewed plan of care with patient and Granddaughter.  Labs/tests ordered:  - CBC with Differential/Platelet; Future - BMP with eGFR([facility]); Future - TSH; Future - TSH; Future - Magnesium; Future - Lipid panel; Future  Next Appointment : medical management of chronic issues.Fasting labs prior to visit.TSH level in 8 weeks.  Sandrea Hughs, NP

## 2021-11-05 MED ORDER — SYNTHROID 88 MCG PO TABS: 88.0000 ug | ORAL_TABLET | Freq: Every day | ORAL | 0 refills | Status: DC

## 2021-11-05 NOTE — Addendum Note (Signed)
Addended byMarlowe Sax C on: 11/05/2021 08:51 AM   Modules accepted: Orders

## 2021-11-19 DIAGNOSIS — H02882 Meibomian gland dysfunction right lower eyelid: Secondary | ICD-10-CM | POA: Diagnosis not present

## 2021-11-19 DIAGNOSIS — H00012 Hordeolum externum right lower eyelid: Secondary | ICD-10-CM | POA: Diagnosis not present

## 2021-12-24 ENCOUNTER — Other Ambulatory Visit: Payer: Self-pay | Admitting: Family

## 2021-12-24 NOTE — Telephone Encounter (Signed)
Patient has request refill on medication "Gabapentin 600mg ". Medication hasnt been filled by PCP. Medication pend and sent to PCP Ngetich, [name], NP .

## 2021-12-27 DIAGNOSIS — E039 Hypothyroidism, unspecified: Secondary | ICD-10-CM | POA: Diagnosis not present

## 2021-12-27 DIAGNOSIS — H9311 Tinnitus, right ear: Secondary | ICD-10-CM | POA: Diagnosis not present

## 2021-12-27 DIAGNOSIS — H903 Sensorineural hearing loss, bilateral: Secondary | ICD-10-CM | POA: Diagnosis not present

## 2021-12-31 ENCOUNTER — Other Ambulatory Visit: Payer: Self-pay

## 2021-12-31 DIAGNOSIS — E039 Hypothyroidism, unspecified: Secondary | ICD-10-CM

## 2022-01-03 ENCOUNTER — Other Ambulatory Visit: Payer: PPO

## 2022-01-03 ENCOUNTER — Other Ambulatory Visit: Payer: Self-pay

## 2022-01-03 DIAGNOSIS — E039 Hypothyroidism, unspecified: Secondary | ICD-10-CM | POA: Diagnosis not present

## 2022-01-04 ENCOUNTER — Other Ambulatory Visit: Payer: Self-pay | Admitting: *Deleted

## 2022-01-04 DIAGNOSIS — E039 Hypothyroidism, unspecified: Secondary | ICD-10-CM

## 2022-01-04 LAB — TSH: TSH: 1.56 mIU/L (ref 0.40–4.50)

## 2022-01-04 MED ORDER — SYNTHROID 88 MCG PO TABS: 88.0000 ug | ORAL_TABLET | Freq: Every day | ORAL | 1 refills | Status: DC

## 2022-01-04 NOTE — Telephone Encounter (Signed)
Patient requested refill

## 2022-01-13 ENCOUNTER — Other Ambulatory Visit: Payer: Self-pay | Admitting: Family

## 2022-02-12 ENCOUNTER — Other Ambulatory Visit: Payer: Self-pay | Admitting: Family

## 2022-02-15 MED ORDER — PAROXETINE HCL 20 MG PO TABS: 20.0000 mg | ORAL_TABLET | Freq: Every day | ORAL | 1 refills | Status: DC

## 2022-02-15 NOTE — Telephone Encounter (Signed)
Recommend tapering down slowly to avoid worsening of depression. ?Paxil 20 mg tablet one by mouth daily x 4 weeks then  ?Take 10 mg tablet one by mouth daily x 4 weeks then  ?Take 10 mg tablet one by mouth every other day x 2 weeks then stop.  ?

## 2022-02-15 NOTE — Telephone Encounter (Signed)
Awesome! Thank you!

## 2022-02-15 NOTE — Telephone Encounter (Signed)
Caregiver, Briant Cedar called and stated that patient only has 30 mg tablets and needs 20 mg called to pharmacy.  ? ?Pended Rx and sent to [facility] for approval.  ?

## 2022-03-06 DIAGNOSIS — Z4502 Encounter for adjustment and management of automatic implantable cardiac defibrillator: Secondary | ICD-10-CM | POA: Diagnosis not present

## 2022-03-06 DIAGNOSIS — I472 Ventricular tachycardia, unspecified: Secondary | ICD-10-CM | POA: Diagnosis not present

## 2022-04-14 ENCOUNTER — Other Ambulatory Visit: Payer: Self-pay | Admitting: Family

## 2022-04-14 NOTE — Telephone Encounter (Signed)
Patient has request refill on medication Paxil '20mg'$ . Last refill dated 02/15/2022. Patient medication has many High Risk Warnings. Medication pend and sent to Sherrie Mustache, NP due to PCP Ngetich, [name], NP being out of office.

## 2022-04-29 ENCOUNTER — Other Ambulatory Visit: Payer: Self-pay | Admitting: Family

## 2022-04-29 DIAGNOSIS — Z1231 Encounter for screening mammogram for malignant neoplasm of breast: Secondary | ICD-10-CM

## 2022-05-13 DIAGNOSIS — I4821 Permanent atrial fibrillation: Secondary | ICD-10-CM | POA: Diagnosis not present

## 2022-05-13 DIAGNOSIS — I10 Essential (primary) hypertension: Secondary | ICD-10-CM | POA: Diagnosis not present

## 2022-05-13 DIAGNOSIS — Z7901 Long term (current) use of anticoagulants: Secondary | ICD-10-CM | POA: Diagnosis not present

## 2022-05-13 DIAGNOSIS — I472 Ventricular tachycardia, unspecified: Secondary | ICD-10-CM | POA: Diagnosis not present

## 2022-05-13 DIAGNOSIS — Z95810 Presence of automatic (implantable) cardiac defibrillator: Secondary | ICD-10-CM | POA: Diagnosis not present

## 2022-05-16 ENCOUNTER — Other Ambulatory Visit: Payer: PPO

## 2022-05-16 DIAGNOSIS — E039 Hypothyroidism, unspecified: Secondary | ICD-10-CM

## 2022-05-16 DIAGNOSIS — E781 Pure hyperglyceridemia: Secondary | ICD-10-CM

## 2022-05-16 DIAGNOSIS — I10 Essential (primary) hypertension: Secondary | ICD-10-CM

## 2022-05-16 DIAGNOSIS — K219 Gastro-esophageal reflux disease without esophagitis: Secondary | ICD-10-CM

## 2022-05-16 DIAGNOSIS — R7309 Other abnormal glucose: Secondary | ICD-10-CM | POA: Diagnosis not present

## 2022-05-19 ENCOUNTER — Ambulatory Visit (INDEPENDENT_AMBULATORY_CARE_PROVIDER_SITE_OTHER): Payer: PPO | Admitting: Family

## 2022-05-19 ENCOUNTER — Encounter: Payer: Self-pay | Admitting: Family

## 2022-05-19 VITALS — BP 118/80 | HR 78 | Temp 97.5°F | Resp 16 | Ht 64.0 in | Wt 174.8 lb

## 2022-05-19 DIAGNOSIS — E6609 Other obesity due to excess calories: Secondary | ICD-10-CM | POA: Diagnosis not present

## 2022-05-19 DIAGNOSIS — I482 Chronic atrial fibrillation, unspecified: Secondary | ICD-10-CM | POA: Diagnosis not present

## 2022-05-19 DIAGNOSIS — Z853 Personal history of malignant neoplasm of breast: Secondary | ICD-10-CM | POA: Diagnosis not present

## 2022-05-19 DIAGNOSIS — R7303 Prediabetes: Secondary | ICD-10-CM | POA: Insufficient documentation

## 2022-05-19 DIAGNOSIS — M25561 Pain in right knee: Secondary | ICD-10-CM | POA: Diagnosis not present

## 2022-05-19 DIAGNOSIS — M25562 Pain in left knee: Secondary | ICD-10-CM

## 2022-05-19 DIAGNOSIS — Z6830 Body mass index (BMI) 30.0-30.9, adult: Secondary | ICD-10-CM | POA: Diagnosis not present

## 2022-05-19 DIAGNOSIS — E039 Hypothyroidism, unspecified: Secondary | ICD-10-CM | POA: Diagnosis not present

## 2022-05-19 DIAGNOSIS — M79671 Pain in right foot: Secondary | ICD-10-CM | POA: Insufficient documentation

## 2022-05-19 DIAGNOSIS — K219 Gastro-esophageal reflux disease without esophagitis: Secondary | ICD-10-CM

## 2022-05-19 DIAGNOSIS — I10 Essential (primary) hypertension: Secondary | ICD-10-CM

## 2022-05-19 DIAGNOSIS — E781 Pure hyperglyceridemia: Secondary | ICD-10-CM | POA: Diagnosis not present

## 2022-05-19 DIAGNOSIS — G8929 Other chronic pain: Secondary | ICD-10-CM | POA: Insufficient documentation

## 2022-05-19 LAB — CBC WITH DIFFERENTIAL/PLATELET
Absolute Monocytes: 491 cells/uL (ref 200–950)
Basophils Absolute: 22 cells/uL (ref 0–200)
Basophils Relative: 0.4 %
Eosinophils Absolute: 178 cells/uL (ref 15–500)
Eosinophils Relative: 3.3 %
HCT: 42.7 % (ref 35.0–45.0)
Hemoglobin: 13.9 g/dL (ref 11.7–15.5)
Lymphs Abs: 1728 cells/uL (ref 850–3900)
MCH: 30.3 pg (ref 27.0–33.0)
MCHC: 32.6 g/dL (ref 32.0–36.0)
MCV: 93.2 fL (ref 80.0–100.0)
MPV: 10.6 fL (ref 7.5–12.5)
Monocytes Relative: 9.1 %
Neutro Abs: 2981 cells/uL (ref 1500–7800)
Neutrophils Relative %: 55.2 %
Platelets: 279 10*3/uL (ref 140–400)
RBC: 4.58 10*6/uL (ref 3.80–5.10)
RDW: 12.8 % (ref 11.0–15.0)
Total Lymphocyte: 32 %
WBC: 5.4 10*3/uL (ref 3.8–10.8)

## 2022-05-19 LAB — LIPID PANEL
Cholesterol: 165 mg/dL (ref <200)
HDL: 54 mg/dL (ref >=50)
LDL Cholesterol (Calc): 86 mg/dL (calc)
Non-HDL Cholesterol (Calc): 111 mg/dL (calc) (ref <130)
Total CHOL/HDL Ratio: 3.1 (calc) (ref <5.0)
Triglycerides: 153 mg/dL — ABNORMAL HIGH (ref <150)

## 2022-05-19 LAB — BASIC METABOLIC PANEL WITH GFR
BUN: 14 mg/dL (ref 7–25)
CO2: 29 mmol/L (ref 20–32)
Calcium: 10.5 mg/dL — ABNORMAL HIGH (ref 8.6–10.4)
Chloride: 106 mmol/L (ref 98–110)
Creat: 0.86 mg/dL (ref 0.60–0.95)
Glucose, Bld: 107 mg/dL — ABNORMAL HIGH (ref 65–99)
Potassium: 5.4 mmol/L — ABNORMAL HIGH (ref 3.5–5.3)
Sodium: 143 mmol/L (ref 135–146)
eGFR: 63 mL/min/{1.73_m2} (ref >=60)

## 2022-05-19 LAB — HEMOGLOBIN A1C
Hgb A1c MFr Bld: 6 % of total Hgb — ABNORMAL HIGH (ref <5.7)
Mean Plasma Glucose: 126 mg/dL
eAG (mmol/L): 7 mmol/L

## 2022-05-19 LAB — TEST AUTHORIZATION

## 2022-05-19 LAB — MAGNESIUM: Magnesium: 2 mg/dL (ref 1.5–2.5)

## 2022-05-19 LAB — TSH: TSH: 0.73 mIU/L (ref 0.40–4.50)

## 2022-05-19 MED ORDER — DICLOFENAC SODIUM 1 % EX GEL: 2.0000 g | Freq: Four times a day (QID) | CUTANEOUS | 3 refills | Status: DC

## 2022-05-19 MED ORDER — POTASSIUM CHLORIDE ER 10 MEQ PO CPCR: 10.0000 meq | ORAL_CAPSULE | Freq: Every day | ORAL | 1 refills | Status: DC | PRN

## 2022-05-19 NOTE — Assessment & Plan Note (Signed)
T1 b N X s/p lumpectomy 05/2010 treated with Arimidex but no radiation -Reports no symptoms

## 2022-05-19 NOTE — Assessment & Plan Note (Signed)
BMI 30 -Has improved with the weight loss of 4 pounds since last visit previous BMI 30.52 -Continue with dietary modification and exercise as tolerated

## 2022-05-19 NOTE — Assessment & Plan Note (Signed)
-   Symptoms controlled -Continue on Protonix

## 2022-05-19 NOTE — Assessment & Plan Note (Signed)
Total cholesterol HDL and LDL are all within normal range triglycerides slightly elevated 153 but has improved compared to previous -Continue with dietary modification and exercise. -Continue on omega-3 if acid daily and pravastatin

## 2022-05-19 NOTE — Assessment & Plan Note (Addendum)
-   Blood pressure well controlled -Creatinine at baseline 0.86 -Continue on Metroprolol succinate -On furosemide as needed for edema

## 2022-05-19 NOTE — Progress Notes (Signed)
Provider: [name] FNP-C   Nastasia Kage, [name], NP  Patient Care Team: Remmy Riffe, [name], NP as PCP - General (Family Medicine) [name], MD as Consulting Physician (Dermatology) [name], MD as Consulting Physician (Otolaryngology) [name], MD as Referring Physician (Cardiology)  Extended Emergency Contact Information Primary Emergency Contact: [name] [place] of [place] Phone: [phone] Work Phone: [phone] Relation: Daughter Secondary Emergency Contact: [name] [place] of [place] Phone: [phone] Mobile Phone: [phone] Relation: Granddaughter  Code Status:  DNR Goals of care: Advanced Directive information    05/19/2022    1:04 PM  Advanced Directives  Does Patient Have a Medical Advance Directive? Yes  Type of Paramedic of [place];Out of facility DNR (pink MOST or yellow form)  Does patient want to make changes to medical advance directive? No - Patient declined  Copy of [place] in Chart? No - copy requested     Chief Complaint  Patient presents with   Medical Management of Chronic Issues    6 month follow up.   Labs     Review recent labs.   Immunizations    Discuss the need for [employer].    HPI:  Pt is a 88 y.o. female seen today for 6 months follow up for medical management of chronic diseases.  She is here with her granddaughter Marzetta Board who provides additional HPI information. She woke up this morning with right foot pain was unable to bear weight so she walked on her side of the foot.  Family member given ice pack to apply and foot and took Tylenol with much improvement.  Pain is located on plantar area.  She describes the area as "broken bone that separated and rejoined".  Foot still painful to palpate.  She declines any injuries to foot. 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  Recent lab results reviewed and discussed during  visit.Lab unremarkable except the following  Hgb A1C slightly improved 6.0 previous 6.1  with glucose 107   Potassium slightly high 5.4 Has taken potassium 10 mEq daily but takes furosemide as needed.Discussed to take her potassium along with the furosemide as needed for abrupt weight gain.  Calcium 10.5 currently on vitamin D 1000 daily and calcium and vitamin D daily.  Granddaughter will verify calcium if vitamin D is included or plain.  Would recommend taking plain calcium without vitamin D supplement.  Total cholesterol and LDL are within normal range but triglycerides have improved but still slightly high 153   She walks with a rollator.  No fall episode or recent hospital admission.  Overall states has been doing well.  She is due for second pneumococcal 23 vaccine.  Prevnar 13 is up-to-date  Also due for COVID-19 booster vaccine #6 aware to get vaccine at the pharmacy.   Past Medical History:  Diagnosis Date   Acid reflux    Arthritis    Atrial fibrillation ([place])    Breast cancer ([place]) 2011   Left Breast Cancer   Cancer [facility]) 2011   Left Breast Cancer   Depression    Hyperlipidemia    Hypertension    Insomnia    Osteoporosis    Pacemaker 2010   WIITH DEFIB   Pelvic fracture ([place]) 2010   Thyroid disease    Past Surgical History:  Procedure Laterality Date   ABDOMINAL HYSTERECTOMY  35 yrs ago   BREAST LUMPECTOMY Left 05/23/10   CARDIAC DEFIBRILLATOR PLACEMENT  2  years ago   CHOLECYSTECTOMY  30 YEARS AGO   ELBOW SURGERY  2006    Allergies  Allergen Reactions   Codeine Nausea Only    Allergies as of 05/19/2022       Reactions   Codeine Nausea Only        Medication List        Accurate as of May 19, 2022  7:56 PM. If you have any questions, ask your nurse or doctor.          STOP taking these medications    guaiFENesin 600 MG 12 hr tablet Commonly known as: MUCINEX Stopped by: Sandrea Hughs, NP       TAKE these medications    ABC PLUS  PO Take 1 tablet by mouth daily.   acetaminophen 650 MG CR tablet Commonly known as: TYLENOL Take 650 mg by mouth every 8 (eight) hours as needed for pain.   Align 4 MG Caps Take 1 capsule (4 mg total) by mouth daily.   Calcium Carb-Cholecalciferol 600-800 MG-UNIT Tabs Take 1 tablet by mouth every morning.   cholecalciferol 25 MCG (1000 UNIT) tablet Commonly known as: VITAMIN D3 Take 1,000 Units by mouth every morning.   diclofenac Sodium 1 % Gel Commonly known as: Voltaren Apply 2 g topically 4 (four) times daily. Started by: Sandrea Hughs, NP   furosemide 40 MG tablet Commonly known as: LASIX Take 40 mg by mouth as needed.   gabapentin 600 MG tablet Commonly known as: NEURONTIN TAKE 1 TABLET BY MOUTH TWICE A DAY AS NEEDED   metoprolol succinate 25 MG 24 hr tablet Commonly known as: TOPROL-XL Take 25 mg by mouth daily.   Omega-3 1000 MG Caps Take 1 capsule by mouth daily.   pantoprazole 40 MG tablet Commonly known as: PROTONIX TAKE 1 TABLET BY MOUTH ONCE DAILY   PARoxetine 20 MG tablet Commonly known as: PAXIL TAKE 1 TABLET (20 MG TOTAL) BY MOUTH DAILY.   potassium chloride 10 MEQ CR capsule Commonly known as: MICRO-K Take 1 capsule (10 mEq total) by mouth daily as needed. Along with furosemide What changed:  when to take this reasons to take this additional instructions Changed by: [name] Herve Haug, NP   Pradaxa 75 MG Caps capsule Generic drug: dabigatran Take 75 mg by mouth 2 (two) times daily.   pravastatin 10 MG tablet Commonly known as: PRAVACHOL TAKE 1 TABLET BY MOUTH DAILY.   Synthroid 88 MCG tablet Generic drug: levothyroxine Take 1 tablet (88 mcg total) by mouth daily before breakfast. On empty stomach   UNABLE TO FIND Nutribiotic nasal spray   vitamin C 1000 MG tablet Take 500 mg by mouth daily. 1/2 tablet in the morning and 1/2 tablet in the evening.        Review of Systems  Constitutional:  Negative for appetite change,  chills, fatigue, fever and unexpected weight change.  HENT:  Positive for hearing loss. Negative for congestion, dental problem, ear discharge, ear pain, facial swelling, nosebleeds, postnasal drip, rhinorrhea, sinus pressure, sinus pain, sneezing, sore throat, tinnitus and trouble swallowing.   Eyes:  Positive for visual disturbance. Negative for pain, discharge, redness and itching.       Corrective lenses in place  Respiratory:  Negative for cough, chest tightness, shortness of breath and wheezing.   Cardiovascular:  Negative for chest pain, palpitations and leg swelling.  Gastrointestinal:  Negative for abdominal distention, abdominal pain, blood in stool, constipation, diarrhea, nausea and vomiting.  Endocrine: Negative  for cold intolerance, heat intolerance, polydipsia, polyphagia and polyuria.  Genitourinary:  Negative for difficulty urinating, dysuria, flank pain, frequency and urgency.  Musculoskeletal:  Positive for arthralgias and gait problem. Negative for back pain, joint swelling, myalgias, neck pain and neck stiffness.       Knee pain -Right foot pain  Skin:  Negative for color change, pallor, rash and wound.  Neurological:  Negative for dizziness, syncope, speech difficulty, weakness, light-headedness, numbness and headaches.  Hematological:  Does not bruise/bleed easily.  Psychiatric/Behavioral:  Negative for agitation, behavioral problems, confusion, hallucinations and sleep disturbance. The patient is not nervous/anxious.     Immunization History  Administered Date(s) Administered   Fluad Quad(high Dose 65+) 08/01/2019, 08/17/2020   Influenza, High Dose Seasonal PF 07/31/2018, 08/21/2021   Influenza-Unspecified 06/12/2015   Moderna Sars-Covid-2 Vaccination 03/20/2021   PFIZER(Purple Top)SARS-COV-2 Vaccination 12/16/2019, 01/09/2020, 08/11/2020, 03/20/2021   Pneumococcal Conjugate-13 12/23/2016   Pneumococcal Polysaccharide-23 10/12/2003   Tdap 12/25/2017   Zoster  Recombinat (Shingrix) 06/23/2021, 10/16/2021   Pertinent  Health Maintenance Due  Topic Date Due   INFLUENZA VACCINE  06/07/2022   DEXA SCAN  Completed      12/11/2020   10:02 AM 05/28/2021   10:12 AM 07/23/2021    3:56 PM 11/03/2021    1:19 PM 05/19/2022    1:01 PM  Fall Risk  Falls in the past year? 0 0 0 0 0  Was there an injury with Fall? 0 0 0 0 0  Fall Risk Category Calculator 0 0 0 0 0  Fall Risk Category Low Low Low Low Low  Patient Fall Risk Level Low fall risk Low fall risk Low fall risk Low fall risk Low fall risk  Patient at Risk for Falls Due to  No Fall Risks No Fall Risks No Fall Risks No Fall Risks  Fall risk Follow up  Falls evaluation completed Falls evaluation completed Falls evaluation completed Falls evaluation completed   Functional Status Survey:    Vitals:   05/19/22 1254  BP: 118/80  Pulse: 78  Resp: 16  Temp: (!) 97.5 F (36.4 C)  SpO2: 97%  Weight: 174 lb 12.8 oz (79.3 kg)  Height: $Remove'5\' 4"'VtWjhnw$  (1.626 m)   Body mass index is 30 kg/m. Physical Exam Vitals reviewed.  Constitutional:      General: She is not in acute distress.    Appearance: Normal appearance. She is obese. She is not ill-appearing or diaphoretic.  HENT:     Head: Normocephalic.     Right Ear: Tympanic membrane, ear canal and external ear normal. There is no impacted cerumen.     Left Ear: Tympanic membrane, ear canal and external ear normal. There is no impacted cerumen.     Ears:     Comments: Bilateral hearing aids in place    Nose: Nose normal. No congestion or rhinorrhea.     Mouth/Throat:     Mouth: Mucous membranes are moist.     Pharynx: Oropharynx is clear. No oropharyngeal exudate or posterior oropharyngeal erythema.  Eyes:     General: No scleral icterus.       Right eye: No discharge.        Left eye: No discharge.     Extraocular Movements: Extraocular movements intact.     Conjunctiva/sclera: Conjunctivae normal.     Pupils: Pupils are equal, round, and reactive to  light.     Comments: Corrective lens implants  Neck:     Vascular: No carotid bruit.  Cardiovascular:  Rate and Rhythm: Normal rate and regular rhythm.     Pulses: Normal pulses.     Heart sounds: Normal heart sounds. No murmur heard.    No friction rub. No gallop.  Pulmonary:     Effort: Pulmonary effort is normal. No respiratory distress.     Breath sounds: Normal breath sounds. No wheezing, rhonchi or rales.  Chest:     Chest wall: No tenderness.  Abdominal:     General: Bowel sounds are normal. There is no distension.     Palpations: Abdomen is soft. There is no mass.     Tenderness: There is no abdominal tenderness. There is no right CVA tenderness, left CVA tenderness, guarding or rebound.  Musculoskeletal:        General: No swelling or tenderness. Normal range of motion.     Cervical back: Normal range of motion. No rigidity or tenderness.     Right lower leg: No edema.     Left lower leg: No edema.  Lymphadenopathy:     Cervical: No cervical adenopathy.  Skin:    General: Skin is warm and dry.     Coloration: Skin is not pale.     Findings: No bruising, erythema, lesion or rash.  Neurological:     Mental Status: She is alert and oriented to person, place, and time.     Cranial Nerves: No cranial nerve deficit.     Sensory: No sensory deficit.     Motor: No weakness.     Coordination: Coordination normal.     Gait: Gait abnormal.  Psychiatric:        Mood and Affect: Mood normal.        Speech: Speech normal.        Behavior: Behavior normal.        Thought Content: Thought content normal.        Judgment: Judgment normal.     Labs reviewed: Recent Labs    05/25/21 0906 10/29/21 0904 05/16/22 1003  NA 142 142 143  K 5.0 4.8 5.4*  CL 107 104 106  CO2 $Re'30 25 29  'ZxH$ GLUCOSE 108* 120* 107*  BUN $Re'14 12 14  'cBc$ CREATININE 0.81 0.82 0.86  CALCIUM 10.3 10.1 10.5*  MG  --   --  2.0   Recent Labs    05/25/21 0906 10/29/21 0904  AST 22 25  ALT 14 14  BILITOT  0.6 0.5  PROT 7.0 7.3   Recent Labs    05/25/21 0906 10/29/21 0904 05/16/22 1003  WBC 6.4 6.3 5.4  NEUTROABS 3,578 3,163 2,981  HGB 13.6 14.4 13.9  HCT 41.3 44.6 42.7  MCV 94.3 94.7 93.2  PLT 257 288 279   Lab Results  Component Value Date   TSH 0.73 05/16/2022   Lab Results  Component Value Date   HGBA1C 6.0 (H) 05/16/2022   Lab Results  Component Value Date   CHOL 165 05/16/2022   HDL 54 05/16/2022   LDLCALC 86 05/16/2022   TRIG 153 (H) 05/16/2022   CHOLHDL 3.1 05/16/2022    Significant Diagnostic Results in last 30 days:  No results found.  Assessment/Plan Problem List Items Addressed This Visit       Cardiovascular and Mediastinum   Essential hypertension - Primary    - Blood pressure well controlled -Creatinine at baseline 0.86 -Continue on Metroprolol succinate -On furosemide as needed for edema      Relevant Orders   COMPLETE METABOLIC PANEL WITH GFR   CBC  with Differential/Platelet   Atrial fibrillation (HCC)    Heart rate well controlled -Continue on Metroprolol succinate for heart rate control -Continue on Pradaxa 75 mg capsule twice daily.  No signs of bleeding reported        Digestive   GERD (gastroesophageal reflux disease)    - Symptoms controlled -Continue on Protonix        Endocrine   Hypothyroidism    Lab Results  Component Value Date   TSH 0.73 05/16/2022  -Continue on Synthroid 88 mcg daily on empty stomach      Relevant Orders   TSH     Other   Prediabetes    Lab Results  Component Value Date   HGBA1C 6.0 (H) 05/16/2022  Recent hemoglobin A1c has improved compared to previous -Continue with dietary modification and exercise as tolerated      Relevant Orders   Hemoglobin A1c   Hypertriglyceridemia    Total cholesterol HDL and LDL are all within normal range triglycerides slightly elevated 153 but has improved compared to previous -Continue with dietary modification and exercise. -Continue on omega-3 if acid  daily and pravastatin      Relevant Orders   Lipid panel   History of left breast cancer    T1 b N X s/p lumpectomy 05/2010 treated with Arimidex but no radiation -Reports no symptoms      Class 1 obesity due to excess calories with serious comorbidity and body mass index (BMI) of 30.0 to 30.9 in adult    - BMI 30.0 with associated comorbidities  hypertension and osteoarthritis of the knees. -Continue dietary modification and exercise.      Chronic pain of both knees    - Chronic decline knee replacement in the past -Continue with over-the-counter Tylenol -Will add Voltaren gel 1 application every 8 hours as needed for pain      Relevant Medications   diclofenac Sodium (VOLTAREN) 1 % GEL   Body mass index (BMI) of 30.0-30.9 in adult    BMI 30 -Has improved with the weight loss of 4 pounds since last visit previous BMI 30.52 -Continue with dietary modification and exercise as tolerated      Acute foot pain, right    Woke up this morning unable to walk on plantar area felt like a broken bone.  Tender on palpation -We will refer to podiatrist for further evaluation -Continue to apply ice pack as needed -Continue with Tylenol for pain as needed      Relevant Orders   Ambulatory referral to Podiatry    Family/ staff Communication: Reviewed plan of care with patient verbalized understanding  Labs/tests ordered:  - CBC with Differential/Platelet - CMP with eGFR([facility]) - TSH - Hgb A1C - Lipid panel  Next Appointment : Return in about 6 months (around 11/19/2022) for medical mangement of chronic issues., Fasting labs in 6 months prior to visit.   Sandrea Hughs, NP

## 2022-05-19 NOTE — Assessment & Plan Note (Signed)
Lab Results  Component Value Date   HGBA1C 6.0 (H) 05/16/2022   Recent hemoglobin A1c has improved compared to previous -Continue with dietary modification and exercise as tolerated

## 2022-05-19 NOTE — Assessment & Plan Note (Signed)
Woke up this morning unable to walk on plantar area felt like a broken bone.  Tender on palpation -We will refer to podiatrist for further evaluation -Continue to apply ice pack as needed -Continue with Tylenol for pain as needed

## 2022-05-19 NOTE — Assessment & Plan Note (Signed)
-   Chronic decline knee replacement in the past -Continue with over-the-counter Tylenol -Will add Voltaren gel 1 application every 8 hours as needed for pain

## 2022-05-19 NOTE — Assessment & Plan Note (Signed)
Heart rate well controlled -Continue on Metroprolol succinate for heart rate control -Continue on Pradaxa 75 mg capsule twice daily.  No signs of bleeding reported

## 2022-05-19 NOTE — Assessment & Plan Note (Signed)
Lab Results  Component Value Date   TSH 0.73 05/16/2022  -Continue on Synthroid 88 mcg daily on empty stomach

## 2022-05-19 NOTE — Assessment & Plan Note (Signed)
-   BMI 30.0 with associated comorbidities  hypertension and osteoarthritis of the knees. -Continue dietary modification and exercise.

## 2022-05-20 NOTE — Telephone Encounter (Signed)
Good Morning Stacy, Please get calcium citrate 600 mg tablet one by mouth daily and continue with Vitamin D 1000 units one by mouth daily. Have a great weekend too ! Tuesday Terlecki,FNP-C

## 2022-05-31 ENCOUNTER — Encounter: Payer: Self-pay | Admitting: Podiatry

## 2022-05-31 ENCOUNTER — Ambulatory Visit (INDEPENDENT_AMBULATORY_CARE_PROVIDER_SITE_OTHER): Payer: PPO

## 2022-05-31 ENCOUNTER — Ambulatory Visit: Payer: PPO | Admitting: Podiatry

## 2022-05-31 DIAGNOSIS — G4700 Insomnia, unspecified: Secondary | ICD-10-CM | POA: Insufficient documentation

## 2022-05-31 DIAGNOSIS — B351 Tinea unguium: Secondary | ICD-10-CM

## 2022-05-31 DIAGNOSIS — M79676 Pain in unspecified toe(s): Secondary | ICD-10-CM

## 2022-05-31 DIAGNOSIS — F32A Depression, unspecified: Secondary | ICD-10-CM | POA: Insufficient documentation

## 2022-05-31 DIAGNOSIS — C801 Malignant (primary) neoplasm, unspecified: Secondary | ICD-10-CM | POA: Insufficient documentation

## 2022-05-31 DIAGNOSIS — H9190 Unspecified hearing loss, unspecified ear: Secondary | ICD-10-CM | POA: Insufficient documentation

## 2022-05-31 DIAGNOSIS — S9031XA Contusion of right foot, initial encounter: Secondary | ICD-10-CM

## 2022-05-31 DIAGNOSIS — M1990 Unspecified osteoarthritis, unspecified site: Secondary | ICD-10-CM | POA: Insufficient documentation

## 2022-06-01 NOTE — Progress Notes (Signed)
Subjective:  Patient ID: Adella Hare, female    DOB: 1926-06-11,  MRN: [id number] HPI Chief Complaint  Patient presents with   Foot Pain    Plantar forefoot right - woke up 05/19/22 in severe pain, no injury previous day, PCP eval-thought was bone spur, really swollen that day, better with swelling today, does wear a lot of flat shoes, tried Tylenol and ice-ice really helped   Nail Problem    Requesting nail trim    New Patient (Initial Visit)    88 y.o. female presents with the above complaint.   ROS: Denies fever chills nausea vomiting muscle aches pains calf pain back pain chest pain shortness of breath.  Past Medical History:  Diagnosis Date   Acid reflux    Arthritis    Atrial fibrillation ([place])    Breast cancer ([place]) 2011   Left Breast Cancer   Cancer [facility]) 2011   Left Breast Cancer   Depression    Hyperlipidemia    Hypertension    Insomnia    Osteoporosis    Pacemaker 2010   WIITH DEFIB   Pelvic fracture ([place]) 2010   Thyroid disease    Past Surgical History:  Procedure Laterality Date   ABDOMINAL HYSTERECTOMY  35 yrs ago   BREAST LUMPECTOMY Left 05/23/10   CARDIAC DEFIBRILLATOR PLACEMENT  2 years ago   CHOLECYSTECTOMY  50 YEARS AGO   ELBOW SURGERY  2006    Current Outpatient Medications:    acetaminophen (TYLENOL) 650 MG CR tablet, Take 650 mg by mouth every 8 (eight) hours as needed for pain., Disp: , Rfl:    Ascorbic Acid (VITAMIN C) 1000 MG tablet, Take 500 mg by mouth daily. 1/2 tablet in the morning and 1/2 tablet in the evening., Disp: , Rfl:    Calcium Carb-Cholecalciferol 600-800 MG-UNIT TABS, Take 1 tablet by mouth every morning., Disp: , Rfl:    cholecalciferol (VITAMIN D3) 25 MCG (1000 UT) tablet, Take 1,000 Units by mouth every morning., Disp: , Rfl:    diclofenac Sodium (VOLTAREN) 1 % GEL, Apply 2 g topically 4 (four) times daily., Disp: 350 g, Rfl: 3   furosemide (LASIX) 40 MG tablet, Take 40 mg by mouth as needed., Disp: , Rfl:    gabapentin  (NEURONTIN) 600 MG tablet, TAKE 1 TABLET BY MOUTH TWICE A DAY AS NEEDED, Disp: 60 tablet, Rfl: 5   metoprolol succinate (TOPROL-XL) 25 MG 24 hr tablet, Take 25 mg by mouth daily. , Disp: , Rfl:    Multiple Vitamins-Minerals (ABC PLUS PO), Take 1 tablet by mouth daily., Disp: , Rfl:    Omega-3 1000 MG CAPS, Take 1 capsule by mouth daily., Disp: , Rfl:    pantoprazole (PROTONIX) 40 MG tablet, TAKE 1 TABLET BY MOUTH ONCE DAILY, Disp: 90 tablet, Rfl: 1   PARoxetine (PAXIL) 20 MG tablet, TAKE 1 TABLET (20 MG TOTAL) BY MOUTH DAILY., Disp: 30 tablet, Rfl: 5   potassium chloride (MICRO-K) 10 MEQ CR capsule, Take 1 capsule (10 mEq total) by mouth daily as needed. Along with furosemide, Disp: 90 capsule, Rfl: 1   PRADAXA 75 MG CAPS capsule, Take 75 mg by mouth 2 (two) times daily. , Disp: , Rfl:    pravastatin (PRAVACHOL) 10 MG tablet, TAKE 1 TABLET BY MOUTH DAILY., Disp: 90 tablet, Rfl: 3   Probiotic Product (ALIGN) 4 MG CAPS, Take 1 capsule (4 mg total) by mouth daily., Disp: 30 capsule, Rfl: 3   SYNTHROID 88 MCG tablet, Take 1 tablet (  88 mcg total) by mouth daily before breakfast. On empty stomach, Disp: 90 tablet, Rfl: 1   UNABLE TO FIND, Nutribiotic nasal spray, Disp: , Rfl:   Allergies  Allergen Reactions   Codeine Nausea Only   Review of Systems Objective:  There were no vitals filed for this visit.  General: Well developed, nourished, in no acute distress, alert and oriented x3   Dermatological: Skin is warm, dry and supple bilateral. Nails x 10 are well maintained; remaining integument appears unremarkable at this time. There are no open sores, no preulcerative lesions, no rash or signs of infection present.  Vascular: Dorsalis Pedis artery and Posterior Tibial artery pedal pulses are 2/4 bilateral with immedate capillary fill time. Pedal hair growth present. No varicosities and no lower extremity edema present bilateral.   Neruologic: Grossly intact via light touch bilateral. Vibratory  intact via tuning fork bilateral. Protective threshold with Semmes Wienstein monofilament intact to all pedal sites bilateral. Patellar and Achilles deep tendon reflexes 2+ bilateral. No Babinski or clonus noted bilateral.   Musculoskeletal: No gross boney pedal deformities bilateral. No pain, crepitus, or limitation noted with foot and ankle range of motion bilateral. Muscular strength 5/5 in all groups tested bilateral.  No reproducible pain on palpation.  Gait: Unassisted, Nonantalgic.    Radiographs:  Radiographs taken today demonstrated moderate to severe osteopenia osteoarthritis but no fractures.  Assessment & Plan:   Assessment: Elongated toenails.  Contusion foot.  Plan: Debridement of nails.  Should her foot start to hurt as it was recently she will notify us immediately and not wait.     Kennedie Pardoe T. [place], [place]

## 2022-06-06 ENCOUNTER — Ambulatory Visit: Payer: PPO

## 2022-06-09 DIAGNOSIS — D2372 Other benign neoplasm of skin of left lower limb, including hip: Secondary | ICD-10-CM | POA: Diagnosis not present

## 2022-06-09 DIAGNOSIS — C4441 Basal cell carcinoma of skin of scalp and neck: Secondary | ICD-10-CM | POA: Diagnosis not present

## 2022-06-09 DIAGNOSIS — L570 Actinic keratosis: Secondary | ICD-10-CM | POA: Diagnosis not present

## 2022-06-09 DIAGNOSIS — L821 Other seborrheic keratosis: Secondary | ICD-10-CM | POA: Diagnosis not present

## 2022-06-09 DIAGNOSIS — L820 Inflamed seborrheic keratosis: Secondary | ICD-10-CM | POA: Diagnosis not present

## 2022-06-09 DIAGNOSIS — D692 Other nonthrombocytopenic purpura: Secondary | ICD-10-CM | POA: Diagnosis not present

## 2022-06-09 DIAGNOSIS — C44319 Basal cell carcinoma of skin of other parts of face: Secondary | ICD-10-CM | POA: Diagnosis not present

## 2022-06-09 DIAGNOSIS — L218 Other seborrheic dermatitis: Secondary | ICD-10-CM | POA: Diagnosis not present

## 2022-06-09 DIAGNOSIS — Z85828 Personal history of other malignant neoplasm of skin: Secondary | ICD-10-CM | POA: Diagnosis not present

## 2022-06-14 DIAGNOSIS — Z4502 Encounter for adjustment and management of automatic implantable cardiac defibrillator: Secondary | ICD-10-CM | POA: Diagnosis not present

## 2022-06-24 NOTE — Telephone Encounter (Signed)
Good Afternoon Stacy, The first bone density was done 03/31/2010. Did she start on fosamax around this time. Thank You  Hoy Fallert

## 2022-06-27 ENCOUNTER — Other Ambulatory Visit: Payer: Self-pay | Admitting: Family

## 2022-06-27 DIAGNOSIS — E039 Hypothyroidism, unspecified: Secondary | ICD-10-CM

## 2022-07-29 ENCOUNTER — Encounter: Payer: PPO | Admitting: Family

## 2022-08-11 ENCOUNTER — Other Ambulatory Visit: Payer: Self-pay | Admitting: Family

## 2022-08-12 ENCOUNTER — Encounter: Payer: Self-pay | Admitting: Family

## 2022-08-12 ENCOUNTER — Ambulatory Visit (INDEPENDENT_AMBULATORY_CARE_PROVIDER_SITE_OTHER): Payer: PPO | Admitting: Family

## 2022-08-12 DIAGNOSIS — Z0000 Encounter for general adult medical examination without abnormal findings: Secondary | ICD-10-CM | POA: Diagnosis not present

## 2022-08-12 NOTE — Progress Notes (Signed)
This service is provided via telemedicine  No vital signs collected/recorded due to the encounter was a telemedicine visit.   Location of patient (ex: home, work):  Home  Patient consents to a telephone visit:  Yes  Location of the provider (ex: office, home):  [employer].   Name of any referring provider:  Biance Moncrief, [name], NP   Names of all persons participating in the telemedicine service and their role in the encounter:  Patient, Granddaughter Stacy Creed,  Heriberto Antigua, RMA, Mira Balon, Vaughn, NP.    Time spent on call: 8 minutes spent on the phone with Medical Assistant.      Subjective:   JAYME MEDNICK is a 88 y.o. female who presents for Medicare Annual (Subsequent) preventive examination.  Review of Systems     Cardiac Risk Factors include: advanced age (>71mn, >>22women);obesity (BMI >30kg/m2);hypertension     Objective:    There were no vitals filed for this visit. There is no height or weight on file to calculate BMI.     08/12/2022    3:41 PM 05/19/2022    1:04 PM 11/03/2021    1:19 PM 11/03/2021   11:55 AM 07/23/2021    3:58 PM 05/28/2021   10:12 AM 12/11/2020   10:02 AM  Advanced Directives  Does Patient Have a Medical Advance Directive? Yes Yes Yes Yes  Yes Yes  Type of AParamedicof AMariannaLiving will;Out of facility DNR (pink MOST or yellow form) [place]osto[place] facility DNR (pink MOST or yellow form) Out of facility DNR (pink MOST or yellow form) Out of facility DNR (pink MOST or yellow form) Out of facility DNR (pink MOST or yellow form) Out of facility DNR (pink MOST or yellow form) Out of facility DNR (pink MOST or yellow form)  Does patient want to make changes to medical advance directive? No - Patient declined No - Patient declined No - Patient declined No - Patient declined No - Patient declined No - Patient declined No - Patient declined  Copy of [place] Chart?  No - copy requested No - copy requested       Pre-existing out of facility DNR order (yellow form or pink MOST form)    Pink MOST form placed in chart (order not valid for inpatient use) Pink MOST form placed in chart (order not valid for inpatient use)      Current Medications (verified) Outpatient Encounter Medications as of 08/12/2022  Medication Sig   acetaminophen (TYLENOL) 650 MG CR tablet Take 650 mg by mouth every 8 (eight) hours as needed for pain.   Ascorbic Acid (VITAMIN C) 1000 MG tablet Take 500 mg by mouth daily. 1/2 tablet in the morning and 1/2 tablet in the evening.   Calcium Carb-Cholecalciferol 600-800 MG-UNIT TABS Take 1 tablet by mouth every morning.   cholecalciferol (VITAMIN D3) 25 MCG (1000 UT) tablet Take 1,000 Units by mouth every morning.   diclofenac Sodium (VOLTAREN) 1 % GEL Apply 2 g topically 4 (four) times daily.   furosemide (LASIX) 40 MG tablet Take 40 mg by mouth as needed.   gabapentin (NEURONTIN) 600 MG tablet TAKE 1 TABLET BY MOUTH TWICE A DAY AS NEEDED   metoprolol succinate (TOPROL-XL) 25 MG 24 hr tablet Take 25 mg by mouth daily.    Multiple Vitamins-Minerals (ABC PLUS PO) Take 1 tablet by mouth daily.   Omega-3 1000 MG CAPS Take 1 capsule by mouth daily.  pantoprazole (PROTONIX) 40 MG tablet TAKE 1 TABLET BY MOUTH ONCE DAILY   PARoxetine (PAXIL) 20 MG tablet TAKE 1 TABLET (20 MG TOTAL) BY MOUTH DAILY.   potassium chloride (MICRO-K) 10 MEQ CR capsule Take 1 capsule (10 mEq total) by mouth daily as needed. Along with furosemide   PRADAXA 75 MG CAPS capsule Take 75 mg by mouth 2 (two) times daily.    pravastatin (PRAVACHOL) 10 MG tablet TAKE 1 TABLET BY MOUTH DAILY.   Probiotic Product (ALIGN) 4 MG CAPS Take 1 capsule (4 mg total) by mouth daily.   SYNTHROID 88 MCG tablet TAKE 1 TABLET (88 MCG TOTAL) BY MOUTH DAILY BEFORE BREAKFAST ON EMPTY STOMACH   UNABLE TO FIND Nutribiotic nasal spray   No facility-administered encounter medications on file as of  08/12/2022.    Allergies (verified) Codeine   History: Past Medical History:  Diagnosis Date   Acid reflux    Arthritis    Atrial fibrillation ([place])    Breast cancer ([place]) 2011   Left Breast Cancer   Cancer [facility]) 2011   Left Breast Cancer   Depression    Hyperlipidemia    Hypertension    Insomnia    Osteoporosis    Pacemaker 2010   WIITH DEFIB   Pelvic fracture ([place]) 2010   Thyroid disease    Past Surgical History:  Procedure Laterality Date   ABDOMINAL HYSTERECTOMY  35 yrs ago   BREAST LUMPECTOMY Left 05/23/10   CARDIAC DEFIBRILLATOR PLACEMENT  2 years ago   CHOLECYSTECTOMY  16 YEARS AGO   ELBOW SURGERY  2006   Family History  Problem Relation Age of Onset   Heart disease Mother    Diabetes Father    Stroke Father    Cancer Sister        lung   Cancer Brother        liver   COPD Brother    Heart attack Brother    Stroke Brother    Heart attack Brother    Social History   Socioeconomic History   Marital status: Widowed    Spouse name: Not on file   Number of children: Not on file   Years of education: Not on file   Highest education level: Not on file  Occupational History   Not on file  Tobacco Use   Smoking status: Never   Smokeless tobacco: Never  Vaping Use   Vaping Use: Never used  Substance and Sexual Activity   Alcohol use: No   Drug use: Never   Sexual activity: Not on file  Other Topics Concern   Not on file  Social History Narrative   Social History      Diet? None       Do you drink/eat things with caffeine? yes      Marital status?             Widowed                        What year were you married? 1946      Do you live in a house, apartment, assisted living, condo, trailer, etc.? house      Is it one or more stories?   2      How many persons live in your home?  3 others       Do you have any pets in your home? (please list)   2 dogs  Highest level of education completed? Junior high school ?      Current or  past profession: N/A      Do you exercise?                   No                    Type & how often?        Advanced Directives      Do you have a living will? Yes       Do you have a DNR form?                                  If not, do you want to discuss one?      Do you have signed POA/HPOA for forms? Yes      Functional Status      Do you have difficulty bathing or dressing yourself?  No       Do you have difficulty preparing food or eating? no      Do you have difficulty managing your medications?  Yes      Do you have difficulty managing your finances? Yes       Do you have difficulty affording your medications?  no      Social Determinants of Health   Financial Resource Strain: Not on file  Food Insecurity: Not on file  Transportation Needs: Not on file  Physical Activity: Not on file  Stress: Not on file  Social Connections: Not on file    Tobacco Counseling Counseling given: Not Answered   Clinical Intake:  Pre-visit preparation completed: No  Pain : No/denies pain     BMI - recorded: 30 Nutritional Status: BMI > 30  Obese Nutritional Risks: None Diabetes: No  How often do you need to have someone help you when you read instructions, pamphlets, or other written materials from your doctor or pharmacy?: 1 - Never What is the last grade level you completed in school?: 3 rd grade  Diabetic?No          Activities of Daily Living    08/12/2022    3:52 PM  In your present state of health, do you have any difficulty performing the following activities:  Hearing? 1  Vision? 0  Difficulty concentrating or making decisions? 0  Walking or climbing stairs? 1  Comment walks with a walker  Dressing or bathing? 0  Doing errands, shopping? 1  Comment daughter Physiological scientist and eating ? Y  Comment daughter assist  Using the Toilet? N  In the past six months, have you accidently leaked urine? N  Do you have problems with loss of bowel  control? N  Managing your Medications? Y  Comment daughter assist  Managing your Finances? Y  Comment daughter  Housekeeping or managing your Housekeeping? Y  Comment daughter assist    Patient Care Team: Cadince Hilscher, [name], NP as PCP - General (Family Medicine) [name], MD as Consulting Physician (Dermatology) [name], MD as Consulting Physician (Otolaryngology) [name], MD as Referring Physician (Cardiology)  Indicate any recent Medical Services you may have received from other than [facility] providers in the past year (date may be approximate).     Assessment:   This is a routine wellness examination for Tamy.  Hearing/Vision screen Hearing Screening - Comments:: Some hearing concerns. Patient wears  hearing-aids.  Vision Screening - Comments:: No vision concerns. Patient has prescription glasses, but barely uses them. Patient last eye exam Fall 2022.  Dietary issues and exercise activities discussed: Current Exercise Habits: Home exercise routine, Type of exercise: walking, Time (Minutes): 10, Frequency (Times/Week): 3, Weekly Exercise (Minutes/Week): 30, Intensity: Mild, Exercise limited by: orthopedic condition(s) (unsteady gait)   Goals Addressed             This Visit's Progress    Patient Stated   On track    Thankful for the state of health      Patient Stated   On track    Would like to maintain current level of care      Patient Stated       Live long to see [place] graduate from high school        Depression Screen    08/12/2022    3:34 PM 07/23/2021    3:53 PM 07/07/2020    1:06 PM 12/11/2019    1:27 PM 12/02/2019    4:04 PM  PHQ 2/9 Scores  PHQ - 2 Score 0 0  0 0  Exception Documentation   Other- indicate reason in comment box      Fall Risk    08/12/2022    3:34 PM 05/19/2022    1:01 PM 11/03/2021    1:19 PM 07/23/2021    3:56 PM 05/28/2021   10:12 AM  [place] in the past year? 0 0 0 0 0  Number falls in past yr: 0 0 0 0  0  Injury with Fall? 0 0 0 0 0  Risk for fall due to : No Fall Risks No Fall Risks No Fall Risks No Fall Risks No Fall Risks  Follow up Falls evaluation completed Falls evaluation completed Falls evaluation completed Falls evaluation completed Falls evaluation completed    [place]:  Any stairs in or around the home? Yes  If so, are there any without handrails? No  Home free of loose throw rugs in walkways, pet beds, electrical cords, etc? No  Adequate lighting in your home to reduce risk of falls? Yes   ASSISTIVE DEVICES UTILIZED TO PREVENT FALLS:  Life alert? Yes  Use of a cane, walker or w/c? No  Grab bars in the bathroom? No  Shower chair or bench in shower? Yes  Elevated toilet seat or a handicapped toilet? Yes   TIMED UP AND GO:  Was the test performed? No .  Length of time to ambulate 10 feet: N/A  sec.   Gait slow and steady with assistive device  Cognitive Function:        08/12/2022    3:35 PM 07/23/2021    3:59 PM  6CIT Screen  What Year? 4 points 0 points  What month? 0 points 0 points  What time? 0 points 0 points  Count back from 20 4 points 0 points  Months in reverse 4 points 4 points  Repeat phrase 4 points 6 points  Total Score 16 points 10 points    Immunizations Immunization History  Administered Date(s) Administered   Fluad Quad(high Dose 65+) 08/01/2019, 08/17/2020   Influenza, High Dose Seasonal PF 07/31/2018, 08/21/2021   Influenza-Unspecified 06/12/2015   Moderna Sars-Covid-2 Vaccination 03/20/2021   PFIZER(Purple Top)SARS-COV-2 Vaccination 12/16/2019, 01/09/2020, 08/11/2020, 03/20/2021   Pneumococcal Conjugate-13 12/23/2016   Pneumococcal Polysaccharide-23 10/12/2003   Tdap 12/25/2017   Zoster Recombinat (Shingrix) 06/23/2021, 10/16/2021  TDAP status: Up to date  Flu Vaccine status: Due, Education has been provided regarding the importance of this vaccine. Advised may receive this vaccine at  local pharmacy or Health Dept. Aware to provide a copy of the vaccination record if obtained from local pharmacy or Health Dept. Verbalized acceptance and understanding.  Pneumococcal vaccine status: Up to date  Covid-19 vaccine status: Information provided on how to obtain vaccines.   Qualifies for Shingles Vaccine? Yes   Zostavax completed Yes   Shingrix Completed?: Yes  Screening Tests Health Maintenance  Topic Date Due   COVID-19 Vaccine (6 - Pfizer risk series) 05/15/2021   INFLUENZA VACCINE  06/07/2022   TETANUS/TDAP  12/26/2027   Pneumonia Vaccine 15+ Years old  Completed   DEXA SCAN  Completed   Zoster Vaccines- Shingrix  Completed   HPV VACCINES  Aged Out    Health Maintenance  Health Maintenance Due  Topic Date Due   COVID-19 Vaccine (6 - Pfizer risk series) 05/15/2021   INFLUENZA VACCINE  06/07/2022    Colorectal cancer screening: No longer required.   Mammogram status: No longer required due to advance age .  Bone Density status: Completed 03/31/2010. Results reflect: Bone density results: OSTEOPENIA. Repeat every 5 years.  Lung Cancer Screening: (Low Dose CT Chest recommended if Age 54-80 years, 30 pack-year currently smoking OR have quit w/in 15years.) does not qualify.   Lung Cancer Screening Referral: No   Additional Screening:  Hepatitis C Screening: does not qualify; Completed no   Vision Screening: Recommended annual ophthalmology exams for early detection of glaucoma and other disorders of the eye. Is the patient up to date with their annual eye exam?  Yes  Who is the provider or what is the name of the office in which the patient attends annual eye exams? Has upcoming appointment.  If pt is not established with a provider, would they like to be referred to a provider to establish care? No .   Dental Screening: Recommended annual dental exams for proper oral hygiene  Community Resource Referral / Chronic Care Management: CRR required this visit?   No   CCM required this visit?  No     Plan:     I have personally reviewed and noted the following in the patient's chart:   Medical and social history Use of alcohol, tobacco or illicit drugs  Current medications and supplements including opioid prescriptions. Patient is not currently taking opioid prescriptions. Functional ability and status Nutritional status Physical activity Advanced directives List of other physicians Hospitalizations, surgeries, and ER visits in previous 12 months Vitals Screenings to include cognitive, depression, and falls Referrals and appointments  In addition, I have reviewed and discussed with patient certain preventive protocols, quality metrics, and best practice recommendations. A written personalized care plan for preventive services as well as general preventive health recommendations were provided to patient.     Sandrea Hughs, NP   08/12/2022   Nurse Notes: Due for COVID-19 and Influenza vaccine.will get vaccine at [facility] pharmacy.

## 2022-08-12 NOTE — Patient Instructions (Signed)
[name] , Thank you for taking time to come for your Medicare Wellness Visit. I appreciate your ongoing commitment to your health goals. Please review the following plan we discussed and let me know if I can assist you in the future.   Screening recommendations/referrals: Colonoscopy N/A  Mammogram N/A  Bone Density : Up to date  Recommended yearly ophthalmology/optometry visit for glaucoma screening and checkup Recommended yearly dental visit for hygiene and checkup  Vaccinations: Influenza vaccine- due annually in September/October Pneumococcal vaccine : Up to date  Tdap vaccine : Up to date  Shingles vaccine : Up to date     Advanced directives: Yes   Conditions/risks identified: Cardiac Risk Factors include: advanced age (>36mn, >>60women);obesity (BMI >30kg/m2);hypertension  Next appointment: 1 year    Preventive Care 647Years and Older, Female Preventive care refers to lifestyle choices and visits with your health care provider that can promote health and wellness. What does preventive care include? A yearly physical exam. This is also called an annual well check. Dental exams once or twice a year. Routine eye exams. Ask your health care provider how often you should have your eyes checked. Personal lifestyle choices, including: Daily care of your teeth and gums. Regular physical activity. Eating a healthy diet. Avoiding tobacco and drug use. Limiting alcohol use. Practicing safe sex. Taking low-dose aspirin every day. Taking vitamin and mineral supplements as recommended by your health care provider. What happens during an annual well check? The services and screenings done by your health care provider during your annual well check will depend on your age, overall health, lifestyle risk factors, and family history of disease. Counseling  Your health care provider may ask you questions about your: Alcohol use. Tobacco use. Drug use. Emotional well-being. Home  and relationship well-being. Sexual activity. Eating habits. History of falls. Memory and ability to understand (cognition). Work and work eStatistician Reproductive health. Screening  You may have the following tests or measurements: Height, weight, and BMI. Blood pressure. Lipid and cholesterol levels. These may be checked every 5 years, or more frequently if you are over 52years old. Skin check. Lung cancer screening. You may have this screening every year starting at age 701if you have a 30-pack-year history of smoking and currently smoke or have quit within the past 15 years. Fecal occult blood test (FOBT) of the stool. You may have this test every year starting at age 86 Flexible sigmoidoscopy or colonoscopy. You may have a sigmoidoscopy every 5 years or a colonoscopy every 10 years starting at age 86 Hepatitis C blood test. Hepatitis B blood test. Sexually transmitted disease (STD) testing. Diabetes screening. This is done by checking your blood sugar (glucose) after you have not eaten for a while (fasting). You may have this done every 1-3 years. Bone density scan. This is done to screen for osteoporosis. You may have this done starting at age 86 Mammogram. This may be done every 1-2 years. Talk to your health care provider about how often you should have regular mammograms. Talk with your health care provider about your test results, treatment options, and if necessary, the need for more tests. Vaccines  Your health care provider may recommend certain vaccines, such as: Influenza vaccine. This is recommended every year. Tetanus, diphtheria, and acellular pertussis (Tdap, Td) vaccine. You may need a Td booster every 10 years. Zoster vaccine. You may need this after age 86 Pneumococcal 13-valent conjugate (PCV13) vaccine. One dose is recommended after age 86  Pneumococcal polysaccharide (PPSV23) vaccine. One dose is recommended after age 51. Talk to your health care provider  about which screenings and vaccines you need and how often you need them. This information is not intended to replace advice given to you by your health care provider. Make sure you discuss any questions you have with your health care provider. Document Released: 11/20/2015 Document Revised: 07/13/2016 Document Reviewed: 08/25/2015 Elsevier Interactive Patient Education  2017 [place] Prevention in the Home Falls can cause injuries. They can happen to people of all ages. There are many things you can do to make your home safe and to help prevent falls. What can I do on the outside of my home? Regularly fix the edges of walkways and driveways and fix any cracks. Remove anything that might make you trip as you walk through a door, such as a raised step or threshold. Trim any bushes or trees on the path to your home. Use bright outdoor lighting. Clear any walking paths of anything that might make someone trip, such as rocks or tools. Regularly check to see if handrails are loose or broken. Make sure that both sides of any steps have handrails. Any raised decks and porches should have guardrails on the edges. Have any leaves, snow, or ice cleared regularly. Use sand or salt on walking paths during winter. Clean up any spills in your garage right away. This includes oil or grease spills. What can I do in the bathroom? Use night lights. Install grab bars by the toilet and in the tub and shower. Do not use towel bars as grab bars. Use non-skid mats or decals in the tub or shower. If you need to sit down in the shower, use a plastic, non-slip stool. Keep the floor dry. Clean up any water that spills on the floor as soon as it happens. Remove soap buildup in the tub or shower regularly. Attach bath mats securely with double-sided non-slip rug tape. Do not have throw rugs and other things on the floor that can make you trip. What can I do in the bedroom? Use night lights. Make sure  that you have a light by your bed that is easy to reach. Do not use any sheets or blankets that are too big for your bed. They should not hang down onto the floor. Have a firm chair that has side arms. You can use this for support while you get dressed. Do not have throw rugs and other things on the floor that can make you trip. What can I do in the kitchen? Clean up any spills right away. Avoid walking on wet floors. Keep items that you use a lot in easy-to-reach places. If you need to reach something above you, use a strong step stool that has a grab bar. Keep electrical cords out of the way. Do not use floor polish or wax that makes floors slippery. If you must use wax, use non-skid floor wax. Do not have throw rugs and other things on the floor that can make you trip. What can I do with my stairs? Do not leave any items on the stairs. Make sure that there are handrails on both sides of the stairs and use them. Fix handrails that are broken or loose. Make sure that handrails are as long as the stairways. Check any carpeting to make sure that it is firmly attached to the stairs. Fix any carpet that is loose or worn. Avoid having throw rugs at the  top or bottom of the stairs. If you do have throw rugs, attach them to the floor with carpet tape. Make sure that you have a light switch at the top of the stairs and the bottom of the stairs. If you do not have them, ask someone to add them for you. What else can I do to help prevent falls? Wear shoes that: Do not have high heels. Have rubber bottoms. Are comfortable and fit you well. Are closed at the toe. Do not wear sandals. If you use a stepladder: Make sure that it is fully opened. Do not climb a closed stepladder. Make sure that both sides of the stepladder are locked into place. Ask someone to hold it for you, if possible. Clearly mark and make sure that you can see: Any grab bars or handrails. First and last steps. Where the edge of  each step is. Use tools that help you move around (mobility aids) if they are needed. These include: Canes. Walkers. Scooters. Crutches. Turn on the lights when you go into a dark area. Replace any light bulbs as soon as they burn out. Set up your furniture so you have a clear path. Avoid moving your furniture around. If any of your floors are uneven, fix them. If there are any pets around you, be aware of where they are. Review your medicines with your doctor. Some medicines can make you feel dizzy. This can increase your chance of falling. Ask your doctor what other things that you can do to help prevent falls. This information is not intended to replace advice given to you by your health care provider. Make sure you discuss any questions you have with your health care provider. Document Released: 08/20/2009 Document Revised: 03/31/2016 Document Reviewed: 11/28/2014 Elsevier Interactive Patient Education  2017 [employer].

## 2022-09-02 DIAGNOSIS — Z4502 Encounter for adjustment and management of automatic implantable cardiac defibrillator: Secondary | ICD-10-CM | POA: Diagnosis not present

## 2022-09-02 DIAGNOSIS — I472 Ventricular tachycardia, unspecified: Secondary | ICD-10-CM | POA: Diagnosis not present

## 2022-10-06 ENCOUNTER — Other Ambulatory Visit: Payer: Self-pay | Admitting: Family

## 2022-10-06 ENCOUNTER — Other Ambulatory Visit: Payer: Self-pay | Admitting: Nurse Practitioner

## 2022-10-06 DIAGNOSIS — E785 Hyperlipidemia, unspecified: Secondary | ICD-10-CM

## 2022-10-07 NOTE — Telephone Encounter (Signed)
High risk or very high risk warning populated when attempting to refill medication. RX request sent to PCP for review and approval if warranted.   

## 2022-10-24 ENCOUNTER — Other Ambulatory Visit: Payer: Self-pay | Admitting: Family

## 2022-11-14 ENCOUNTER — Other Ambulatory Visit: Payer: PPO

## 2022-11-14 DIAGNOSIS — E039 Hypothyroidism, unspecified: Secondary | ICD-10-CM | POA: Diagnosis not present

## 2022-11-14 DIAGNOSIS — R7303 Prediabetes: Secondary | ICD-10-CM | POA: Diagnosis not present

## 2022-11-14 DIAGNOSIS — E781 Pure hyperglyceridemia: Secondary | ICD-10-CM

## 2022-11-14 DIAGNOSIS — I10 Essential (primary) hypertension: Secondary | ICD-10-CM | POA: Diagnosis not present

## 2022-11-15 LAB — COMPLETE METABOLIC PANEL WITH GFR
AG Ratio: 1.7 (calc) (ref 1.0–2.5)
ALT: 14 U/L (ref 6–29)
AST: 24 U/L (ref 10–35)
Albumin: 4.2 g/dL (ref 3.6–5.1)
Alkaline phosphatase (APISO): 41 U/L (ref 37–153)
BUN: 16 mg/dL (ref 7–25)
CO2: 27 mmol/L (ref 20–32)
Calcium: 9.8 mg/dL (ref 8.6–10.4)
Chloride: 108 mmol/L (ref 98–110)
Creat: 0.73 mg/dL (ref 0.60–0.95)
Globulin: 2.5 g/dL (calc) (ref 1.9–3.7)
Glucose, Bld: 108 mg/dL — ABNORMAL HIGH (ref 65–99)
Potassium: 4.5 mmol/L (ref 3.5–5.3)
Sodium: 143 mmol/L (ref 135–146)
Total Bilirubin: 0.5 mg/dL (ref 0.2–1.2)
Total Protein: 6.7 g/dL (ref 6.1–8.1)
eGFR: 76 mL/min/{1.73_m2} (ref >=60)

## 2022-11-15 LAB — CBC WITH DIFFERENTIAL/PLATELET
Absolute Monocytes: 504 cells/uL (ref 200–950)
Basophils Absolute: 38 cells/uL (ref 0–200)
Basophils Relative: 0.6 %
Eosinophils Absolute: 202 cells/uL (ref 15–500)
Eosinophils Relative: 3.2 %
HCT: 40 % (ref 35.0–45.0)
Hemoglobin: 13.3 g/dL (ref 11.7–15.5)
Lymphs Abs: 1531 cells/uL (ref 850–3900)
MCH: 30.1 pg (ref 27.0–33.0)
MCHC: 33.3 g/dL (ref 32.0–36.0)
MCV: 90.5 fL (ref 80.0–100.0)
MPV: 10.3 fL (ref 7.5–12.5)
Monocytes Relative: 8 %
Neutro Abs: 4026 cells/uL (ref 1500–7800)
Neutrophils Relative %: 63.9 %
Platelets: 256 10*3/uL (ref 140–400)
RBC: 4.42 10*6/uL (ref 3.80–5.10)
RDW: 13.3 % (ref 11.0–15.0)
Total Lymphocyte: 24.3 %
WBC: 6.3 10*3/uL (ref 3.8–10.8)

## 2022-11-15 LAB — HEMOGLOBIN A1C
Hgb A1c MFr Bld: 6.2 % of total Hgb — ABNORMAL HIGH (ref <5.7)
Mean Plasma Glucose: 131 mg/dL
eAG (mmol/L): 7.3 mmol/L

## 2022-11-15 LAB — LIPID PANEL
Cholesterol: 155 mg/dL (ref <200)
HDL: 55 mg/dL (ref >=50)
LDL Cholesterol (Calc): 77 mg/dL (calc)
Non-HDL Cholesterol (Calc): 100 mg/dL (calc) (ref <130)
Total CHOL/HDL Ratio: 2.8 (calc) (ref <5.0)
Triglycerides: 124 mg/dL (ref <150)

## 2022-11-15 LAB — TSH: TSH: 0.55 mIU/L (ref 0.40–4.50)

## 2022-11-17 ENCOUNTER — Encounter: Payer: Self-pay | Admitting: Family

## 2022-11-17 ENCOUNTER — Ambulatory Visit (INDEPENDENT_AMBULATORY_CARE_PROVIDER_SITE_OTHER): Payer: PPO | Admitting: Family

## 2022-11-17 VITALS — BP 116/80 | HR 82 | Temp 96.9°F | Resp 16 | Ht 64.0 in | Wt 173.4 lb

## 2022-11-17 DIAGNOSIS — R7303 Prediabetes: Secondary | ICD-10-CM

## 2022-11-17 DIAGNOSIS — F411 Generalized anxiety disorder: Secondary | ICD-10-CM | POA: Diagnosis not present

## 2022-11-17 DIAGNOSIS — E039 Hypothyroidism, unspecified: Secondary | ICD-10-CM | POA: Diagnosis not present

## 2022-11-17 DIAGNOSIS — E785 Hyperlipidemia, unspecified: Secondary | ICD-10-CM

## 2022-11-17 DIAGNOSIS — I10 Essential (primary) hypertension: Secondary | ICD-10-CM

## 2022-11-17 DIAGNOSIS — I482 Chronic atrial fibrillation, unspecified: Secondary | ICD-10-CM | POA: Diagnosis not present

## 2022-11-17 DIAGNOSIS — K219 Gastro-esophageal reflux disease without esophagitis: Secondary | ICD-10-CM

## 2022-11-17 NOTE — Progress Notes (Signed)
Provider: [name] FNP-C   [name], [name], NP  Patient Care Team: Miyuki Rzasa, [name], NP as PCP - General (Family Medicine) [name], MD as Consulting Physician (Dermatology) [name], MD as Consulting Physician (Otolaryngology) [name], MD as Referring Physician (Cardiology)  Extended Emergency Contact Information Primary Emergency Contact: [name] [place] Phone: [phone] Work Phone: [phone] Relation: Daughter Secondary Emergency Contact: [name] [place] Phone: [phone] Mobile Phone: [phone] Relation: Granddaughter  Code Status:  DNR Goals of care: Advanced Directive information    11/17/2022    2:54 PM  Advanced Directives  Does Patient Have a Medical Advance Directive? Yes  Type of Paramedic of [place];Living will;Out of facility DNR (pink MOST or yellow form)  Does patient want to make changes to medical advance directive? No - Patient declined  Copy of [place] in Chart? Yes - validated most recent copy scanned in chart (See row information)     Chief Complaint  Patient presents with   Medical Management of Chronic Issues    6 month follow up.    Immunizations    Discuss the need for [employer].    HPI:  Pt is a 89 y.o. female seen today for 6 months follow up for medical management of chronic diseases.she is here with her daughter.she denies any new acute issues.  Recent lab results reviewed and discussed during the visit.  Labs unremarkable except hemoglobin A 1 C 6.2, glucose 108 previous 6.0.   Has had 2 pounds weight loss since last seen.Tends to eat 2 meals per day and snacks. Denies any loss of appetite.  No fall episodes or recent hospitalization.  Hide immunizations are up-to-date except due for COVID-19 booster vaccine.  Aware to get vaccine at the pharmacy.   Past Medical History:  Diagnosis Date   Acid  reflux    Arthritis    Atrial fibrillation ([place])    Breast cancer ([place]) 2011   Left Breast Cancer   Cancer [facility]) 2011   Left Breast Cancer   Depression    Hyperlipidemia    Hypertension    Insomnia    Osteoporosis    Pacemaker 2010   WIITH DEFIB   Pelvic fracture ([place]) 2010   Thyroid disease    Past Surgical History:  Procedure Laterality Date   ABDOMINAL HYSTERECTOMY  35 yrs ago   BREAST LUMPECTOMY Left 05/23/10   CARDIAC DEFIBRILLATOR PLACEMENT  2 years ago   CHOLECYSTECTOMY  50 YEARS AGO   ELBOW SURGERY  2006    Allergies  Allergen Reactions   Codeine Nausea Only    Allergies as of 11/17/2022       Reactions   Codeine Nausea Only        Medication List        Accurate as of November 17, 2022  3:18 PM. If you have any questions, ask your nurse or doctor.          ABC PLUS PO Take 1 tablet by mouth daily.   acetaminophen 500 MG tablet Commonly known as: TYLENOL Take 500 mg by mouth every 8 (eight) hours as needed for mild pain or moderate pain. What changed: Another medication with the same name was removed. Continue taking this medication, and follow the directions you see here. Changed by: Sandrea Hughs, NP   Align 4 MG Caps Take 1 capsule (4 mg total) by mouth daily.   Calcium  Carb-Cholecalciferol 600-800 MG-UNIT Tabs Take 1 tablet by mouth every morning.   cholecalciferol 25 MCG (1000 UNIT) tablet Commonly known as: VITAMIN D3 Take 1,000 Units by mouth every morning.   diclofenac Sodium 1 % Gel Commonly known as: VOLTAREN Apply 2 g topically 4 (four) times daily as needed. What changed: Another medication with the same name was removed. Continue taking this medication, and follow the directions you see here. Changed by: Sandrea Hughs, NP   furosemide 40 MG tablet Commonly known as: LASIX Take 40 mg by mouth as needed.   gabapentin 600 MG tablet Commonly known as: NEURONTIN Take 1,200 mg by mouth at bedtime. What changed: Another  medication with the same name was removed. Continue taking this medication, and follow the directions you see here. Changed by: Sandrea Hughs, NP   metoprolol succinate 25 MG 24 hr tablet Commonly known as: TOPROL-XL Take 25 mg by mouth daily.   Omega-3 1000 MG Caps Take 1 capsule by mouth daily.   pantoprazole 40 MG tablet Commonly known as: PROTONIX TAKE 1 TABLET BY MOUTH ONCE DAILY   PARoxetine 20 MG tablet Commonly known as: PAXIL TAKE 1 TABLET (20 MG TOTAL) BY MOUTH DAILY.   potassium chloride 10 MEQ CR capsule Commonly known as: MICRO-K Take 1 capsule (10 mEq total) by mouth daily as needed. Along with furosemide   Pradaxa 75 MG Caps capsule Generic drug: dabigatran Take 75 mg by mouth 2 (two) times daily.   pravastatin 10 MG tablet Commonly known as: PRAVACHOL TAKE 1 TABLET BY MOUTH DAILY.   Synthroid 88 MCG tablet Generic drug: levothyroxine TAKE 1 TABLET (88 MCG TOTAL) BY MOUTH DAILY BEFORE BREAKFAST ON EMPTY STOMACH   UNABLE TO FIND Nutribiotic nasal spray   vitamin C 1000 MG tablet Take 500 mg by mouth daily. 1/2 tablet in the morning and 1/2 tablet in the evening.        Review of Systems  Constitutional:  Negative for appetite change, chills, fatigue, fever and unexpected weight change.  HENT:  Positive for hearing loss. Negative for congestion, dental problem, ear discharge, ear pain, facial swelling, nosebleeds, postnasal drip, rhinorrhea, sinus pressure, sinus pain, sneezing, sore throat, tinnitus and trouble swallowing.   Eyes:  Positive for visual disturbance. Negative for pain, discharge, redness and itching.       With eye glasses  Respiratory:  Negative for cough, chest tightness, shortness of breath and wheezing.   Cardiovascular:  Negative for chest pain, palpitations and leg swelling.  Gastrointestinal:  Negative for abdominal distention, abdominal pain, blood in stool, constipation, diarrhea, nausea and vomiting.  Endocrine: Negative for  cold intolerance, heat intolerance, polydipsia, polyphagia and polyuria.  Genitourinary:  Negative for difficulty urinating, dysuria, flank pain, frequency and urgency.  Musculoskeletal:  Positive for arthralgias and gait problem. Negative for back pain, joint swelling, myalgias, neck pain and neck stiffness.  Skin:  Negative for color change, pallor, rash and wound.  Neurological:  Positive for tremors. Negative for dizziness, syncope, speech difficulty, weakness, light-headedness, numbness and headaches.  Hematological:  Does not bruise/bleed easily.  Psychiatric/Behavioral:  Negative for agitation, behavioral problems, confusion, hallucinations, self-injury, sleep disturbance and suicidal ideas. The patient is not nervous/anxious.     Immunization History  Administered Date(s) Administered   Fluad Quad(high Dose 65+) 08/01/2019, 08/17/2020, 08/29/2022   Influenza, High Dose Seasonal PF 07/31/2018, 08/21/2021   Influenza-Unspecified 06/12/2015   Moderna Sars-Covid-2 Vaccination 03/20/2021   PFIZER(Purple Top)SARS-COV-2 Vaccination 12/16/2019, 01/09/2020, 08/11/2020, 03/20/2021   Pneumococcal  Conjugate-13 12/23/2016   Pneumococcal Polysaccharide-23 10/12/2003   Tdap 12/25/2017   Zoster Recombinat (Shingrix) 06/23/2021, 10/16/2021   Pertinent  Health Maintenance Due  Topic Date Due   INFLUENZA VACCINE  Completed   DEXA SCAN  Completed      07/23/2021    3:56 PM 11/03/2021    1:19 PM 05/19/2022    1:01 PM 08/12/2022    3:34 PM 11/17/2022    2:54 PM  [place] in the past year? 0 0 0 0 0  Was there an injury with Fall? 0 0 0 0 0  Fall Risk Category Calculator 0 0 0 0 0  Fall Risk Category Low Low Low Low Low  Patient Fall Risk Level Low fall risk Low fall risk Low fall risk Low fall risk Low fall risk  Patient at Risk for Falls Due to No Fall Risks No Fall Risks No Fall Risks No Fall Risks No Fall Risks  Fall risk Follow up Falls evaluation completed Falls evaluation  completed Falls evaluation completed Falls evaluation completed Falls evaluation completed   Functional Status Survey:    Vitals:   11/17/22 1451  BP: 116/80  Pulse: 82  Resp: 16  Temp: (!) 96.9 F (36.1 C)  SpO2: 98%  Weight: 173 lb 6.4 oz (78.7 kg)  Height: '5\' 4"'$  (1.626 m)   Body mass index is 29.76 kg/m. Physical Exam Vitals reviewed.  Constitutional:      General: She is not in acute distress.    Appearance: Normal appearance. She is overweight. She is not ill-appearing or diaphoretic.  HENT:     Head: Normocephalic.     Comments: Bilateral hearing aids in place    Right Ear: Tympanic membrane, ear canal and external ear normal. There is no impacted cerumen.     Left Ear: Tympanic membrane, ear canal and external ear normal. There is no impacted cerumen.     Nose: Nose normal. No congestion or rhinorrhea.     Mouth/Throat:     Mouth: Mucous membranes are moist.     Pharynx: Oropharynx is clear. No oropharyngeal exudate or posterior oropharyngeal erythema.  Eyes:     General: No scleral icterus.       Right eye: No discharge.        Left eye: No discharge.     Extraocular Movements: Extraocular movements intact.     Conjunctiva/sclera: Conjunctivae normal.     Pupils: Pupils are equal, round, and reactive to light.     Comments: Corrective lenses in place  Neck:     Vascular: No carotid bruit.  Cardiovascular:     Rate and Rhythm: Normal rate and regular rhythm.     Pulses: Normal pulses.     Heart sounds: Normal heart sounds. No murmur heard.    No friction rub. No gallop.  Pulmonary:     Effort: Pulmonary effort is normal. No respiratory distress.     Breath sounds: Normal breath sounds. No wheezing, rhonchi or rales.  Chest:     Chest wall: No tenderness.  Abdominal:     General: Bowel sounds are normal. There is no distension.     Palpations: Abdomen is soft. There is no mass.     Tenderness: There is no abdominal tenderness. There is no right CVA  tenderness, left CVA tenderness, guarding or rebound.  Musculoskeletal:        General: No swelling or tenderness. Normal range of motion.     Cervical back: Normal  range of motion. No rigidity or tenderness.     Right lower leg: No edema.     Left lower leg: No edema.     Comments: Ambulates with a rollator  Lymphadenopathy:     Cervical: No cervical adenopathy.  Skin:    General: Skin is warm and dry.     Coloration: Skin is not pale.     Findings: No bruising, erythema, lesion or rash.  Neurological:     Mental Status: She is alert. Mental status is at baseline.     Cranial Nerves: No cranial nerve deficit.     Sensory: No sensory deficit.     Motor: No weakness.     Coordination: Coordination normal.     Gait: Gait abnormal.  Psychiatric:        Mood and Affect: Mood normal.        Speech: Speech normal.        Behavior: Behavior normal.        Thought Content: Thought content normal.        Judgment: Judgment normal.    Labs reviewed: Recent Labs    05/16/22 1003 11/14/22 1012  NA 143 143  K 5.4* 4.5  CL 106 108  CO2 29 27  GLUCOSE 107* 108*  BUN 14 16  CREATININE 0.86 0.73  CALCIUM 10.5* 9.8  MG 2.0  --    Recent Labs    11/14/22 1012  AST 24  ALT 14  BILITOT 0.5  PROT 6.7   Recent Labs    05/16/22 1003 11/14/22 1012  WBC 5.4 6.3  NEUTROABS 2,981 4,026  HGB 13.9 13.3  HCT 42.7 40.0  MCV 93.2 90.5  PLT 279 256   Lab Results  Component Value Date   TSH 0.55 11/14/2022   Lab Results  Component Value Date   HGBA1C 6.2 (H) 11/14/2022   Lab Results  Component Value Date   CHOL 155 11/14/2022   HDL 55 11/14/2022   LDLCALC 77 11/14/2022   TRIG 124 11/14/2022   CHOLHDL 2.8 11/14/2022    Significant Diagnostic Results in last 30 days:  No results found.  Assessment/Plan 1. Essential hypertension Blood pressure well-controlled Continue on Metroprolol and furosemide - COMPLETE METABOLIC PANEL WITH GFR; Future - CBC with  Differential/Platelet; Future  2. Acquired hypothyroidism Lab Results  Component Value Date   TSH 0.55 11/14/2022  -Continue on Synthroid 88 mcg tablet daily on empty stomach at least 2 hours from other medication. - TSH; Future  3. Chronic atrial fibrillation (HCC) Heart rate control -Continue on metoprolol for rate control -Continue on Pradaxa for anticoagulation.  No bleeding no signs of bleeding reported Continue to follow-up with cardiologist - COMPLETE METABOLIC PANEL WITH GFR; Future - CBC with Differential/Platelet; Future  4. Prediabetes Lab Results  Component Value Date   HGBA1C 6.2 (H) 11/14/2022  -Continue with dietary modification and exercise as tolerated - Hemoglobin A1c; Future  5. Generalized anxiety disorder Mood stable Continue on Paxil Continue to monitor moods  6. Gastroesophageal reflux disease without esophagitis  symptoms stable Continue on Protonix  - CBC with Differential/Platelet; Future  7. Hyperlipidemia, unspecified hyperlipidemia type LDL at goal Continue on pravastatin - Lipid panel; Future  Family/ staff Communication: Reviewed plan of care with patient and daughter verbalized understanding  Labs/tests ordered:  - CBC with Differential/Platelet - CMP with eGFR([facility]) - TSH - Hgb A1C - Lipid panel  Next Appointment : Return in about 6 months (around 05/18/2023) for medical mangement  of chronic issues., Fasting labs in 6 months prior to visit.   Sandrea Hughs, NP

## 2022-12-11 DIAGNOSIS — Z4502 Encounter for adjustment and management of automatic implantable cardiac defibrillator: Secondary | ICD-10-CM | POA: Diagnosis not present

## 2022-12-11 DIAGNOSIS — I472 Ventricular tachycardia, unspecified: Secondary | ICD-10-CM | POA: Diagnosis not present

## 2022-12-19 ENCOUNTER — Ambulatory Visit (INDEPENDENT_AMBULATORY_CARE_PROVIDER_SITE_OTHER): Payer: PPO | Admitting: Orthopedic Surgery

## 2022-12-19 ENCOUNTER — Encounter: Payer: Self-pay | Admitting: Orthopedic Surgery

## 2022-12-19 VITALS — BP 120/80 | HR 73 | Temp 97.0°F | Resp 18 | Ht 64.0 in | Wt 175.1 lb

## 2022-12-19 DIAGNOSIS — H9201 Otalgia, right ear: Secondary | ICD-10-CM

## 2022-12-19 MED ORDER — VALACYCLOVIR HCL 1 G PO TABS: 1000.0000 mg | ORAL_TABLET | Freq: Two times a day (BID) | ORAL | 0 refills | Status: AC

## 2022-12-19 MED ORDER — AMOXICILLIN-POT CLAVULANATE 875-125 MG PO TABS: 1.0000 | ORAL_TABLET | Freq: Two times a day (BID) | ORAL | 0 refills | Status: AC

## 2022-12-19 NOTE — Patient Instructions (Signed)
Please purchase Debrox ear drops- apply to both ears twice daily x 5 days> then schedule to have ears flushed in office.   Start antibiotic> if no improvement within 24-48 hours start taking Valtrex.

## 2022-12-19 NOTE — Progress Notes (Signed)
Careteam: Patient Care Team: Ngetich, [name], NP as PCP - General (Family Medicine) [name], MD as Consulting Physician (Dermatology) [name], MD as Consulting Physician (Otolaryngology) [name], MD as Referring Physician (Cardiology)  Seen by: Windell Moulding, AGNP-C  PLACE OF SERVICE:  [place] Directive information    Allergies  Allergen Reactions   Codeine Nausea Only    Chief Complaint  Patient presents with   Acute Visit    Patient states that she has been having sharp ear pain in the right ear since in the middle of the night. Has a right ear drum that burst for long time.      HPI: Patient is a 89 y.o. female seen today due to right ear pain.   She began to have shooting right ear pain last night. She has not tried any interventions to relieve pain. H/o hearing loss with bilateral hearing aids, followed [facility]. Denies chest pain, fever, cough, sore throat or nasal congestion. She also denies recent cold within past month.   Review of Systems:  Review of Systems  Constitutional:  Negative for chills, fever, malaise/fatigue and weight loss.  HENT:  Positive for ear pain and hearing loss. Negative for congestion, ear discharge, sore throat and tinnitus.   Respiratory:  Negative for cough, shortness of breath and wheezing.   Cardiovascular:  Negative for chest pain.  Gastrointestinal:  Negative for nausea and vomiting.  Musculoskeletal:  Negative for myalgias.  Neurological:  Negative for dizziness and headaches.  Psychiatric/Behavioral:  Negative for depression. The patient is not nervous/anxious.     Past Medical History:  Diagnosis Date   Acid reflux    Arthritis    Atrial fibrillation ([place])    Breast cancer ([place]) 2011   Left Breast Cancer   Cancer [facility]) 2011   Left Breast Cancer   Depression    Hyperlipidemia    Hypertension    Insomnia    Osteoporosis    Pacemaker 2010   WIITH DEFIB   Pelvic fracture ([place]) 2010    Thyroid disease    Past Surgical History:  Procedure Laterality Date   ABDOMINAL HYSTERECTOMY  35 yrs ago   BREAST LUMPECTOMY Left 05/23/10   CARDIAC DEFIBRILLATOR PLACEMENT  2 years ago   CHOLECYSTECTOMY  42 YEARS AGO   ELBOW SURGERY  2006   Social History:   reports that she has never smoked. She has never used smokeless tobacco. She reports that she does not drink alcohol and does not use drugs.  Family History  Problem Relation Age of Onset   Heart disease Mother    Diabetes Father    Stroke Father    Cancer Sister        lung   Cancer Brother        liver   COPD Brother    Heart attack Brother    Stroke Brother    Heart attack Brother     Medications: Patient's Medications  New Prescriptions   No medications on file  Previous Medications   ACETAMINOPHEN (TYLENOL) 500 MG TABLET    Take 500 mg by mouth every 8 (eight) hours as needed for mild pain or moderate pain.   ASCORBIC ACID (VITAMIN C) 1000 MG TABLET    Take 500 mg by mouth daily. 1/2 tablet in the morning and 1/2 tablet in the evening.   CALCIUM CARB-CHOLECALCIFEROL 600-800 MG-UNIT TABS    Take 1 tablet by mouth every morning.  CHOLECALCIFEROL (VITAMIN D3) 25 MCG (1000 UT) TABLET    Take 1,000 Units by mouth every morning.   DICLOFENAC SODIUM (VOLTAREN) 1 % GEL    Apply 2 g topically 4 (four) times daily as needed.   FUROSEMIDE (LASIX) 40 MG TABLET    Take 40 mg by mouth as needed.   GABAPENTIN (NEURONTIN) 600 MG TABLET    Take 1,200 mg by mouth at bedtime.   METOPROLOL SUCCINATE (TOPROL-XL) 25 MG 24 HR TABLET    Take 25 mg by mouth daily.    MULTIPLE VITAMINS-MINERALS (ABC PLUS PO)    Take 1 tablet by mouth daily.   OMEGA-3 1000 MG CAPS    Take 1 capsule by mouth daily.   PANTOPRAZOLE (PROTONIX) 40 MG TABLET    TAKE 1 TABLET BY MOUTH ONCE DAILY   PAROXETINE (PAXIL) 20 MG TABLET    TAKE 1 TABLET (20 MG TOTAL) BY MOUTH DAILY.   POTASSIUM CHLORIDE (MICRO-K) 10 MEQ CR CAPSULE    Take 1 capsule (10 mEq total) by  mouth daily as needed. Along with furosemide   PRADAXA 75 MG CAPS CAPSULE    Take 75 mg by mouth 2 (two) times daily.    PRAVASTATIN (PRAVACHOL) 10 MG TABLET    TAKE 1 TABLET BY MOUTH DAILY.   PROBIOTIC PRODUCT (ALIGN) 4 MG CAPS    Take 1 capsule (4 mg total) by mouth daily.   SYNTHROID 88 MCG TABLET    TAKE 1 TABLET (88 MCG TOTAL) BY MOUTH DAILY BEFORE BREAKFAST ON EMPTY STOMACH   UNABLE TO FIND    Nutribiotic nasal spray  Modified Medications   No medications on file  Discontinued Medications   No medications on file    Physical Exam:  Vitals:   12/19/22 1255  BP: 120/80  Pulse: 73  Resp: 18  Temp: (!) 97 F (36.1 C)  SpO2: 95%  Weight: 175 lb 2 oz (79.4 kg)  Height: 5' 4"$  (1.626 m)   Body mass index is 30.06 kg/m. Wt Readings from Last 3 Encounters:  12/19/22 175 lb 2 oz (79.4 kg)  11/17/22 173 lb 6.4 oz (78.7 kg)  05/19/22 174 lb 12.8 oz (79.3 kg)    Physical Exam Vitals reviewed.  Constitutional:      General: She is not in acute distress. HENT:     Head: Normocephalic.     Right Ear: Decreased hearing noted. No drainage, swelling or tenderness. Tympanic membrane is not bulging.     Left Ear: Decreased hearing noted. No drainage, swelling or tenderness. Tympanic membrane is not bulging.     Ears:     Comments: Cerumen in bilateral ear canals    Nose: Nose normal.     Mouth/Throat:     Mouth: Mucous membranes are moist.  Eyes:     General:        Right eye: No discharge.        Left eye: No discharge.  Cardiovascular:     Rate and Rhythm: Normal rate and regular rhythm.     Pulses: Normal pulses.     Heart sounds: Normal heart sounds.  Pulmonary:     Effort: Pulmonary effort is normal.     Breath sounds: Normal breath sounds.  Abdominal:     General: Bowel sounds are normal. There is no distension.     Palpations: Abdomen is soft.     Tenderness: There is no abdominal tenderness.  Musculoskeletal:     Cervical back: Neck supple.  Right lower leg:  No edema.     Left lower leg: No edema.  Skin:    General: Skin is warm and dry.     Capillary Refill: Capillary refill takes less than 2 seconds.     Comments: No rash to head/scalp/neck  Neurological:     General: No focal deficit present.     Mental Status: She is alert and oriented to person, place, and time.  Psychiatric:        Mood and Affect: Mood normal.        Behavior: Behavior normal.     Labs reviewed: Basic Metabolic Panel: Recent Labs    01/03/22 1035 05/16/22 1003 11/14/22 1012  NA  --  143 143  K  --  5.4* 4.5  CL  --  106 108  CO2  --  29 27  GLUCOSE  --  107* 108*  BUN  --  14 16  CREATININE  --  0.86 0.73  CALCIUM  --  10.5* 9.8  MG  --  2.0  --   TSH 1.56 0.73 0.55   Liver Function Tests: Recent Labs    11/14/22 1012  AST 24  ALT 14  BILITOT 0.5  PROT 6.7   No results for input(s): "LIPASE", "AMYLASE" in the last 8760 hours. No results for input(s): "AMMONIA" in the last 8760 hours. CBC: Recent Labs    05/16/22 1003 11/14/22 1012  WBC 5.4 6.3  NEUTROABS 2,981 4,026  HGB 13.9 13.3  HCT 42.7 40.0  MCV 93.2 90.5  PLT 279 256   Lipid Panel: Recent Labs    05/16/22 1003 11/14/22 1012  CHOL 165 155  HDL 54 55  LDLCALC 86 77  TRIG 153* 124  CHOLHDL 3.1 2.8   TSH: Recent Labs    01/03/22 1035 05/16/22 1003 11/14/22 1012  TSH 1.56 0.73 0.55   A1C: Lab Results  Component Value Date   HGBA1C 6.2 (H) 11/14/2022     Assessment/Plan 1. Right ear pain - increased pain x 1 day - TM normal, some cerumen in ear canal - AOM versus shingles?  - advised to start Valtrex if rash appears on face> completed shingrix 10/2021 - start tylenol 1000 mg po BID for pain> also on gabapentin - recommend heat applications - advised to contact PCP if symptoms worsen or do not resolve - amoxicillin-clavulanate (AUGMENTIN) 875-125 MG tablet; Take 1 tablet by mouth 2 (two) times daily for 7 days.  Dispense: 14 tablet; Refill: 0 - valACYclovir  (VALTREX) 1000 MG tablet; Take 1 tablet (1,000 mg total) by mouth 2 (two) times daily for 10 days.  Dispense: 20 tablet; Refill: 0  Total time: 25 minutes. Greater than 50% of total time spent doing patient education regarding ear pain including symptom/medication management.    Next appt: none Charlis Harner Webb, [facility] [phone]

## 2023-03-03 DIAGNOSIS — I472 Ventricular tachycardia, unspecified: Secondary | ICD-10-CM | POA: Diagnosis not present

## 2023-04-25 ENCOUNTER — Ambulatory Visit
Admission: RE | Admit: 2023-04-25 | Discharge: 2023-04-25 | Disposition: A | Discharge status: Home / Self Care | Payer: PPO | Source: Ambulatory Visit | Attending: Family | Admitting: Family

## 2023-04-25 ENCOUNTER — Ambulatory Visit (INDEPENDENT_AMBULATORY_CARE_PROVIDER_SITE_OTHER): Payer: PPO | Admitting: Family

## 2023-04-25 ENCOUNTER — Encounter: Payer: Self-pay | Admitting: Family

## 2023-04-25 VITALS — BP 122/80 | HR 75 | Temp 97.6°F | Resp 16 | Ht 64.0 in | Wt 171.0 lb

## 2023-04-25 DIAGNOSIS — M25512 Pain in left shoulder: Secondary | ICD-10-CM

## 2023-04-25 MED ORDER — PREDNISONE 20 MG PO TABS: ORAL_TABLET | ORAL | 0 refills | Status: AC

## 2023-04-25 NOTE — Progress Notes (Unsigned)
Provider: Richarda Blade FNP-C   Ekin Pilar, Donalee Citrin, NP  Patient Care Team: Chiana Wamser, Donalee Citrin, NP as PCP - General (Family Medicine) Venancio Poisson, MD as Consulting Physician (Dermatology) Osborn Coho, MD (Inactive) as Consulting Physician (Otolaryngology) Sandy Salaam, MD as Referring Physician (Cardiology)  Extended Emergency Contact Information Primary Emergency Contact: Enrique Sack [place] Home Phone: [phone] Work Phone: [phone] Relation: Daughter Secondary Emergency Contact: Thressa Sheller [place] Home Phone: [phone] Mobile Phone: [phone] Relation: Granddaughter  Code Status:  DNR Goals of care: Advanced Directive information    11/17/2022    2:54 PM  Advanced Directives  Does Patient Have a Medical Advance Directive? Yes  Type of Estate agent of [place];Living will;Out of facility DNR (pink MOST or yellow form)  Does patient want to make changes to medical advance directive? No - Patient declined  Copy of Healthcare Power of Attorney in Chart? Yes - validated most recent copy scanned in chart (See row information)     Chief Complaint  Patient presents with   Acute Visit    Patient is here for shoulder pain    HPI:  Pt is a 89 y.o. female seen today for left shoulder pain.states pain worst with trying to pickup stuff from the floor.Also unable to raise  hand up.pain radiates to elbow area.she denies any neck pain ,numbness,tingling or weakness on hand. She denies any injuries to shoulder.Has previous surgery to shoulder.   Past Medical History:  Diagnosis Date   Acid reflux    Arthritis    Atrial fibrillation (HCC)    Breast cancer (HCC) 2011   Left Breast Cancer   Cancer [facility]) 2011   Left Breast Cancer   Depression    Hyperlipidemia    Hypertension    Insomnia    Osteoporosis    Pacemaker 2010   WIITH DEFIB   Pelvic fracture (HCC) 2010   Thyroid disease    Past Surgical  History:  Procedure Laterality Date   ABDOMINAL HYSTERECTOMY  35 yrs ago   BREAST LUMPECTOMY Left 05/23/10   CARDIAC DEFIBRILLATOR PLACEMENT  2 years ago   CHOLECYSTECTOMY  50 YEARS AGO   ELBOW SURGERY  2006    Allergies  Allergen Reactions   Codeine Nausea Only    Allergies as of 04/25/2023       Reactions   Codeine Nausea Only        Medication List        Accurate as of April 25, 2023  3:09 PM. If you have any questions, ask your nurse or doctor.          ABC PLUS PO Take 1 tablet by mouth daily.   acetaminophen 500 MG tablet Commonly known as: TYLENOL Take 500 mg by mouth every 8 (eight) hours as needed for mild pain or moderate pain.   Align 4 MG Caps Take 1 capsule (4 mg total) by mouth daily.   Calcium Carb-Cholecalciferol 600-800 MG-UNIT Tabs Take 1 tablet by mouth every morning.   cholecalciferol 25 MCG (1000 UNIT) tablet Commonly known as: VITAMIN D3 Take 1,000 Units by mouth every morning.   diclofenac Sodium 1 % Gel Commonly known as: VOLTAREN Apply 2 g topically 4 (four) times daily as needed.   furosemide 40 MG tablet Commonly known as: LASIX Take 40 mg by mouth as needed.   gabapentin 600 MG tablet Commonly known as: NEURONTIN Take 1,200 mg by mouth at bedtime.   metoprolol succinate 25  MG 24 hr tablet Commonly known as: TOPROL-XL Take 25 mg by mouth daily.   Omega-3 1000 MG Caps Take 1 capsule by mouth daily.   pantoprazole 40 MG tablet Commonly known as: PROTONIX TAKE 1 TABLET BY MOUTH ONCE DAILY   PARoxetine 20 MG tablet Commonly known as: PAXIL TAKE 1 TABLET (20 MG TOTAL) BY MOUTH DAILY.   potassium chloride 10 MEQ CR capsule Commonly known as: MICRO-K Take 1 capsule (10 mEq total) by mouth daily as needed. Along with furosemide   Pradaxa 75 MG Caps capsule Generic drug: dabigatran Take 75 mg by mouth 2 (two) times daily.   pravastatin 10 MG tablet Commonly known as: PRAVACHOL TAKE 1 TABLET BY MOUTH DAILY.    Synthroid 88 MCG tablet Generic drug: levothyroxine TAKE 1 TABLET (88 MCG TOTAL) BY MOUTH DAILY BEFORE BREAKFAST ON EMPTY STOMACH   UNABLE TO FIND Nutribiotic nasal spray   vitamin C 1000 MG tablet Take 500 mg by mouth daily. 1/2 tablet in the morning and 1/2 tablet in the evening.        Review of Systems  Constitutional:  Negative for appetite change, chills, fatigue, fever and unexpected weight change.  Eyes:  Negative for pain, discharge, redness, itching and visual disturbance.  Respiratory:  Negative for cough, chest tightness, shortness of breath and wheezing.   Cardiovascular:  Negative for chest pain, palpitations and leg swelling.  Musculoskeletal:  Positive for arthralgias and gait problem. Negative for back pain, joint swelling, myalgias, neck pain and neck stiffness.       Left shoulder pain   Skin:  Negative for color change, pallor, rash and wound.  Neurological:  Negative for dizziness, weakness, light-headedness, numbness and headaches.    Immunization History  Administered Date(s) Administered   Fluad Quad(high Dose 65+) 08/01/2019, 08/17/2020, 08/29/2022   Influenza, High Dose Seasonal PF 07/31/2018, 08/21/2021   Influenza-Unspecified 06/12/2015   Moderna Sars-Covid-2 Vaccination 03/20/2021   PFIZER(Purple Top)SARS-COV-2 Vaccination 12/16/2019, 01/09/2020, 08/11/2020, 03/20/2021   Pneumococcal Conjugate-13 12/23/2016   Pneumococcal Polysaccharide-23 10/12/2003   Tdap 12/25/2017   Zoster Recombinat (Shingrix) 06/23/2021, 10/16/2021   Pertinent  Health Maintenance Due  Topic Date Due   INFLUENZA VACCINE  06/08/2023   DEXA SCAN  Completed      11/03/2021    1:19 PM 05/19/2022    1:01 PM 08/12/2022    3:34 PM 11/17/2022    2:54 PM 12/19/2022   12:53 PM  Fall Risk  Falls in the past year? 0 0 0 0 0  Was there an injury with Fall? 0 0 0 0 0  Fall Risk Category Calculator 0 0 0 0 0  Fall Risk Category (Retired) Low Low Low Low   (RETIRED) Patient Fall  Risk Level Low fall risk Low fall risk Low fall risk Low fall risk   Patient at Risk for Falls Due to No Fall Risks No Fall Risks No Fall Risks No Fall Risks No Fall Risks  Fall risk Follow up Falls evaluation completed Falls evaluation completed Falls evaluation completed Falls evaluation completed Falls evaluation completed   Functional Status Survey:    Vitals:   04/25/23 1436  BP: 122/80  Pulse: 75  Resp: 16  Temp: 97.6 F (36.4 C)  SpO2: 99%  Weight: 171 lb (77.6 kg)  Height: 5\' 4"  (1.626 m)   Body mass index is 29.35 kg/m. Physical Exam Vitals reviewed.  Constitutional:      General: She is not in acute distress.    Appearance: Normal  appearance. She is normal weight. She is not ill-appearing or diaphoretic.  HENT:     Head: Normocephalic.  Eyes:     General: No scleral icterus.       Right eye: No discharge.        Left eye: No discharge.     Conjunctiva/sclera: Conjunctivae normal.     Pupils: Pupils are equal, round, and reactive to light.  Neck:     Vascular: No carotid bruit.  Cardiovascular:     Rate and Rhythm: Normal rate and regular rhythm.     Pulses: Normal pulses.     Heart sounds: Normal heart sounds. No murmur heard.    No friction rub. No gallop.     Comments: Pace maker  Pulmonary:     Effort: Pulmonary effort is normal. No respiratory distress.     Breath sounds: Normal breath sounds. No wheezing, rhonchi or rales.  Chest:     Chest wall: No tenderness.  Abdominal:     General: Bowel sounds are normal. There is no distension.     Palpations: Abdomen is soft. There is no mass.     Tenderness: There is no abdominal tenderness. There is no right CVA tenderness, left CVA tenderness, guarding or rebound.  Musculoskeletal:        General: No swelling.     Right shoulder: Normal.     Left shoulder: Tenderness present. No swelling, effusion or crepitus. Decreased range of motion. Normal strength. Normal pulse.       Arms:     Cervical back:  Normal range of motion. No rigidity or tenderness.     Right lower leg: No edema.     Left lower leg: No edema.  Lymphadenopathy:     Cervical: No cervical adenopathy.  Skin:    General: Skin is warm and dry.     Coloration: Skin is not pale.     Findings: No bruising, erythema, lesion or rash.  Neurological:     Mental Status: She is alert and oriented to person, place, and time.     Cranial Nerves: No cranial nerve deficit.     Sensory: No sensory deficit.     Motor: No weakness.     Coordination: Coordination normal.     Gait: Gait normal.  Psychiatric:        Mood and Affect: Mood normal.        Speech: Speech normal.        Behavior: Behavior normal.        Thought Content: Thought content normal.        Judgment: Judgment normal.     Labs reviewed: Recent Labs    05/16/22 1003 11/14/22 1012  NA 143 143  K 5.4* 4.5  CL 106 108  CO2 29 27  GLUCOSE 107* 108*  BUN 14 16  CREATININE 0.86 0.73  CALCIUM 10.5* 9.8  MG 2.0  --    Recent Labs    11/14/22 1012  AST 24  ALT 14  BILITOT 0.5  PROT 6.7   Recent Labs    05/16/22 1003 11/14/22 1012  WBC 5.4 6.3  NEUTROABS 2,981 4,026  HGB 13.9 13.3  HCT 42.7 40.0  MCV 93.2 90.5  PLT 279 256   Lab Results  Component Value Date   TSH 0.55 11/14/2022   Lab Results  Component Value Date   HGBA1C 6.2 (H) 11/14/2022   Lab Results  Component Value Date   CHOL 155 11/14/2022  HDL 55 11/14/2022   LDLCALC 77 11/14/2022   TRIG 124 11/14/2022   CHOLHDL 2.8 11/14/2022    Significant Diagnostic Results in last 30 days:  No results found.  Assessment/Plan  Acute pain of left shoulder Left shoulder and scapula area very tender to palpation with decreased ROM but no swelling,erythema ,crepitus or effusion.strength 5/5 and circulation intact.  - advised to avoid lifting heavy load -Apply ice/heat as needed for pain -Continue with over-the-counter Extra strength Tylenol -Advised to get left shoulder X-ray at  [facility] imaging [facility] then will call you with results. -Discuss stating tapered prednisone as below - will send urgent referral to orthopedic  - DG Shoulder Left; Future - Ambulatory referral to Orthopedic Surgery - predniSONE (DELTASONE) 20 MG tablet; Take 2 tablets (40 mg total) by mouth daily with breakfast for 1 day, THEN 1.5 tablets (30 mg total) daily with breakfast for 1 day, THEN 1 tablet (20 mg total) daily with breakfast for 1 day, THEN 0.5 tablets (10 mg total) daily with breakfast for 1 day.  Dispense: 5 tablet; Refill: 0  Family/ staff Communication: Reviewed plan of care with patient, daughter and granddaughter  Labs/tests ordered:  - DG Shoulder Left; Future  Next Appointment : Return if symptoms worsen or fail to improve.   Caesar Bookman, NP

## 2023-04-26 ENCOUNTER — Other Ambulatory Visit: Payer: Self-pay

## 2023-04-26 ENCOUNTER — Ambulatory Visit (INDEPENDENT_AMBULATORY_CARE_PROVIDER_SITE_OTHER): Payer: PPO | Admitting: Orthopedic Surgery

## 2023-04-26 DIAGNOSIS — M25512 Pain in left shoulder: Secondary | ICD-10-CM

## 2023-04-26 DIAGNOSIS — M75122 Complete rotator cuff tear or rupture of left shoulder, not specified as traumatic: Secondary | ICD-10-CM | POA: Diagnosis not present

## 2023-04-29 ENCOUNTER — Encounter: Payer: Self-pay | Admitting: Orthopedic Surgery

## 2023-04-29 MED ORDER — BUPIVACAINE HCL 0.5 % IJ SOLN
9.0000 mL | INTRAMUSCULAR | Status: AC | PRN
Start: 2023-04-26 — End: 2023-04-26
  Administered 2023-04-26: 9 mL via INTRA_ARTICULAR

## 2023-04-29 MED ORDER — LIDOCAINE HCL 1 % IJ SOLN
5.0000 mL | INTRAMUSCULAR | Status: AC | PRN
Start: 2023-04-26 — End: 2023-04-26
  Administered 2023-04-26: 5 mL

## 2023-04-29 MED ORDER — METHYLPREDNISOLONE ACETATE 40 MG/ML IJ SUSP
40.0000 mg | INTRAMUSCULAR | Status: AC | PRN
Start: 2023-04-26 — End: 2023-04-26
  Administered 2023-04-26: 40 mg via INTRA_ARTICULAR

## 2023-04-29 NOTE — Progress Notes (Signed)
Office Visit Note   Patient: [name]           Date of Birth: [birth year]           MRN: [id number] Visit Date: 04/26/2023 Requested by: Caesar Bookman, NP [address] [place],  [place] [address] PCP: Caesar Bookman, NP  Subjective: Chief Complaint  Patient presents with   Left Shoulder - Pain    HPI: [name] is a 89 y.o. female who presents to the office reporting left shoulder pain.  Been going on for 2 to 3 weeks.  Denies any history of injury.  Does wake her from sleep at night.  She ambulates with a rolling walker.  Notes more for balance and not as much for weightbearing.  Does report decreased range of motion in that left shoulder.  Reports relatively constant pain but not 24/7.  Heating pad helps.  Radiates just below the shoulder region but not into the hand.  Denies any neck pain.  Patient was prescribed prednisone but has not picked it up yet.  Radiographs done at [facility] imaging demonstrate maintenance of acromiohumeral interval along with no glenohumeral joint arthritis..                ROS: All systems reviewed are negative as they relate to the chief complaint within the history of present illness.  Patient denies fevers or chills.  Assessment & Plan: Visit Diagnoses:  1. Left shoulder pain, unspecified chronicity     Plan: Impression is left shoulder pain with some coarseness on exam indicating possible rotator cuff pathology or nonradiographic arthritis.  Ultrasound-guided glenohumeral joint injection performed today.  I think that will help for a period of time.  She will follow-up with [place] as needed.  Follow-Up Instructions: No follow-ups on file.   Orders:  Orders Placed This Encounter  Procedures   US Guided Needle Placement - No Linked Charges   No orders of the defined types were placed in this encounter.     Procedures: Large Joint Inj: L glenohumeral on 04/26/2023 3:29 PM Indications: diagnostic evaluation and pain Details: 22 G 1.5  in needle, ultrasound-guided posterior approach  Arthrogram: No  Medications: 9 mL bupivacaine 0.5 %; 40 mg methylPREDNISolone acetate 40 MG/ML; 5 mL lidocaine 1 % Outcome: tolerated well, no immediate complications Procedure, treatment alternatives, risks and benefits explained, specific risks discussed. Consent was given by the patient. Immediately prior to procedure a time out was called to verify the correct patient, procedure, equipment, support staff and site/side marked as required. Patient was prepped and draped in the usual sterile fashion.       Clinical Data: No additional findings.  Objective: Vital Signs: There were no vitals taken for this visit.  Physical Exam:  Constitutional: Patient appears well-developed HEENT:  Head: Normocephalic Eyes:EOM are normal Neck: Normal range of motion Cardiovascular: Normal rate Pulmonary/chest: Effort normal Neurologic: Patient is alert Skin: Skin is warm Psychiatric: Patient has normal mood and affect  Ortho Exam: Ortho exam demonstrates full active and passive range of motion of the cervical spine.  Bilateral shoulders have passive range of motion of 70/95/150.  A little bit of weakness to external rotation on the left compared to the right at 5- out of 5 compared to 5+ out of 5.  Subscap strength is intact.  No Popeye deformity in that left shoulder.  No discrete AC joint tenderness on the left-hand side.  Does have coarseness with internal and external rotation  at 90 degrees of abduction.  Specialty Comments:  No specialty comments available.  Imaging: No results found.   PMFS History: Patient Active Problem List   Diagnosis Date Noted   Arthritis 05/31/2022   Cancer (HCC) 05/31/2022   Depression 05/31/2022   HOH (hard of hearing) 05/31/2022   Insomnia 05/31/2022   Acute foot pain, right 05/19/2022   Chronic pain of both knees 05/19/2022   Prediabetes 05/19/2022   Class 1 obesity due to excess calories with serious  comorbidity and body mass index (BMI) of 30.0 to 30.9 in adult 05/19/2022   Body mass index (BMI) of 30.0-30.9 in adult 05/19/2022   Long term current use of anticoagulant 11/03/2021   Atrial fibrillation (HCC)    Allergic rhinitis 11/06/2019   Arthralgia of left temporomandibular joint 05/15/2019   Left ear pain 05/15/2019   Stroke-like symptoms 12/11/2018   Essential hypertension 12/11/2018   Hypertriglyceridemia 12/11/2018   Hypothyroidism 12/11/2018   AICD (automatic cardioverter/defibrillator) present 12/11/2018   GERD (gastroesophageal reflux disease) 12/11/2018   Anxiety 12/11/2018   Elevated brain natriuretic peptide (BNP) level 12/11/2018   Hyperglycemia 12/11/2018   Sensorineural hearing loss (SNHL) of both ears 04/26/2017   Tonsillar cyst 04/26/2017   Generalized anxiety disorder 01/02/2017   Chronic right-sided low back pain with right-sided sciatica 01/02/2017   Spondylosis without myelopathy or radiculopathy, lumbar region 01/02/2017   Lumbar radiculopathy 01/02/2017   Implantable cardioverter-defibrillator (ICD) generator end of life 02/10/2016   Fatigue 01/15/2016   Ventricular tachycardia, unspecified (HCC) 07/14/2015   History of left breast cancer 06/03/2011   Past Medical History:  Diagnosis Date   Acid reflux    Arthritis    Atrial fibrillation (HCC)    Breast cancer (HCC) 2011   Left Breast Cancer   Cancer (HCC) 2011   Left Breast Cancer   Depression    Hyperlipidemia    Hypertension    Insomnia    Osteoporosis    Pacemaker 2010   WIITH DEFIB   Pelvic fracture (HCC) 2010   Thyroid disease     Family History  Problem Relation Age of Onset   Heart disease Mother    Diabetes Father    Stroke Father    Cancer Sister        lung   Cancer Brother        liver   COPD Brother    Heart attack Brother    Stroke Brother    Heart attack Brother     Past Surgical History:  Procedure Laterality Date   ABDOMINAL HYSTERECTOMY  35 yrs ago   BREAST  LUMPECTOMY Left 05/23/10   CARDIAC DEFIBRILLATOR PLACEMENT  2 years ago   CHOLECYSTECTOMY  50 YEARS AGO   ELBOW SURGERY  2006   Social History   Occupational History   Not on file  Tobacco Use   Smoking status: Never   Smokeless tobacco: Never  Vaping Use   Vaping Use: Never used  Substance and Sexual Activity   Alcohol use: No   Drug use: Never   Sexual activity: Not on file

## 2023-05-02 ENCOUNTER — Other Ambulatory Visit: Payer: Self-pay

## 2023-05-02 DIAGNOSIS — I482 Chronic atrial fibrillation, unspecified: Secondary | ICD-10-CM

## 2023-05-02 DIAGNOSIS — R7303 Prediabetes: Secondary | ICD-10-CM

## 2023-05-02 DIAGNOSIS — E039 Hypothyroidism, unspecified: Secondary | ICD-10-CM

## 2023-05-02 DIAGNOSIS — E785 Hyperlipidemia, unspecified: Secondary | ICD-10-CM

## 2023-05-02 DIAGNOSIS — I10 Essential (primary) hypertension: Secondary | ICD-10-CM

## 2023-05-02 DIAGNOSIS — K219 Gastro-esophageal reflux disease without esophagitis: Secondary | ICD-10-CM

## 2023-05-08 ENCOUNTER — Other Ambulatory Visit: Payer: Self-pay | Admitting: Family

## 2023-05-22 ENCOUNTER — Other Ambulatory Visit: Payer: PPO

## 2023-05-22 DIAGNOSIS — K219 Gastro-esophageal reflux disease without esophagitis: Secondary | ICD-10-CM | POA: Diagnosis not present

## 2023-05-22 DIAGNOSIS — I482 Chronic atrial fibrillation, unspecified: Secondary | ICD-10-CM | POA: Diagnosis not present

## 2023-05-22 DIAGNOSIS — I10 Essential (primary) hypertension: Secondary | ICD-10-CM | POA: Diagnosis not present

## 2023-05-22 DIAGNOSIS — E039 Hypothyroidism, unspecified: Secondary | ICD-10-CM | POA: Diagnosis not present

## 2023-05-22 DIAGNOSIS — R7303 Prediabetes: Secondary | ICD-10-CM | POA: Diagnosis not present

## 2023-05-22 DIAGNOSIS — E785 Hyperlipidemia, unspecified: Secondary | ICD-10-CM | POA: Diagnosis not present

## 2023-05-22 LAB — CBC WITH DIFFERENTIAL/PLATELET
Absolute Monocytes: 462 cells/uL (ref 200–950)
Basophils Relative: 0.5 %
Eosinophils Absolute: 325 cells/uL (ref 15–500)
MCH: 30.2 pg (ref 27.0–33.0)
MPV: 10.9 fL (ref 7.5–12.5)
Neutro Abs: 3511 cells/uL (ref 1500–7800)
Neutrophils Relative %: 61.6 %
Platelets: 239 10*3/uL (ref 140–400)

## 2023-05-23 LAB — CBC WITH DIFFERENTIAL/PLATELET
Basophils Absolute: 29 cells/uL (ref 0–200)
Eosinophils Relative: 5.7 %
HCT: 41.5 % (ref 35.0–45.0)
Hemoglobin: 13.6 g/dL (ref 11.7–15.5)
Lymphs Abs: 1374 cells/uL (ref 850–3900)
MCHC: 32.8 g/dL (ref 32.0–36.0)
MCV: 92 fL (ref 80.0–100.0)
Monocytes Relative: 8.1 %
RBC: 4.51 10*6/uL (ref 3.80–5.10)
RDW: 13.4 % (ref 11.0–15.0)
Total Lymphocyte: 24.1 %
WBC: 5.7 10*3/uL (ref 3.8–10.8)

## 2023-05-23 LAB — COMPLETE METABOLIC PANEL WITH GFR
AG Ratio: 1.6 (calc) (ref 1.0–2.5)
ALT: 13 U/L (ref 6–29)
AST: 23 U/L (ref 10–35)
Albumin: 4.1 g/dL (ref 3.6–5.1)
Alkaline phosphatase (APISO): 46 U/L (ref 37–153)
BUN: 15 mg/dL (ref 7–25)
CO2: 25 mmol/L (ref 20–32)
Calcium: 9.5 mg/dL (ref 8.6–10.4)
Chloride: 107 mmol/L (ref 98–110)
Creat: 0.78 mg/dL (ref 0.60–0.95)
Globulin: 2.6 g/dL (calc) (ref 1.9–3.7)
Glucose, Bld: 107 mg/dL — ABNORMAL HIGH (ref 65–99)
Potassium: 4.4 mmol/L (ref 3.5–5.3)
Sodium: 143 mmol/L (ref 135–146)
Total Bilirubin: 0.6 mg/dL (ref 0.2–1.2)
Total Protein: 6.7 g/dL (ref 6.1–8.1)
eGFR: 70 mL/min/{1.73_m2} (ref >=60)

## 2023-05-23 LAB — LIPID PANEL
Cholesterol: 149 mg/dL (ref <200)
HDL: 56 mg/dL (ref >=50)
LDL Cholesterol (Calc): 70 mg/dL (calc)
Non-HDL Cholesterol (Calc): 93 mg/dL (calc) (ref <130)
Total CHOL/HDL Ratio: 2.7 (calc) (ref <5.0)
Triglycerides: 147 mg/dL (ref <150)

## 2023-05-23 LAB — TSH: TSH: 0.61 mIU/L (ref 0.40–4.50)

## 2023-05-23 LAB — HEMOGLOBIN A1C
Hgb A1c MFr Bld: 6.5 % of total Hgb — ABNORMAL HIGH (ref <5.7)
Mean Plasma Glucose: 140 mg/dL
eAG (mmol/L): 7.7 mmol/L

## 2023-05-24 ENCOUNTER — Ambulatory Visit: Payer: PPO | Admitting: Family

## 2023-05-25 ENCOUNTER — Encounter: Payer: Self-pay | Admitting: Family

## 2023-05-25 ENCOUNTER — Ambulatory Visit: Payer: PPO | Admitting: Family

## 2023-05-25 ENCOUNTER — Other Ambulatory Visit: Payer: PPO

## 2023-05-25 VITALS — BP 118/60 | HR 66 | Temp 97.8°F | Resp 18 | Ht 64.0 in | Wt 173.0 lb

## 2023-05-25 DIAGNOSIS — F411 Generalized anxiety disorder: Secondary | ICD-10-CM

## 2023-05-25 DIAGNOSIS — I482 Chronic atrial fibrillation, unspecified: Secondary | ICD-10-CM | POA: Diagnosis not present

## 2023-05-25 DIAGNOSIS — R7303 Prediabetes: Secondary | ICD-10-CM

## 2023-05-25 DIAGNOSIS — E039 Hypothyroidism, unspecified: Secondary | ICD-10-CM

## 2023-05-25 DIAGNOSIS — E782 Mixed hyperlipidemia: Secondary | ICD-10-CM | POA: Diagnosis not present

## 2023-05-25 DIAGNOSIS — I10 Essential (primary) hypertension: Secondary | ICD-10-CM | POA: Diagnosis not present

## 2023-05-25 MED ORDER — FUROSEMIDE 40 MG PO TABS: 40.0000 mg | ORAL_TABLET | ORAL | 3 refills | Status: DC | PRN

## 2023-05-25 MED ORDER — POTASSIUM CHLORIDE ER 10 MEQ PO CPCR: 10.0000 meq | ORAL_CAPSULE | Freq: Every day | ORAL | 1 refills | Status: DC | PRN

## 2023-05-25 MED ORDER — CLONAZEPAM 0.5 MG PO TABS: 0.5000 mg | ORAL_TABLET | Freq: Every evening | ORAL | 1 refills | Status: DC | PRN

## 2023-05-25 NOTE — Progress Notes (Signed)
Provider: Richarda Blade FNP-C   [name], Alicia Citrin, NP  Patient Care Team: Christeen Lai, Alicia Citrin, NP as PCP - General (Family Medicine) Venancio Poisson, MD as Consulting Physician (Dermatology) Osborn Coho, MD (Inactive) as Consulting Physician (Otolaryngology) Sandy Salaam, MD as Referring Physician (Cardiology)  Extended Emergency Contact Information Primary Emergency Contact: Enrique Sack [place] Home Phone: [phone] Work Phone: [phone] Relation: Daughter Secondary Emergency Contact: Thressa Sheller [place] Home Phone: [phone] Mobile Phone: [phone] Relation: Granddaughter  Code Status:  DNR Goals of care: Advanced Directive information    05/25/2023    9:01 AM  Advanced Directives  Does Patient Have a Medical Advance Directive? Yes  Type of Advance Directive Out of facility DNR (pink MOST or yellow form)  Does patient want to make changes to medical advance directive? No - Patient declined  Pre-existing out of facility DNR order (yellow form or pink MOST form) Pink MOST form placed in chart (order not valid for inpatient use);Yellow form placed in chart (order not valid for inpatient use)     Chief Complaint  Patient presents with   Medical Management of Chronic Issues    Patient is here for a 48M follow up for chronic conditions     HPI:  Pt is a 89 y.o. female seen today for 6 months follow up for  medical management of chronic diseases.  Recent lab results reviewed and discussed during visit.    Hypertension - No home B/p readings for review.she denies any headache,dizziness,vision changes,fatigue,chest tightness,palpitation,chest pain or shortness of breath.  Prediabetes - Hgb A1C slightly higher than previous 6.5,glucose 107, previous A1C 6.2   Hypothyroidism - recent TSH level was normal.on synthroid 88 mcg tablet daily.takes on empty stomach.  Hyperlipidemia -   Chol 149,TRG 147 and LDL 70 stable compared to  previous.on pravastatin.she denies any side effects muscle pain or weakness.   Generalized anxiety - on Paxil 20 mg daily.complains of waking up 2-3 am unable to sleep worried and thinks of friends and family that are deceased. States Ambien work well for sleep but was discontinued by previous PCP due to high risk for falls.    Past Medical History:  Diagnosis Date   Acid reflux    Arthritis    Atrial fibrillation (HCC)    Breast cancer (HCC) 2011   Left Breast Cancer   Cancer [facility]) 2011   Left Breast Cancer   Depression    Hyperlipidemia    Hypertension    Insomnia    Osteoporosis    Pacemaker 2010   WIITH DEFIB   Pelvic fracture (HCC) 2010   Thyroid disease    Past Surgical History:  Procedure Laterality Date   ABDOMINAL HYSTERECTOMY  35 yrs ago   BREAST LUMPECTOMY Left 05/23/10   CARDIAC DEFIBRILLATOR PLACEMENT  2 years ago   CHOLECYSTECTOMY  50 YEARS AGO   ELBOW SURGERY  2006    Allergies  Allergen Reactions   Codeine Nausea Only    Allergies as of 05/25/2023       Reactions   Codeine Nausea Only        Medication List        Accurate as of May 25, 2023 11:21 PM. If you have any questions, ask your nurse or doctor.          ABC PLUS PO Take 1 tablet by mouth daily.   acetaminophen 500 MG tablet Commonly known as: TYLENOL Take 500 mg by mouth every 8 (  eight) hours as needed for mild pain or moderate pain.   Align 4 MG Caps Take 1 capsule (4 mg total) by mouth daily.   Calcium Carb-Cholecalciferol 600-800 MG-UNIT Tabs Take 1 tablet by mouth every morning.   cholecalciferol 25 MCG (1000 UNIT) tablet Commonly known as: VITAMIN D3 Take 1,000 Units by mouth every morning.   clonazePAM 0.5 MG tablet Commonly known as: KLONOPIN Take 1 tablet (0.5 mg total) by mouth at bedtime as needed for anxiety. Started by: [name] Chizaram Latino   diclofenac Sodium 1 % Gel Commonly known as: VOLTAREN Apply 2 g topically 4 (four) times daily as needed.    furosemide 40 MG tablet Commonly known as: LASIX Take 1 tablet (40 mg total) by mouth as needed.   gabapentin 600 MG tablet Commonly known as: NEURONTIN Take 1,200 mg by mouth at bedtime.   metoprolol succinate 25 MG 24 hr tablet Commonly known as: TOPROL-XL Take 25 mg by mouth daily.   Omega-3 1000 MG Caps Take 1 capsule by mouth daily.   pantoprazole 40 MG tablet Commonly known as: PROTONIX TAKE 1 TABLET BY MOUTH ONCE DAILY   PARoxetine 20 MG tablet Commonly known as: PAXIL TAKE 1 TABLET (20 MG TOTAL) BY MOUTH DAILY.   potassium chloride 10 MEQ CR capsule Commonly known as: MICRO-K Take 1 capsule (10 mEq total) by mouth daily as needed. Along with furosemide   Pradaxa 75 MG Caps capsule Generic drug: dabigatran Take 75 mg by mouth 2 (two) times daily.   pravastatin 10 MG tablet Commonly known as: PRAVACHOL TAKE 1 TABLET BY MOUTH DAILY.   Synthroid 88 MCG tablet Generic drug: levothyroxine TAKE 1 TABLET (88 MCG TOTAL) BY MOUTH DAILY BEFORE BREAKFAST ON EMPTY STOMACH   UNABLE TO FIND Nutribiotic nasal spray   vitamin C 1000 MG tablet Take 500 mg by mouth daily. 1/2 tablet in the morning and 1/2 tablet in the evening.        Review of Systems  Constitutional:  Negative for appetite change, chills, fatigue, fever and unexpected weight change.  HENT:  Negative for congestion, dental problem, ear discharge, ear pain, facial swelling, hearing loss, nosebleeds, postnasal drip, rhinorrhea, sinus pressure, sinus pain, sneezing, sore throat, tinnitus and trouble swallowing.   Eyes:  Negative for pain, discharge, redness, itching and visual disturbance.  Respiratory:  Negative for cough, chest tightness, shortness of breath and wheezing.   Cardiovascular:  Negative for chest pain, palpitations and leg swelling.  Gastrointestinal:  Negative for abdominal distention, abdominal pain, blood in stool, constipation, diarrhea, nausea and vomiting.  Endocrine: Negative for  cold intolerance, heat intolerance, polydipsia, polyphagia and polyuria.  Genitourinary:  Negative for difficulty urinating, dysuria, flank pain, frequency and urgency.  Musculoskeletal:  Positive for gait problem. Negative for arthralgias, back pain, joint swelling, myalgias, neck pain and neck stiffness.  Skin:  Negative for color change, pallor, rash and wound.  Neurological:  Negative for dizziness, syncope, speech difficulty, weakness, light-headedness, numbness and headaches.  Hematological:  Does not bruise/bleed easily.  Psychiatric/Behavioral:  Positive for sleep disturbance. Negative for agitation, behavioral problems, confusion, hallucinations, self-injury and suicidal ideas. The patient is nervous/anxious.     Immunization History  Administered Date(s) Administered   Fluad Quad(high Dose 65+) 08/01/2019, 08/17/2020, 08/29/2022   Influenza, High Dose Seasonal PF 07/31/2018, 08/21/2021   Influenza-Unspecified 06/12/2015   Moderna Sars-Covid-2 Vaccination 03/20/2021   PFIZER(Purple Top)SARS-COV-2 Vaccination 12/16/2019, 01/09/2020, 08/11/2020, 03/20/2021   Pneumococcal Conjugate-13 12/23/2016   Pneumococcal Polysaccharide-23 10/12/2003   Tdap  12/25/2017   Zoster Recombinant(Shingrix) 06/23/2021, 10/16/2021   Pertinent  Health Maintenance Due  Topic Date Due   INFLUENZA VACCINE  06/08/2023   DEXA SCAN  Completed      05/19/2022    1:01 PM 08/12/2022    3:34 PM 11/17/2022    2:54 PM 12/19/2022   12:53 PM 05/25/2023    9:02 AM  Fall Risk  Falls in the past year? 0 0 0 0 0  Was there an injury with Fall? 0 0 0 0 0  Fall Risk Category Calculator 0 0 0 0 0  Fall Risk Category (Retired) Low Low Low    (RETIRED) Patient Fall Risk Level Low fall risk Low fall risk Low fall risk    Patient at Risk for Falls Due to No Fall Risks No Fall Risks No Fall Risks No Fall Risks No Fall Risks  Fall risk Follow up Falls evaluation completed Falls evaluation completed Falls evaluation completed  Falls evaluation completed Falls evaluation completed   Functional Status Survey:    Vitals:   05/25/23 1300  BP: 118/60  Pulse: 66  Resp: 18  Temp: 97.8 F (36.6 C)  SpO2: 96%  Weight: 173 lb (78.5 kg)  Height: 5\' 4"  (1.626 m)   Body mass index is 29.7 kg/m. Physical Exam Vitals reviewed.  Constitutional:      General: She is not in acute distress.    Appearance: Normal appearance. She is overweight. She is not ill-appearing or diaphoretic.  HENT:     Head: Normocephalic.     Right Ear: Tympanic membrane, ear canal and external ear normal. There is no impacted cerumen.     Left Ear: Tympanic membrane, ear canal and external ear normal. There is no impacted cerumen.     Nose: Nose normal. No congestion or rhinorrhea.     Mouth/Throat:     Mouth: Mucous membranes are moist.     Pharynx: Oropharynx is clear. No oropharyngeal exudate or posterior oropharyngeal erythema.  Eyes:     General: No scleral icterus.       Right eye: No discharge.        Left eye: No discharge.     Extraocular Movements: Extraocular movements intact.     Conjunctiva/sclera: Conjunctivae normal.     Pupils: Pupils are equal, round, and reactive to light.  Neck:     Vascular: No carotid bruit.  Cardiovascular:     Rate and Rhythm: Normal rate and regular rhythm.     Pulses: Normal pulses.     Heart sounds: Normal heart sounds. No murmur heard.    No friction rub. No gallop.  Pulmonary:     Effort: Pulmonary effort is normal. No respiratory distress.     Breath sounds: Normal breath sounds. No wheezing, rhonchi or rales.  Chest:     Chest wall: No tenderness.  Abdominal:     General: Bowel sounds are normal. There is no distension.     Palpations: Abdomen is soft. There is no mass.     Tenderness: There is no abdominal tenderness. There is no right CVA tenderness, left CVA tenderness, guarding or rebound.  Musculoskeletal:        General: No swelling or tenderness. Normal range of motion.      Cervical back: Normal range of motion. No rigidity or tenderness.     Right lower leg: No edema.     Left lower leg: No edema.  Lymphadenopathy:     Cervical: No cervical adenopathy.  Skin:    General: Skin is warm and dry.     Coloration: Skin is not pale.     Findings: No bruising, erythema, lesion or rash.  Neurological:     Mental Status: She is alert and oriented to person, place, and time.     Cranial Nerves: No cranial nerve deficit.     Sensory: No sensory deficit.     Motor: No weakness.     Coordination: Coordination normal.     Gait: Gait abnormal.  Psychiatric:        Mood and Affect: Mood normal.        Speech: Speech normal.        Behavior: Behavior normal.        Thought Content: Thought content normal.        Judgment: Judgment normal.    Labs reviewed: Recent Labs    11/14/22 1012 05/22/23 0954  NA 143 143  K 4.5 4.4  CL 108 107  CO2 27 25  GLUCOSE 108* 107*  BUN 16 15  CREATININE 0.73 0.78  CALCIUM 9.8 9.5   Recent Labs    11/14/22 1012 05/22/23 0954  AST 24 23  ALT 14 13  BILITOT 0.5 0.6  PROT 6.7 6.7   Recent Labs    11/14/22 1012 05/22/23 0954  WBC 6.3 5.7  NEUTROABS 4,026 3,511  HGB 13.3 13.6  HCT 40.0 41.5  MCV 90.5 92.0  PLT 256 239   Lab Results  Component Value Date   TSH 0.61 05/22/2023   Lab Results  Component Value Date   HGBA1C 6.5 (H) 05/22/2023   Lab Results  Component Value Date   CHOL 149 05/22/2023   HDL 56 05/22/2023   LDLCALC 70 05/22/2023   TRIG 147 05/22/2023   CHOLHDL 2.7 05/22/2023    Significant Diagnostic Results in last 30 days:  US Guided Needle Placement - No Linked Charges  Result Date: 04/29/2023 Ultrasound imaging demonstrates needle placement into the left glenohumeral joint with extravasation of fluid and no complicating features.   Assessment/Plan 1. Essential hypertension B/p well controlled  - continue on metoprolol and furosemide  - on pravastatin and Pradaxa   2. Acquired  hypothyroidism Lab Results  Component Value Date   TSH 0.61 05/22/2023  Continue on synthroid 88 mcg tablet daily on empty stomach   3. Prediabetes Lab Results  Component Value Date   HGBA1C 6.5 (H) 05/22/2023  Hgb A1C has slightly worsen from previous  - dietary modification and exercise discuss  4. Generalized anxiety disorder Wakes up around 2-3 am worried unable to sleep.Ambien helped in the past but was discontinued by previous PCP due to high risk for fall  - will start on clonazepam 0.5 mg tablet daily as bedtime PRN  5. Mixed hyperlipidemia LDL at gaol  - continue dietary modification and exercise  - continue on pravastatin   6. Chronic atrial fibrillation (HCC) HR controlled - continue on metoprolol and Pradaxa  Family/ staff Communication: Reviewed plan of care with patient verbalized understanding   Labs/tests ordered: - CBC with Differential/Platelet - CMP with eGFR([facility]) - TSH - Hgb A1C - Lipid panel  Next Appointment : Return in about 6 months (around 11/25/2023) for medical mangement of chronic issues., Fasting labs in 6 months prior to visit.   Caesar Bookman, NP

## 2023-06-02 ENCOUNTER — Encounter: Payer: Self-pay | Admitting: Family

## 2023-06-02 ENCOUNTER — Ambulatory Visit (INDEPENDENT_AMBULATORY_CARE_PROVIDER_SITE_OTHER): Payer: PPO | Admitting: Family

## 2023-06-02 ENCOUNTER — Telehealth: Payer: Self-pay

## 2023-06-02 ENCOUNTER — Ambulatory Visit
Admission: RE | Admit: 2023-06-02 | Discharge: 2023-06-02 | Disposition: A | Discharge status: Home / Self Care | Payer: PPO | Source: Ambulatory Visit | Attending: Family | Admitting: Family

## 2023-06-02 VITALS — BP 128/60 | HR 75 | Temp 98.0°F | Resp 18 | Ht 64.0 in | Wt 168.0 lb

## 2023-06-02 DIAGNOSIS — M549 Dorsalgia, unspecified: Secondary | ICD-10-CM | POA: Diagnosis not present

## 2023-06-02 DIAGNOSIS — F411 Generalized anxiety disorder: Secondary | ICD-10-CM | POA: Diagnosis not present

## 2023-06-02 DIAGNOSIS — N390 Urinary tract infection, site not specified: Secondary | ICD-10-CM | POA: Diagnosis not present

## 2023-06-02 DIAGNOSIS — M545 Low back pain, unspecified: Secondary | ICD-10-CM

## 2023-06-02 LAB — POCT URINALYSIS DIPSTICK
Bilirubin, UA: NEGATIVE
Blood, UA: NEGATIVE
Glucose, UA: NEGATIVE
Ketones, UA: NEGATIVE
Nitrite, UA: NEGATIVE
Protein, UA: NEGATIVE
Spec Grav, UA: 1.02 (ref 1.010–1.025)
Urobilinogen, UA: 0.2 E.U./dL
pH, UA: 6.5 (ref 5.0–8.0)

## 2023-06-02 NOTE — Progress Notes (Signed)
Provider: Richarda Blade FNP-C  Haru Anspaugh, Donalee Citrin, NP  Patient Care Team: Anyi Fels, Donalee Citrin, NP as PCP - General (Family Medicine) Venancio Poisson, MD as Consulting Physician (Dermatology) Osborn Coho, MD (Inactive) as Consulting Physician (Otolaryngology) Sandy Salaam, MD as Referring Physician (Cardiology)  Extended Emergency Contact Information Primary Emergency Contact: Enrique Sack [place] Home Phone: [phone] Work Phone: [phone] Relation: Daughter Secondary Emergency Contact: Thressa Sheller [place] Home Phone: [phone] Mobile Phone: [phone] Relation: Granddaughter  Code Status:  DNR Goals of care: Advanced Directive information    05/25/2023    9:01 AM  Advanced Directives  Does Patient Have a Medical Advance Directive? Yes  Type of Advance Directive Out of facility DNR (pink MOST or yellow form)  Does patient want to make changes to medical advance directive? No - Patient declined  Pre-existing out of facility DNR order (yellow form or pink MOST form) Pink MOST form placed in chart (order not valid for inpatient use);Yellow form placed in chart (order not valid for inpatient use)     Chief Complaint  Patient presents with   Acute Visit    Patient is here for lower back pain    HPI:  Pt is a 89 y.o. female seen today for an acute visit for evaluation of lower back pain.she is here with her Granddaughter who provides additional HPI information.Patient states lower back pain started three days ago and seems to be worsening.pain goes across the lower back and no radiation to legs or buttock.pain relieved by sitting on recliner.Has not taken any analgesic except Gabapentin that she takes at bedtime.she denies any fever,chills,nausea,vomiting,abdominal pain,flank pain,urgency,frequency,dysuria,or difficult urination.Also denies any numbness,tingling or weakness on extremities.  Has had no fall episode or injury to  back.Later during the visit also complains of left groin pain sometimes. Her bowel movement are usually regular LBM yesterday but thinks bowels will move after she eats her Lunch today.   She was here on 05/25/2023 was prescribed Klonopin 0.5 mg tablet but family states has been drowsy during the day and lethargic with low blood pressure.discussed with patient to stop Klonopin due to side effects.   Past Medical History:  Diagnosis Date   Acid reflux    Arthritis    Atrial fibrillation (HCC)    Breast cancer (HCC) 2011   Left Breast Cancer   Cancer [facility]) 2011   Left Breast Cancer   Depression    Hyperlipidemia    Hypertension    Insomnia    Osteoporosis    Pacemaker 2010   WIITH DEFIB   Pelvic fracture (HCC) 2010   Thyroid disease    Past Surgical History:  Procedure Laterality Date   ABDOMINAL HYSTERECTOMY  35 yrs ago   BREAST LUMPECTOMY Left 05/23/10   CARDIAC DEFIBRILLATOR PLACEMENT  2 years ago   CHOLECYSTECTOMY  50 YEARS AGO   ELBOW SURGERY  2006    Allergies  Allergen Reactions   Codeine Nausea Only    Outpatient Encounter Medications as of 06/02/2023  Medication Sig   acetaminophen (TYLENOL) 500 MG tablet Take 500 mg by mouth every 8 (eight) hours as needed for mild pain or moderate pain.   Ascorbic Acid (VITAMIN C) 1000 MG tablet Take 500 mg by mouth daily. 1/2 tablet in the morning and 1/2 tablet in the evening.   Calcium Carb-Cholecalciferol 600-800 MG-UNIT TABS Take 1 tablet by mouth every morning.   cholecalciferol (VITAMIN D3) 25 MCG (1000 UT) tablet Take 1,000 Units by  mouth every morning.   clonazePAM (KLONOPIN) 0.5 MG tablet Take 1 tablet (0.5 mg total) by mouth at bedtime as needed for anxiety. (Patient taking differently: Take 0.25 mg by mouth at bedtime as needed for anxiety.)   diclofenac Sodium (VOLTAREN) 1 % GEL Apply 2 g topically 4 (four) times daily as needed.   furosemide (LASIX) 40 MG tablet Take 1 tablet (40 mg total) by mouth as needed.    gabapentin (NEURONTIN) 600 MG tablet Take 1,200 mg by mouth at bedtime.   metoprolol succinate (TOPROL-XL) 25 MG 24 hr tablet Take 25 mg by mouth daily.    Multiple Vitamins-Minerals (ABC PLUS PO) Take 1 tablet by mouth daily.   Omega-3 1000 MG CAPS Take 1 capsule by mouth daily.   pantoprazole (PROTONIX) 40 MG tablet TAKE 1 TABLET BY MOUTH ONCE DAILY   PARoxetine (PAXIL) 20 MG tablet TAKE 1 TABLET (20 MG TOTAL) BY MOUTH DAILY.   potassium chloride (MICRO-K) 10 MEQ CR capsule Take 1 capsule (10 mEq total) by mouth daily as needed. Along with furosemide   PRADAXA 75 MG CAPS capsule Take 75 mg by mouth 2 (two) times daily.    pravastatin (PRAVACHOL) 10 MG tablet TAKE 1 TABLET BY MOUTH DAILY.   Probiotic Product (ALIGN) 4 MG CAPS Take 1 capsule (4 mg total) by mouth daily.   UNABLE TO FIND Nutribiotic nasal spray   SYNTHROID 88 MCG tablet TAKE 1 TABLET (88 MCG TOTAL) BY MOUTH DAILY BEFORE BREAKFAST ON EMPTY STOMACH   No facility-administered encounter medications on file as of 06/02/2023.    Review of Systems  Constitutional:  Negative for appetite change, chills, fatigue, fever and unexpected weight change.  Respiratory:  Negative for cough, chest tightness, shortness of breath and wheezing.   Cardiovascular:  Negative for chest pain, palpitations and leg swelling.  Gastrointestinal:  Negative for abdominal distention, abdominal pain, blood in stool, constipation, diarrhea, nausea and vomiting.  Genitourinary:  Negative for difficulty urinating, dysuria, flank pain, frequency and urgency.  Musculoskeletal:  Positive for arthralgias, back pain and gait problem. Negative for joint swelling, myalgias, neck pain and neck stiffness.  Skin:  Negative for color change, pallor and rash.  Neurological:  Positive for tremors. Negative for dizziness, speech difficulty, weakness, light-headedness, numbness and headaches.  Hematological:  Does not bruise/bleed easily.  Psychiatric/Behavioral:  Negative for  agitation, behavioral problems, confusion, hallucinations and sleep disturbance. The patient is nervous/anxious.     Immunization History  Administered Date(s) Administered   Fluad Quad(high Dose 65+) 08/01/2019, 08/17/2020, 08/29/2022   Influenza, High Dose Seasonal PF 07/31/2018, 08/21/2021   Influenza-Unspecified 06/12/2015   Moderna Sars-Covid-2 Vaccination 03/20/2021   PFIZER(Purple Top)SARS-COV-2 Vaccination 12/16/2019, 01/09/2020, 08/11/2020, 03/20/2021   Pneumococcal Conjugate-13 12/23/2016   Pneumococcal Polysaccharide-23 10/12/2003   Tdap 12/25/2017   Zoster Recombinant(Shingrix) 06/23/2021, 10/16/2021   Pertinent  Health Maintenance Due  Topic Date Due   INFLUENZA VACCINE  06/08/2023   DEXA SCAN  Completed      05/19/2022    1:01 PM 08/12/2022    3:34 PM 11/17/2022    2:54 PM 12/19/2022   12:53 PM 05/25/2023    9:02 AM  Fall Risk  Falls in the past year? 0 0 0 0 0  Was there an injury with Fall? 0 0 0 0 0  Fall Risk Category Calculator 0 0 0 0 0  Fall Risk Category (Retired) Low Low Low    (RETIRED) Patient Fall Risk Level Low fall risk Low fall risk Low fall  risk    Patient at Risk for Falls Due to No Fall Risks No Fall Risks No Fall Risks No Fall Risks No Fall Risks  Fall risk Follow up Falls evaluation completed Falls evaluation completed Falls evaluation completed Falls evaluation completed Falls evaluation completed   Functional Status Survey:    Vitals:   06/02/23 1327  BP: 128/60  Pulse: 75  Resp: 18  Temp: 98 F (36.7 C)  SpO2: 98%  Weight: 168 lb (76.2 kg)  Height: 5\' 4"  (1.626 m)   Body mass index is 28.84 kg/m. Physical Exam Vitals reviewed.  Constitutional:      General: She is not in acute distress.    Appearance: Normal appearance. She is overweight. She is not ill-appearing or diaphoretic.  HENT:     Head: Normocephalic.  Eyes:     General: No scleral icterus.       Right eye: No discharge.        Left eye: No discharge.      Conjunctiva/sclera: Conjunctivae normal.     Pupils: Pupils are equal, round, and reactive to light.  Neck:     Vascular: No carotid bruit.  Cardiovascular:     Rate and Rhythm: Normal rate and regular rhythm.     Pulses: Normal pulses.     Heart sounds: Normal heart sounds. No murmur heard.    No friction rub. No gallop.  Pulmonary:     Effort: Pulmonary effort is normal. No respiratory distress.     Breath sounds: Normal breath sounds. No wheezing, rhonchi or rales.  Chest:     Chest wall: No tenderness.  Abdominal:     General: Bowel sounds are normal. There is no distension.     Palpations: Abdomen is soft. There is no mass.     Tenderness: There is no abdominal tenderness. There is no right CVA tenderness, left CVA tenderness, guarding or rebound.  Musculoskeletal:        General: No swelling. Normal range of motion.     Cervical back: Normal range of motion. No rigidity or tenderness.     Lumbar back: Tenderness present. No swelling or spasms. Normal range of motion. Negative right straight leg raise test and negative left straight leg raise test.     Right lower leg: No edema.     Left lower leg: No edema.     Comments: Tender to palpation across lower back   Lymphadenopathy:     Cervical: No cervical adenopathy.  Skin:    General: Skin is warm and dry.     Coloration: Skin is not pale.     Findings: No bruising, erythema, lesion or rash.  Neurological:     Mental Status: She is alert and oriented to person, place, and time.     Cranial Nerves: No cranial nerve deficit.     Sensory: No sensory deficit.     Motor: No weakness.     Coordination: Coordination normal.     Gait: Gait normal.  Psychiatric:        Mood and Affect: Mood is anxious.        Speech: Speech normal.        Behavior: Behavior normal.        Thought Content: Thought content normal.        Judgment: Judgment normal.     Labs reviewed: Recent Labs    11/14/22 1012 05/22/23 0954  NA 143 143   K 4.5 4.4  CL 108 107  CO2 27 25  GLUCOSE 108* 107*  BUN 16 15  CREATININE 0.73 0.78  CALCIUM 9.8 9.5   Recent Labs    11/14/22 1012 05/22/23 0954  AST 24 23  ALT 14 13  BILITOT 0.5 0.6  PROT 6.7 6.7   Recent Labs    11/14/22 1012 05/22/23 0954  WBC 6.3 5.7  NEUTROABS 4,026 3,511  HGB 13.3 13.6  HCT 40.0 41.5  MCV 90.5 92.0  PLT 256 239   Lab Results  Component Value Date   TSH 0.61 05/22/2023   Lab Results  Component Value Date   HGBA1C 6.5 (H) 05/22/2023   Lab Results  Component Value Date   CHOL 149 05/22/2023   HDL 56 05/22/2023   LDLCALC 70 05/22/2023   TRIG 147 05/22/2023   CHOLHDL 2.7 05/22/2023    Significant Diagnostic Results in last 30 days:  No results found.  Assessment/Plan  1. Bilateral low back pain without sciatica, unspecified chronicity Bilateral tenderness across lower back on palpation.No spasm or bruise noted.will obtain X-ray to rule out acute abnormalities. Urine specimen to rule out UTI  - Urine Culture - DG Lumbar Spine Complete; Future - POC Urinalysis Dipstick indicates moderate leukocytes  but negative for blood and leukocytes.will send urine for culture.made aware results will be back in 3 days then will call with results and antibiotics if indicated.   2. Generalized anxiety disorder Klonopin 0.5 mg tablet prescribed on last visit but had drowsiness,lethargy and low blood pressure.D/w patient and POA to discontinue klonopin for now due to side effects.  Family/ staff Communication: Reviewed plan of care with patient verbalized understanding.   Labs/tests ordered:  - Urine Culture - DG Lumbar Spine Complete; Future - POC Urinalysis Dipstick  Next Appointment: Return if symptoms worsen or fail to improve.   Caesar Bookman, NP

## 2023-06-02 NOTE — Telephone Encounter (Signed)
[name] discussed during visit today.

## 2023-06-02 NOTE — Addendum Note (Signed)
Addended byRicharda Blade C on: 06/02/2023 02:59 PM   Modules accepted: Orders

## 2023-06-02 NOTE — Telephone Encounter (Signed)
Patient's daughter [name] called and left a message on clinical intake voicemail. She stated that patient was given generic Klonopin to take as needed,but she stated that patient has been taking it daily and late at night. She states that patient is more sluggish , slower and more tired than usual. She would like to know if they should stop the medication or space it out.  Message sent to Richarda Blade, NP

## 2023-06-02 NOTE — Patient Instructions (Signed)
-  Please get lower back X-ray at [place] at [facility] then will call you with results.

## 2023-06-05 ENCOUNTER — Other Ambulatory Visit: Payer: PPO

## 2023-06-05 ENCOUNTER — Other Ambulatory Visit: Payer: Self-pay | Admitting: Family

## 2023-06-05 DIAGNOSIS — N3000 Acute cystitis without hematuria: Secondary | ICD-10-CM

## 2023-06-05 DIAGNOSIS — M545 Low back pain, unspecified: Secondary | ICD-10-CM

## 2023-06-05 MED ORDER — SACCHAROMYCES BOULARDII 250 MG PO CAPS
250.0000 mg | ORAL_CAPSULE | Freq: Two times a day (BID) | ORAL | 0 refills | Status: AC
Start: 2023-06-05 — End: 2023-06-15

## 2023-06-05 MED ORDER — CIPROFLOXACIN HCL 500 MG PO TABS
500.0000 mg | ORAL_TABLET | Freq: Two times a day (BID) | ORAL | 0 refills | Status: AC
Start: 2023-06-05 — End: 2023-06-12

## 2023-06-05 NOTE — Progress Notes (Signed)
Cipro send to pharmacy

## 2023-06-05 NOTE — Telephone Encounter (Signed)
Jonni Sanger, CMA 06/05/2023 10:55 AM EDT Back to Top    Spoke with patients daughter when over the results she has read them through my chart. Is going to start her on the cirpro and will reach back out to [place] in symptoms worsen for retesting over the next week Nothing else follows at this time sent to pcp.   Donalee Citrin Ngetich, NP 06/04/2023 10:28 PM EDT     Final urine results indicate mixed flora possible contamination.  Recommend recollecting urine specimen for culture if still having any symptoms.If symptoms have worsen start on Start on Cipro 500 mg tablet one by mouth twice daily x 7 days Take along with probiotics Florastor 250 mg capsule one by mouth twice daily x 10 days to prevent antibiotics associated diarrhea    Patient daughter in [place], [place] called and stated that she would like to recollect the Urine Specimen with patient still showing symptoms.   Order placed for recollection and will bring patient in today to Lab to have recollected.

## 2023-06-13 DIAGNOSIS — Z95810 Presence of automatic (implantable) cardiac defibrillator: Secondary | ICD-10-CM | POA: Diagnosis not present

## 2023-06-13 DIAGNOSIS — I472 Ventricular tachycardia, unspecified: Secondary | ICD-10-CM | POA: Diagnosis not present

## 2023-06-13 DIAGNOSIS — I4821 Permanent atrial fibrillation: Secondary | ICD-10-CM | POA: Diagnosis not present

## 2023-06-13 DIAGNOSIS — I10 Essential (primary) hypertension: Secondary | ICD-10-CM | POA: Diagnosis not present

## 2023-06-13 DIAGNOSIS — Z7901 Long term (current) use of anticoagulants: Secondary | ICD-10-CM | POA: Diagnosis not present

## 2023-06-16 ENCOUNTER — Other Ambulatory Visit: Payer: Self-pay | Admitting: Family

## 2023-06-16 DIAGNOSIS — E039 Hypothyroidism, unspecified: Secondary | ICD-10-CM

## 2023-06-25 DIAGNOSIS — Z4502 Encounter for adjustment and management of automatic implantable cardiac defibrillator: Secondary | ICD-10-CM | POA: Diagnosis not present

## 2023-07-31 ENCOUNTER — Ambulatory Visit (INDEPENDENT_AMBULATORY_CARE_PROVIDER_SITE_OTHER): Payer: PPO | Admitting: Nurse Practitioner

## 2023-07-31 ENCOUNTER — Ambulatory Visit
Admission: RE | Admit: 2023-07-31 | Discharge: 2023-07-31 | Disposition: A | Discharge status: Home / Self Care | Payer: PPO | Source: Ambulatory Visit | Attending: Nurse Practitioner | Admitting: Nurse Practitioner

## 2023-07-31 ENCOUNTER — Encounter: Payer: Self-pay | Admitting: Nurse Practitioner

## 2023-07-31 VITALS — BP 126/80 | HR 74 | Temp 97.1°F | Ht 64.0 in | Wt 166.0 lb

## 2023-07-31 DIAGNOSIS — R0781 Pleurodynia: Secondary | ICD-10-CM

## 2023-07-31 DIAGNOSIS — M545 Low back pain, unspecified: Secondary | ICD-10-CM | POA: Diagnosis not present

## 2023-07-31 DIAGNOSIS — Z23 Encounter for immunization: Secondary | ICD-10-CM | POA: Diagnosis not present

## 2023-07-31 DIAGNOSIS — R079 Chest pain, unspecified: Secondary | ICD-10-CM | POA: Diagnosis not present

## 2023-07-31 DIAGNOSIS — E2839 Other primary ovarian failure: Secondary | ICD-10-CM

## 2023-07-31 NOTE — Progress Notes (Unsigned)
Careteam: Patient Care Team: Ngetich, Donalee Citrin, NP as PCP - General (Family Medicine) Venancio Poisson, MD as Consulting Physician (Dermatology) Osborn Coho, MD (Inactive) as Consulting Physician (Otolaryngology) Sandy Salaam, MD as Referring Physician (Cardiology)  PLACE OF SERVICE:  [facility]  Advanced Directive information    Allergies  Allergen Reactions   Codeine Nausea Only    Chief Complaint  Patient presents with   Acute Visit    Pain in rib area x 2 weeks (right side) and lower back area on the left since fall on Friday. Here with daughter, [name]. Discuss flu vaccine.      HPI: Patient is a 89 y.o. female presents for acute visit for right rib pain.  About 2 weeks ago she was leaning over to cut her toe nails and her right rib area started hurting. Pain radiates to right breast area. Deep breaths, cough, position changes aggravate this pain. Pain is about a 10/10 when it is triggered. If she's lying still it doesn't bother her. Has tried PRN tylenol and lidocaine patches. Has history of osteopenia, not on treatment, just takes calcium/vit. D. Last bone density many years ago.   She also had a fall on Friday. She was carrying some rugs and she lost her balance. Uses a cane but was not using it when she fell. She's now having pain at left lower back with position changes. Lives with her granddaughter.   Review of Systems:  Review of Systems  Respiratory:  Negative for cough and shortness of breath.   Cardiovascular:  Negative for chest pain.  Gastrointestinal:  Negative for constipation and diarrhea.       No loss of bowel control  Genitourinary:  Negative for dysuria, frequency and urgency.  Musculoskeletal:  Positive for back pain (left lower back).       Right rib pain  Neurological:  Positive for weakness.    Past Medical History:  Diagnosis Date   Acid reflux    Arthritis    Atrial fibrillation (HCC)    Breast cancer (HCC) 2011   Left Breast Cancer    Cancer [facility]) 2011   Left Breast Cancer   Depression    Hyperlipidemia    Hypertension    Insomnia    Osteoporosis    Pacemaker 2010   WIITH DEFIB   Pelvic fracture (HCC) 2010   Thyroid disease    Past Surgical History:  Procedure Laterality Date   ABDOMINAL HYSTERECTOMY  35 yrs ago   BREAST LUMPECTOMY Left 05/23/10   CARDIAC DEFIBRILLATOR PLACEMENT  2 years ago   CHOLECYSTECTOMY  50 YEARS AGO   ELBOW SURGERY  2006   Social History:   reports that she has never smoked. She has never used smokeless tobacco. She reports that she does not drink alcohol and does not use drugs.  Family History  Problem Relation Age of Onset   Heart disease Mother    Diabetes Father    Stroke Father    Cancer Sister        lung   Cancer Brother        liver   COPD Brother    Heart attack Brother    Stroke Brother    Heart attack Brother     Medications: Patient's Medications  New Prescriptions   No medications on file  Previous Medications   ACETAMINOPHEN (TYLENOL) 500 MG TABLET    Take 500 mg by mouth every 8 (eight) hours as needed for mild pain or  moderate pain.   ASCORBIC ACID (VITAMIN C) 1000 MG TABLET    Take 500 mg by mouth daily. 1/2 tablet in the morning and 1/2 tablet in the evening.   CALCIUM CARB-CHOLECALCIFEROL 600-800 MG-UNIT TABS    Take 1 tablet by mouth every morning.   CHOLECALCIFEROL (VITAMIN D3) 25 MCG (1000 UT) TABLET    Take 1,000 Units by mouth every morning.   DICLOFENAC SODIUM (VOLTAREN) 1 % GEL    Apply 2 g topically 4 (four) times daily as needed.   FUROSEMIDE (LASIX) 40 MG TABLET    Take 1 tablet (40 mg total) by mouth as needed.   GABAPENTIN (NEURONTIN) 600 MG TABLET    Take 1,200 mg by mouth at bedtime.   METOPROLOL SUCCINATE (TOPROL-XL) 25 MG 24 HR TABLET    Take 25 mg by mouth daily.    MULTIPLE VITAMINS-MINERALS (ABC PLUS PO)    Take 1 tablet by mouth daily.   OMEGA-3 1000 MG CAPS    Take 1 capsule by mouth daily.   PANTOPRAZOLE (PROTONIX) 40 MG TABLET     TAKE 1 TABLET BY MOUTH ONCE DAILY   PAROXETINE (PAXIL) 20 MG TABLET    TAKE 1 TABLET (20 MG TOTAL) BY MOUTH DAILY.   POTASSIUM CHLORIDE (MICRO-K) 10 MEQ CR CAPSULE    Take 1 capsule (10 mEq total) by mouth daily as needed. Along with furosemide   PRADAXA 75 MG CAPS CAPSULE    Take 75 mg by mouth 2 (two) times daily.    PRAVASTATIN (PRAVACHOL) 10 MG TABLET    TAKE 1 TABLET BY MOUTH DAILY.   PROBIOTIC PRODUCT (ALIGN) 4 MG CAPS    Take 1 capsule (4 mg total) by mouth daily.   SYNTHROID 88 MCG TABLET    TAKE 1 TABLET (88 MCG TOTAL) BY MOUTH DAILY BEFORE BREAKFAST ON EMPTY STOMACH   UNABLE TO FIND    Nutribiotic nasal spray  Modified Medications   No medications on file  Discontinued Medications   No medications on file    Physical Exam:  Vitals:   07/31/23 1356  BP: 126/80  Pulse: 74  Temp: (!) 97.1 F (36.2 C)  TempSrc: Temporal  SpO2: 98%  Weight: 75.3 kg  Height: 5\' 4"  (1.626 m)   Body mass index is 28.49 kg/m. Wt Readings from Last 3 Encounters:  07/31/23 75.3 kg  06/02/23 76.2 kg  05/25/23 78.5 kg    Physical Exam Cardiovascular:     Rate and Rhythm: Normal rate and regular rhythm.     Pulses: Normal pulses.     Heart sounds: Normal heart sounds.  Pulmonary:     Effort: Pulmonary effort is normal.     Breath sounds: Normal breath sounds.  Abdominal:     General: Bowel sounds are normal.     Palpations: Abdomen is soft.  Musculoskeletal:       Arms:     Comments: Tenderness in right rib area and left lower back Strength good bilateral lower extremities  Skin:    General: Skin is warm and dry.  Neurological:     Mental Status: She is alert. Mental status is at baseline.   ***  Labs reviewed: Basic Metabolic Panel: Recent Labs    11/14/22 1012 05/22/23 0954  NA 143 143  K 4.5 4.4  CL 108 107  CO2 27 25  GLUCOSE 108* 107*  BUN 16 15  CREATININE 0.73 0.78  CALCIUM 9.8 9.5  TSH 0.55 0.61   Liver Function  Tests: Recent Labs    11/14/22 1012  05/22/23 0954  AST 24 23  ALT 14 13  BILITOT 0.5 0.6  PROT 6.7 6.7   No results for input(s): "LIPASE", "AMYLASE" in the last 8760 hours. No results for input(s): "AMMONIA" in the last 8760 hours. CBC: Recent Labs    11/14/22 1012 05/22/23 0954  WBC 6.3 5.7  NEUTROABS 4,026 3,511  HGB 13.3 13.6  HCT 40.0 41.5  MCV 90.5 92.0  PLT 256 239   Lipid Panel: Recent Labs    11/14/22 1012 05/22/23 0954  CHOL 155 149  HDL 55 56  LDLCALC 77 70  TRIG 124 147  CHOLHDL 2.8 2.7   TSH: Recent Labs    11/14/22 1012 05/22/23 0954  TSH 0.55 0.61   A1C: Lab Results  Component Value Date   HGBA1C 6.5 (H) 05/22/2023     Assessment/Plan 1. Estrogen deficiency - DG Bone Density; Future  2. Rib pain - DG Chest 2 View; Future -Continue PRN tylenol, no more than 3g per 24 hours. Use heat as well.   3. Acute left-sided low back pain without sciatica - DG Lumbar Spine Complete; Future -Continue PRN tylenol, no more than 3g per 24 hours. Use heat as well.  -Fall precautions -Use cane/walker at all times   No follow-ups on file.: ***  Rollen Sox, FNP-MSN Student *** Abbey Chatters, NP

## 2023-07-31 NOTE — Patient Instructions (Addendum)
Okay to use tylenol 500 mg 1-2 tablets every 4-8 hours Can take 1 tablet every 4 or  2 tablets every 8 hours.   To use heating pad as needed

## 2023-08-02 ENCOUNTER — Encounter: Payer: Self-pay | Admitting: Nurse Practitioner

## 2023-08-03 ENCOUNTER — Ambulatory Visit: Payer: PPO | Admitting: Nurse Practitioner

## 2023-08-03 ENCOUNTER — Encounter: Payer: Self-pay | Admitting: Nurse Practitioner

## 2023-08-04 ENCOUNTER — Encounter: Payer: PPO | Admitting: Nurse Practitioner

## 2023-08-08 NOTE — Progress Notes (Signed)
This encounter was created in error - please disregard.

## 2023-08-11 ENCOUNTER — Encounter: Payer: Self-pay | Admitting: Nurse Practitioner

## 2023-08-11 NOTE — Telephone Encounter (Signed)
Message routed to PCP Ngetich, Dinah C, NP  

## 2023-08-16 ENCOUNTER — Encounter: Payer: Self-pay | Admitting: Family

## 2023-08-16 ENCOUNTER — Ambulatory Visit: Payer: PPO | Admitting: Family

## 2023-08-16 DIAGNOSIS — Z0000 Encounter for general adult medical examination without abnormal findings: Secondary | ICD-10-CM

## 2023-08-16 NOTE — Patient Instructions (Signed)
[name] , Thank you for taking time to come for your Medicare Wellness Visit. I appreciate your ongoing commitment to your health goals. Please review the following plan we discussed and let me know if I can assist you in the future.   Screening recommendations/referrals: Colonoscopy : N/A  Mammogram : Up to date  Bone Density : Ordered 07/31/2023  Recommended yearly ophthalmology/optometry visit for glaucoma screening and checkup Recommended yearly dental visit for hygiene and checkup  Vaccinations: Influenza vaccine- due annually in September/October Pneumococcal vaccine : Up to date  Tdap vaccine : Up to date  Shingles vaccine : Up to date     Advanced directives: Yes   Conditions/risks identified: advanced age (>11men, >23 women);hypertension;sedentary lifestyle  Next appointment: 1 year    Preventive Care 87 Years and Older, Female Preventive care refers to lifestyle choices and visits with your health care provider that can promote health and wellness. What does preventive care include? A yearly physical exam. This is also called an annual well check. Dental exams once or twice a year. Routine eye exams. Ask your health care provider how often you should have your eyes checked. Personal lifestyle choices, including: Daily care of your teeth and gums. Regular physical activity. Eating a healthy diet. Avoiding tobacco and drug use. Limiting alcohol use. Practicing safe sex. Taking low-dose aspirin every day. Taking vitamin and mineral supplements as recommended by your health care provider. What happens during an annual well check? The services and screenings done by your health care provider during your annual well check will depend on your age, overall health, lifestyle risk factors, and family history of disease. Counseling  Your health care provider may ask you questions about your: Alcohol use. Tobacco use. Drug use. Emotional well-being. Home and relationship  well-being. Sexual activity. Eating habits. History of falls. Memory and ability to understand (cognition). Work and work Astronomer. Reproductive health. Screening  You may have the following tests or measurements: Height, weight, and BMI. Blood pressure. Lipid and cholesterol levels. These may be checked every 5 years, or more frequently if you are over 13 years old. Skin check. Lung cancer screening. You may have this screening every year starting at age 7 if you have a 30-pack-year history of smoking and currently smoke or have quit within the past 15 years. Fecal occult blood test (FOBT) of the stool. You may have this test every year starting at age 68. Flexible sigmoidoscopy or colonoscopy. You may have a sigmoidoscopy every 5 years or a colonoscopy every 10 years starting at age 3. Hepatitis C blood test. Hepatitis B blood test. Sexually transmitted disease (STD) testing. Diabetes screening. This is done by checking your blood sugar (glucose) after you have not eaten for a while (fasting). You may have this done every 1-3 years. Bone density scan. This is done to screen for osteoporosis. You may have this done starting at age 53. Mammogram. This may be done every 1-2 years. Talk to your health care provider about how often you should have regular mammograms. Talk with your health care provider about your test results, treatment options, and if necessary, the need for more tests. Vaccines  Your health care provider may recommend certain vaccines, such as: Influenza vaccine. This is recommended every year. Tetanus, diphtheria, and acellular pertussis (Tdap, Td) vaccine. You may need a Td booster every 10 years. Zoster vaccine. You may need this after age 46. Pneumococcal 13-valent conjugate (PCV13) vaccine. One dose is recommended after age 16. Pneumococcal polysaccharide (  PPSV23) vaccine. One dose is recommended after age 34. Talk to your health care provider about which  screenings and vaccines you need and how often you need them. This information is not intended to replace advice given to you by your health care provider. Make sure you discuss any questions you have with your health care provider. Document Released: 11/20/2015 Document Revised: 07/13/2016 Document Reviewed: 08/25/2015 Elsevier Interactive Patient Education  2017 [employer].  Fall Prevention in the Home Falls can cause injuries. They can happen to people of all ages. There are many things you can do to make your home safe and to help prevent falls. What can I do on the outside of my home? Regularly fix the edges of walkways and driveways and fix any cracks. Remove anything that might make you trip as you walk through a door, such as a raised step or threshold. Trim any bushes or trees on the path to your home. Use bright outdoor lighting. Clear any walking paths of anything that might make someone trip, such as rocks or tools. Regularly check to see if handrails are loose or broken. Make sure that both sides of any steps have handrails. Any raised decks and porches should have guardrails on the edges. Have any leaves, snow, or ice cleared regularly. Use sand or salt on walking paths during winter. Clean up any spills in your garage right away. This includes oil or grease spills. What can I do in the bathroom? Use night lights. Install grab bars by the toilet and in the tub and shower. Do not use towel bars as grab bars. Use non-skid mats or decals in the tub or shower. If you need to sit down in the shower, use a plastic, non-slip stool. Keep the floor dry. Clean up any water that spills on the floor as soon as it happens. Remove soap buildup in the tub or shower regularly. Attach bath mats securely with double-sided non-slip rug tape. Do not have throw rugs and other things on the floor that can make you trip. What can I do in the bedroom? Use night lights. Make sure that you have a  light by your bed that is easy to reach. Do not use any sheets or blankets that are too big for your bed. They should not hang down onto the floor. Have a firm chair that has side arms. You can use this for support while you get dressed. Do not have throw rugs and other things on the floor that can make you trip. What can I do in the kitchen? Clean up any spills right away. Avoid walking on wet floors. Keep items that you use a lot in easy-to-reach places. If you need to reach something above you, use a strong step stool that has a grab bar. Keep electrical cords out of the way. Do not use floor polish or wax that makes floors slippery. If you must use wax, use non-skid floor wax. Do not have throw rugs and other things on the floor that can make you trip. What can I do with my stairs? Do not leave any items on the stairs. Make sure that there are handrails on both sides of the stairs and use them. Fix handrails that are broken or loose. Make sure that handrails are as long as the stairways. Check any carpeting to make sure that it is firmly attached to the stairs. Fix any carpet that is loose or worn. Avoid having throw rugs at the top or  bottom of the stairs. If you do have throw rugs, attach them to the floor with carpet tape. Make sure that you have a light switch at the top of the stairs and the bottom of the stairs. If you do not have them, ask someone to add them for you. What else can I do to help prevent falls? Wear shoes that: Do not have high heels. Have rubber bottoms. Are comfortable and fit you well. Are closed at the toe. Do not wear sandals. If you use a stepladder: Make sure that it is fully opened. Do not climb a closed stepladder. Make sure that both sides of the stepladder are locked into place. Ask someone to hold it for you, if possible. Clearly mark and make sure that you can see: Any grab bars or handrails. First and last steps. Where the edge of each step  is. Use tools that help you move around (mobility aids) if they are needed. These include: Canes. Walkers. Scooters. Crutches. Turn on the lights when you go into a dark area. Replace any light bulbs as soon as they burn out. Set up your furniture so you have a clear path. Avoid moving your furniture around. If any of your floors are uneven, fix them. If there are any pets around you, be aware of where they are. Review your medicines with your doctor. Some medicines can make you feel dizzy. This can increase your chance of falling. Ask your doctor what other things that you can do to help prevent falls. This information is not intended to replace advice given to you by your health care provider. Make sure you discuss any questions you have with your health care provider. Document Released: 08/20/2009 Document Revised: 03/31/2016 Document Reviewed: 11/28/2014 Elsevier Interactive Patient Education  2017 [employer].

## 2023-08-16 NOTE — Progress Notes (Signed)
Subjective:   [name] is a 89 y.o. female who presents for Medicare Annual (Subsequent) preventive examination.  Visit Complete: Virtual I connected with  [name] on 08/16/23 by a video and audio enabled telemedicine application and verified that I am speaking with the correct person using two identifiers.  Patient Location: Home  Provider Location: Office/Clinic  I discussed the limitations of evaluation and management by telemedicine. The patient expressed understanding and agreed to proceed.  Vital Signs: Because this visit was a virtual/telehealth visit, some criteria may be missing or patient reported. Any vitals not documented were not able to be obtained and vitals that have been documented are patient reported.  Patient Medicare AWV questionnaire was completed by the patient on 08/16/2023; I have confirmed that all information answered by patient is correct and no changes since this date.  Cardiac Risk Factors include: advanced age (>51men, >53 women);hypertension;sedentary lifestyle     Objective:    There were no vitals filed for this visit. There is no height or weight on file to calculate BMI.     08/03/2023    3:57 PM 05/25/2023    9:01 AM 11/17/2022    2:54 PM 08/12/2022    3:41 PM 05/19/2022    1:04 PM 11/03/2021    1:19 PM 11/03/2021   11:55 AM  Advanced Directives  Does Patient Have a Medical Advance Directive? Yes Yes Yes Yes Yes Yes Yes  Type of Advance Directive Out of facility DNR (pink MOST or yellow form) Out of facility DNR (pink MOST or yellow form) Healthcare Power of [place];Living will;Out of facility DNR (pink MOST or yellow form) Healthcare Power of [place];Living will;Out of facility DNR (pink MOST or yellow form) Healthcare Power of [place];Out of facility DNR (pink MOST or yellow form) Out of facility DNR (pink MOST or yellow form) Out of facility DNR (pink MOST or yellow form)  Does patient want to make changes to medical advance  directive? No - Patient declined No - Patient declined No - Patient declined No - Patient declined No - Patient declined No - Patient declined No - Patient declined  Copy of Healthcare Power of Attorney in Chart?   Yes - validated most recent copy scanned in chart (See row information) No - copy requested No - copy requested    Pre-existing out of facility DNR order (yellow form or pink MOST form) Pink MOST form placed in chart (order not valid for inpatient use) Pink MOST form placed in chart (order not valid for inpatient use);Yellow form placed in chart (order not valid for inpatient use)     Pink MOST form placed in chart (order not valid for inpatient use)    Current Medications (verified) Outpatient Encounter Medications as of 08/16/2023  Medication Sig   acetaminophen (TYLENOL) 500 MG tablet Take 500 mg by mouth every 8 (eight) hours as needed for mild pain or moderate pain.   Ascorbic Acid (VITAMIN C) 1000 MG tablet Take 500 mg by mouth daily. 1/2 tablet in the morning and 1/2 tablet in the evening.   Calcium Carb-Cholecalciferol 600-800 MG-UNIT TABS Take 1 tablet by mouth every morning.   cholecalciferol (VITAMIN D3) 25 MCG (1000 UT) tablet Take 1,000 Units by mouth every morning.   diclofenac Sodium (VOLTAREN) 1 % GEL Apply 2 g topically 4 (four) times daily as needed.   furosemide (LASIX) 40 MG tablet Take 1 tablet (40 mg total) by mouth as needed.   gabapentin (NEURONTIN) 600 MG  tablet Take 1,200 mg by mouth at bedtime.   metoprolol succinate (TOPROL-XL) 25 MG 24 hr tablet Take 25 mg by mouth daily.    Multiple Vitamins-Minerals (ABC PLUS PO) Take 1 tablet by mouth daily.   Omega-3 1000 MG CAPS Take 1 capsule by mouth daily.   pantoprazole (PROTONIX) 40 MG tablet TAKE 1 TABLET BY MOUTH ONCE DAILY   PARoxetine (PAXIL) 20 MG tablet TAKE 1 TABLET (20 MG TOTAL) BY MOUTH DAILY.   potassium chloride (MICRO-K) 10 MEQ CR capsule Take 1 capsule (10 mEq total) by mouth daily as needed. Along  with furosemide   PRADAXA 75 MG CAPS capsule Take 75 mg by mouth 2 (two) times daily.    pravastatin (PRAVACHOL) 10 MG tablet TAKE 1 TABLET BY MOUTH DAILY.   Probiotic Product (ALIGN) 4 MG CAPS Take 1 capsule (4 mg total) by mouth daily.   UNABLE TO FIND Nutribiotic nasal spray   SYNTHROID 88 MCG tablet TAKE 1 TABLET (88 MCG TOTAL) BY MOUTH DAILY BEFORE BREAKFAST ON EMPTY STOMACH   No facility-administered encounter medications on file as of 08/16/2023.    Allergies (verified) Codeine   History: Past Medical History:  Diagnosis Date   Acid reflux    Arthritis    Atrial fibrillation (HCC)    Breast cancer (HCC) 2011   Left Breast Cancer   Cancer [facility]) 2011   Left Breast Cancer   Depression    Hyperlipidemia    Hypertension    Insomnia    Osteoporosis    Pacemaker 2010   WIITH DEFIB   Pelvic fracture (HCC) 2010   Thyroid disease    Past Surgical History:  Procedure Laterality Date   ABDOMINAL HYSTERECTOMY  35 yrs ago   BREAST LUMPECTOMY Left 05/23/10   CARDIAC DEFIBRILLATOR PLACEMENT  2 years ago   CHOLECYSTECTOMY  50 YEARS AGO   ELBOW SURGERY  2006   Family History  Problem Relation Age of Onset   Heart disease Mother    Diabetes Father    Stroke Father    Cancer Sister        lung   Cancer Brother        liver   COPD Brother    Heart attack Brother    Stroke Brother    Heart attack Brother    Social History   Socioeconomic History   Marital status: Widowed    Spouse name: Not on file   Number of children: Not on file   Years of education: Not on file   Highest education level: Not on file  Occupational History   Not on file  Tobacco Use   Smoking status: Never   Smokeless tobacco: Never  Vaping Use   Vaping status: Never Used  Substance and Sexual Activity   Alcohol use: No   Drug use: Never   Sexual activity: Not on file  Other Topics Concern   Not on file  Social History Narrative   Social History      Diet? None       Do you drink/eat  things with caffeine? yes      Marital status?             Widowed                        What year were you married? 1946      Do you live in a house, apartment, assisted living, condo, trailer, etc.? house  Is it one or more stories?   2      How many persons live in your home?  3 others       Do you have any pets in your home? (please list)   2 dogs      Highest level of education completed? Junior high school ?      Current or past profession: N/A      Do you exercise?                   No                    Type & how often?        Advanced Directives      Do you have a living will? Yes       Do you have a DNR form?                                  If not, do you want to discuss one?      Do you have signed POA/HPOA for forms? Yes      Functional Status      Do you have difficulty bathing or dressing yourself?  No       Do you have difficulty preparing food or eating? no      Do you have difficulty managing your medications?  Yes      Do you have difficulty managing your finances? Yes       Do you have difficulty affording your medications?  no      Social Determinants of Health   Financial Resource Strain: Not on file  Food Insecurity: Not on file  Transportation Needs: Not on file  Physical Activity: Not on file  Stress: Not on file  Social Connections: Not on file    Tobacco Counseling Counseling given: Not Answered   Clinical Intake:  Pre-visit preparation completed: No  Pain : No/denies pain     Nutritional Risks: None Diabetes: No  How often do you need to have someone help you when you read instructions, pamphlets, or other written materials from your doctor or pharmacy?: 5 - Always (grand daughter) What is the last grade level you completed in school?: 3rd grade  Interpreter Needed?: No      Activities of Daily Living    08/16/2023    3:49 PM  In your present state of health, do you have any difficulty performing the  following activities:  Hearing? 1  Vision? 1  Difficulty concentrating or making decisions? 1  Comment making decision  Walking or climbing stairs? 1  Comment uses a walker  Dressing or bathing? 0  Doing errands, shopping? 1  Comment Grand daughter  Quarry manager and eating ? Y  Comment grand daughter  Using the Toilet? N  In the past six months, have you accidently leaked urine? N  Do you have problems with loss of bowel control? N  Managing your Medications? Y  Comment grand daughter  Managing your Finances? Y  Comment daughter  Housekeeping or managing your Housekeeping? Y  Comment grand daughter    Patient Care Team: Montavis Schubring, Donalee Citrin, NP as PCP - General (Family Medicine) Venancio Poisson, MD as Consulting Physician (Dermatology) Osborn Coho, MD (Inactive) as Consulting Physician (Otolaryngology) Sandy Salaam, MD as Referring Physician (Cardiology)  Indicate any recent Medical Services you may have received  from other than [facility] providers in the past year (date may be approximate).     Assessment:   This is a routine wellness examination for [place].  Hearing/Vision screen No results found.   Goals Addressed             This Visit's Progress    Patient Stated       Stay as long to help others        Depression Screen    08/16/2023    3:03 PM 12/19/2022   12:53 PM 08/12/2022    3:34 PM 07/23/2021    3:53 PM 07/07/2020    1:06 PM 12/11/2019    1:27 PM 12/02/2019    4:04 PM  PHQ 2/9 Scores  PHQ - 2 Score 0 0 0 0  0 0  Exception Documentation     Other- indicate reason in comment box      Fall Risk    08/16/2023    3:03 PM 07/31/2023    2:07 PM 05/25/2023    9:02 AM 12/19/2022   12:53 PM 11/17/2022    2:54 PM  Fall Risk   Falls in the past year? 1 1 0 0 0  Number falls in past yr: 1 0 0 0 0  Injury with Fall? 0 1 0 0 0  Risk for fall due to :  Impaired balance/gait No Fall Risks No Fall Risks No Fall Risks  Follow up  Falls evaluation completed Falls  evaluation completed Falls evaluation completed Falls evaluation completed    MEDICARE RISK AT HOME: Medicare Risk at Home Any stairs in or around the home?: Yes If so, are there any without handrails?: No Home free of loose throw rugs in walkways, pet beds, electrical cords, etc?: No Adequate lighting in your home to reduce risk of falls?: Yes Life alert?: Yes Use of a cane, walker or w/c?: Yes Grab bars in the bathroom?: No Shower chair or bench in shower?: Yes Elevated toilet seat or a handicapped toilet?: Yes  TIMED UP AND GO:  Was the test performed?  No    Cognitive Function:        08/16/2023    3:03 PM 08/12/2022    3:35 PM 07/23/2021    3:59 PM  6CIT Screen  What Year? 4 points 4 points 0 points  What month? 0 points 0 points 0 points  What time? 0 points 0 points 0 points  Count back from 20 0 points 4 points 0 points  Months in reverse 2 points 4 points 4 points  Repeat phrase 8 points 4 points 6 points  Total Score 14 points 16 points 10 points    Immunizations Immunization History  Administered Date(s) Administered   Fluad Quad(high Dose 65+) 08/01/2019, 08/17/2020, 08/29/2022   Fluad Trivalent(High Dose 65+) 07/31/2023   Influenza, High Dose Seasonal PF 07/31/2018, 08/21/2021   Influenza-Unspecified 06/12/2015   Moderna Sars-Covid-2 Vaccination 03/20/2021   PFIZER(Purple Top)SARS-COV-2 Vaccination 12/16/2019, 01/09/2020, 08/11/2020, 03/20/2021   Pneumococcal Conjugate-13 12/23/2016   Pneumococcal Polysaccharide-23 10/12/2003   Tdap 12/25/2017   Zoster Recombinant(Shingrix) 06/23/2021, 10/16/2021    TDAP status: Up to date  Flu Vaccine status: Up to date  Pneumococcal vaccine status: Up to date  Covid-19 vaccine status: Information provided on how to obtain vaccines.   Qualifies for Shingles Vaccine? Yes   Zostavax completed No   Shingrix Completed?: Yes  Screening Tests Health Maintenance  Topic Date Due   COVID-19 Vaccine (6 - 2023-24  season) 07/09/2023  Medicare Annual Wellness (AWV)  08/15/2024   DTaP/Tdap/Td (2 - Td or Tdap) 12/26/2027   Pneumonia Vaccine 20+ Years old  Completed   INFLUENZA VACCINE  Completed   DEXA SCAN  Completed   Zoster Vaccines- Shingrix  Completed   HPV VACCINES  Aged Out    Health Maintenance  Health Maintenance Due  Topic Date Due   COVID-19 Vaccine (6 - 2023-24 season) 07/09/2023    Colorectal cancer screening: No longer required.   Mammogram status: No longer required due to advance age .  Bone Density status: Ordered 07/31/2023. Pt provided with contact info and advised to call to schedule appt.  Lung Cancer Screening: (Low Dose CT Chest recommended if Age 67-80 years, 20 pack-year currently smoking OR have quit w/in 15years.) does not qualify.   Lung Cancer Screening Referral: No   Additional Screening:  Hepatitis C Screening: does not qualify; Completed N/A   Vision Screening: Recommended annual ophthalmology exams for early detection of glaucoma and other disorders of the eye. Is the patient up to date with their annual eye exam?  Yes  Who is the provider or what is the name of the office in which the patient attends annual eye exams? Dr.Tanner  If pt is not established with a provider, would they like to be referred to a provider to establish care? No .   Dental Screening: Recommended annual dental exams for proper oral hygiene  Diabetic Foot Exam: Diabetic Foot Exam: Completed N/A   Community Resource Referral / Chronic Care Management: CRR required this visit?  No   CCM required this visit?  No     Plan:     I have personally reviewed and noted the following in the patient's chart:   Medical and social history Use of alcohol, tobacco or illicit drugs  Current medications and supplements including opioid prescriptions. Patient is not currently taking opioid prescriptions. Functional ability and status Nutritional status Physical activity Advanced  directives List of other physicians Hospitalizations, surgeries, and ER visits in previous 12 months Vitals Screenings to include cognitive, depression, and falls Referrals and appointments  In addition, I have reviewed and discussed with patient certain preventive protocols, quality metrics, and best practice recommendations. A written personalized care plan for preventive services as well as general preventive health recommendations were provided to patient.     Caesar Bookman, NP   08/16/2023   After Visit Summary: (MyChart) Due to this being a telephonic visit, the after visit summary with patients personalized plan was offered to patient via MyChart   Nurse Notes: Advised to get COVID-19 vaccine at the pharmacy

## 2023-10-04 ENCOUNTER — Other Ambulatory Visit: Payer: Self-pay | Admitting: Family

## 2023-10-04 DIAGNOSIS — E785 Hyperlipidemia, unspecified: Secondary | ICD-10-CM

## 2023-10-04 NOTE — Telephone Encounter (Signed)
High risk or very high risk warning populated when attempting to refill paroxetine. RX request sent to PCP for review and approval if warranted.

## 2023-10-26 ENCOUNTER — Other Ambulatory Visit: Payer: Self-pay | Admitting: Family

## 2023-10-26 NOTE — Telephone Encounter (Signed)
Patient is requesting a refill of the following medications: Requested Prescriptions   Pending Prescriptions Disp Refills   gabapentin (NEURONTIN) 600 MG tablet [Pharmacy Med Name: GABAPENTIN 600 MG TAB 600 Tablet] 60 tablet 5    Sig: TAKE 1 TABLET BY MOUTH TWICE A DAY AS NEEDED   Patient would like a refill on the medication below but it was prescribed by a historical provider. Is this current refill appropriate

## 2023-10-30 ENCOUNTER — Other Ambulatory Visit: Payer: Self-pay | Admitting: Family

## 2023-10-30 DIAGNOSIS — H0100B Unspecified blepharitis left eye, upper and lower eyelids: Secondary | ICD-10-CM | POA: Diagnosis not present

## 2023-10-30 DIAGNOSIS — H3500 Unspecified background retinopathy: Secondary | ICD-10-CM | POA: Diagnosis not present

## 2023-10-30 DIAGNOSIS — H353131 Nonexudative age-related macular degeneration, bilateral, early dry stage: Secondary | ICD-10-CM | POA: Diagnosis not present

## 2023-10-30 DIAGNOSIS — H43813 Vitreous degeneration, bilateral: Secondary | ICD-10-CM | POA: Diagnosis not present

## 2023-10-30 DIAGNOSIS — H26491 Other secondary cataract, right eye: Secondary | ICD-10-CM | POA: Diagnosis not present

## 2023-10-30 DIAGNOSIS — H04123 Dry eye syndrome of bilateral lacrimal glands: Secondary | ICD-10-CM | POA: Diagnosis not present

## 2023-10-30 DIAGNOSIS — H524 Presbyopia: Secondary | ICD-10-CM | POA: Diagnosis not present

## 2023-10-30 DIAGNOSIS — H52203 Unspecified astigmatism, bilateral: Secondary | ICD-10-CM | POA: Diagnosis not present

## 2023-10-30 DIAGNOSIS — H0100A Unspecified blepharitis right eye, upper and lower eyelids: Secondary | ICD-10-CM | POA: Diagnosis not present

## 2023-11-17 ENCOUNTER — Other Ambulatory Visit: Payer: Self-pay

## 2023-11-17 DIAGNOSIS — R7303 Prediabetes: Secondary | ICD-10-CM

## 2023-11-17 DIAGNOSIS — I10 Essential (primary) hypertension: Secondary | ICD-10-CM

## 2023-11-17 DIAGNOSIS — E782 Mixed hyperlipidemia: Secondary | ICD-10-CM

## 2023-11-17 DIAGNOSIS — I482 Chronic atrial fibrillation, unspecified: Secondary | ICD-10-CM

## 2023-11-17 DIAGNOSIS — E2839 Other primary ovarian failure: Secondary | ICD-10-CM

## 2023-11-21 ENCOUNTER — Inpatient Hospital Stay (HOSPITAL_COMMUNITY)
Admission: EM | Admit: 2023-11-21 | Discharge: 2023-11-29 | DRG: 542 | Disposition: A | Discharge status: Skilled Nursing Facility | Payer: PPO | Attending: Internal Medicine | Admitting: Internal Medicine

## 2023-11-21 ENCOUNTER — Emergency Department (HOSPITAL_COMMUNITY): Payer: PPO

## 2023-11-21 ENCOUNTER — Other Ambulatory Visit: Payer: Self-pay

## 2023-11-21 ENCOUNTER — Encounter (HOSPITAL_COMMUNITY): Payer: Self-pay

## 2023-11-21 DIAGNOSIS — W010XXA Fall on same level from slipping, tripping and stumbling without subsequent striking against object, initial encounter: Secondary | ICD-10-CM | POA: Diagnosis present

## 2023-11-21 DIAGNOSIS — Z800 Family history of malignant neoplasm of digestive organs: Secondary | ICD-10-CM

## 2023-11-21 DIAGNOSIS — M8008XA Age-related osteoporosis with current pathological fracture, vertebra(e), initial encounter for fracture: Secondary | ICD-10-CM | POA: Diagnosis not present

## 2023-11-21 DIAGNOSIS — I482 Chronic atrial fibrillation, unspecified: Secondary | ICD-10-CM | POA: Diagnosis present

## 2023-11-21 DIAGNOSIS — Z801 Family history of malignant neoplasm of trachea, bronchus and lung: Secondary | ICD-10-CM

## 2023-11-21 DIAGNOSIS — S4982XA Other specified injuries of left shoulder and upper arm, initial encounter: Secondary | ICD-10-CM | POA: Diagnosis not present

## 2023-11-21 DIAGNOSIS — J942 Hemothorax: Secondary | ICD-10-CM | POA: Diagnosis present

## 2023-11-21 DIAGNOSIS — Z885 Allergy status to narcotic agent status: Secondary | ICD-10-CM

## 2023-11-21 DIAGNOSIS — S272XXA Traumatic hemopneumothorax, initial encounter: Secondary | ICD-10-CM | POA: Diagnosis present

## 2023-11-21 DIAGNOSIS — R296 Repeated falls: Secondary | ICD-10-CM | POA: Diagnosis present

## 2023-11-21 DIAGNOSIS — Z833 Family history of diabetes mellitus: Secondary | ICD-10-CM

## 2023-11-21 DIAGNOSIS — R6510 Systemic inflammatory response syndrome (SIRS) of non-infectious origin without acute organ dysfunction: Secondary | ICD-10-CM | POA: Diagnosis not present

## 2023-11-21 DIAGNOSIS — J939 Pneumothorax, unspecified: Secondary | ICD-10-CM | POA: Diagnosis not present

## 2023-11-21 DIAGNOSIS — S2242XD Multiple fractures of ribs, left side, subsequent encounter for fracture with routine healing: Secondary | ICD-10-CM | POA: Diagnosis not present

## 2023-11-21 DIAGNOSIS — I10 Essential (primary) hypertension: Secondary | ICD-10-CM | POA: Diagnosis not present

## 2023-11-21 DIAGNOSIS — I495 Sick sinus syndrome: Secondary | ICD-10-CM | POA: Diagnosis present

## 2023-11-21 DIAGNOSIS — E039 Hypothyroidism, unspecified: Secondary | ICD-10-CM | POA: Diagnosis present

## 2023-11-21 DIAGNOSIS — M800AXA Age-related osteoporosis with current pathological fracture, other site, initial encounter for fracture: Secondary | ICD-10-CM | POA: Diagnosis present

## 2023-11-21 DIAGNOSIS — S2242XA Multiple fractures of ribs, left side, initial encounter for closed fracture: Secondary | ICD-10-CM

## 2023-11-21 DIAGNOSIS — Z823 Family history of stroke: Secondary | ICD-10-CM

## 2023-11-21 DIAGNOSIS — K219 Gastro-esophageal reflux disease without esophagitis: Secondary | ICD-10-CM | POA: Diagnosis not present

## 2023-11-21 DIAGNOSIS — Z79890 Hormone replacement therapy (postmenopausal): Secondary | ICD-10-CM

## 2023-11-21 DIAGNOSIS — E785 Hyperlipidemia, unspecified: Secondary | ICD-10-CM | POA: Diagnosis present

## 2023-11-21 DIAGNOSIS — R918 Other nonspecific abnormal finding of lung field: Secondary | ICD-10-CM | POA: Diagnosis not present

## 2023-11-21 DIAGNOSIS — I2510 Atherosclerotic heart disease of native coronary artery without angina pectoris: Secondary | ICD-10-CM | POA: Diagnosis present

## 2023-11-21 DIAGNOSIS — Z9049 Acquired absence of other specified parts of digestive tract: Secondary | ICD-10-CM

## 2023-11-21 DIAGNOSIS — S22030A Wedge compression fracture of third thoracic vertebra, initial encounter for closed fracture: Secondary | ICD-10-CM | POA: Diagnosis not present

## 2023-11-21 DIAGNOSIS — Z66 Do not resuscitate: Secondary | ICD-10-CM | POA: Diagnosis present

## 2023-11-21 DIAGNOSIS — Z043 Encounter for examination and observation following other accident: Secondary | ICD-10-CM | POA: Diagnosis not present

## 2023-11-21 DIAGNOSIS — Z825 Family history of asthma and other chronic lower respiratory diseases: Secondary | ICD-10-CM

## 2023-11-21 DIAGNOSIS — S22000A Wedge compression fracture of unspecified thoracic vertebra, initial encounter for closed fracture: Secondary | ICD-10-CM

## 2023-11-21 DIAGNOSIS — M1990 Unspecified osteoarthritis, unspecified site: Secondary | ICD-10-CM | POA: Diagnosis present

## 2023-11-21 DIAGNOSIS — S22020A Wedge compression fracture of second thoracic vertebra, initial encounter for closed fracture: Secondary | ICD-10-CM | POA: Diagnosis not present

## 2023-11-21 DIAGNOSIS — M47816 Spondylosis without myelopathy or radiculopathy, lumbar region: Secondary | ICD-10-CM | POA: Diagnosis not present

## 2023-11-21 DIAGNOSIS — S22009A Unspecified fracture of unspecified thoracic vertebra, initial encounter for closed fracture: Secondary | ICD-10-CM | POA: Diagnosis not present

## 2023-11-21 DIAGNOSIS — I700 Atherosclerosis of aorta: Secondary | ICD-10-CM | POA: Diagnosis not present

## 2023-11-21 DIAGNOSIS — Z853 Personal history of malignant neoplasm of breast: Secondary | ICD-10-CM

## 2023-11-21 DIAGNOSIS — Z90710 Acquired absence of both cervix and uterus: Secondary | ICD-10-CM

## 2023-11-21 DIAGNOSIS — R609 Edema, unspecified: Secondary | ICD-10-CM | POA: Diagnosis not present

## 2023-11-21 DIAGNOSIS — R079 Chest pain, unspecified: Secondary | ICD-10-CM | POA: Diagnosis not present

## 2023-11-21 DIAGNOSIS — Z7901 Long term (current) use of anticoagulants: Secondary | ICD-10-CM

## 2023-11-21 DIAGNOSIS — K5900 Constipation, unspecified: Secondary | ICD-10-CM | POA: Diagnosis not present

## 2023-11-21 DIAGNOSIS — E782 Mixed hyperlipidemia: Secondary | ICD-10-CM | POA: Diagnosis not present

## 2023-11-21 DIAGNOSIS — F32A Depression, unspecified: Secondary | ICD-10-CM | POA: Diagnosis not present

## 2023-11-21 DIAGNOSIS — Z7902 Long term (current) use of antithrombotics/antiplatelets: Secondary | ICD-10-CM

## 2023-11-21 DIAGNOSIS — S2249XA Multiple fractures of ribs, unspecified side, initial encounter for closed fracture: Secondary | ICD-10-CM | POA: Diagnosis present

## 2023-11-21 DIAGNOSIS — Y92012 Bathroom of single-family (private) house as the place of occurrence of the external cause: Secondary | ICD-10-CM

## 2023-11-21 DIAGNOSIS — W19XXXA Unspecified fall, initial encounter: Secondary | ICD-10-CM | POA: Diagnosis not present

## 2023-11-21 DIAGNOSIS — S36039A Unspecified laceration of spleen, initial encounter: Secondary | ICD-10-CM | POA: Diagnosis not present

## 2023-11-21 DIAGNOSIS — E875 Hyperkalemia: Secondary | ICD-10-CM | POA: Diagnosis present

## 2023-11-21 DIAGNOSIS — Z8249 Family history of ischemic heart disease and other diseases of the circulatory system: Secondary | ICD-10-CM

## 2023-11-21 DIAGNOSIS — Z95810 Presence of automatic (implantable) cardiac defibrillator: Secondary | ICD-10-CM | POA: Diagnosis not present

## 2023-11-21 DIAGNOSIS — Z79899 Other long term (current) drug therapy: Secondary | ICD-10-CM

## 2023-11-21 DIAGNOSIS — M79602 Pain in left arm: Secondary | ICD-10-CM | POA: Diagnosis not present

## 2023-11-21 DIAGNOSIS — M25512 Pain in left shoulder: Secondary | ICD-10-CM | POA: Diagnosis not present

## 2023-11-21 DIAGNOSIS — K5730 Diverticulosis of large intestine without perforation or abscess without bleeding: Secondary | ICD-10-CM | POA: Diagnosis not present

## 2023-11-21 LAB — COMPREHENSIVE METABOLIC PANEL
ALT: 21 U/L (ref 0–44)
AST: 42 U/L — ABNORMAL HIGH (ref 15–41)
Albumin: 3.9 g/dL (ref 3.5–5.0)
Alkaline Phosphatase: 39 U/L (ref 38–126)
Anion gap: 10 (ref 5–15)
BUN: 17 mg/dL (ref 8–23)
CO2: 21 mmol/L — ABNORMAL LOW (ref 22–32)
Calcium: 9 mg/dL (ref 8.9–10.3)
Chloride: 108 mmol/L (ref 98–111)
Creatinine, Ser: 0.7 mg/dL (ref 0.44–1.00)
GFR, Estimated: >60 mL/min (ref >60)
Glucose, Bld: 181 mg/dL — ABNORMAL HIGH (ref 70–99)
Potassium: 5.2 mmol/L — ABNORMAL HIGH (ref 3.5–5.1)
Sodium: 139 mmol/L (ref 135–145)
Total Bilirubin: 0.8 mg/dL (ref 0.0–1.2)
Total Protein: 6.8 g/dL (ref 6.5–8.1)

## 2023-11-21 LAB — CBC WITH DIFFERENTIAL/PLATELET
Abs Immature Granulocytes: 0.16 10*3/uL — ABNORMAL HIGH (ref 0.00–0.07)
Basophils Absolute: 0.1 10*3/uL (ref 0.0–0.1)
Basophils Relative: 1 %
Eosinophils Absolute: 0.1 10*3/uL (ref 0.0–0.5)
Eosinophils Relative: 1 %
HCT: 44.9 % (ref 36.0–46.0)
Hemoglobin: 14 g/dL (ref 12.0–15.0)
Immature Granulocytes: 1 %
Lymphocytes Relative: 8 %
Lymphs Abs: 1.3 10*3/uL (ref 0.7–4.0)
MCH: 30.2 pg (ref 26.0–34.0)
MCHC: 31.2 g/dL (ref 30.0–36.0)
MCV: 96.8 fL (ref 80.0–100.0)
Monocytes Absolute: 1 10*3/uL (ref 0.1–1.0)
Monocytes Relative: 6 %
Neutro Abs: 14.6 10*3/uL — ABNORMAL HIGH (ref 1.7–7.7)
Neutrophils Relative %: 83 %
Platelets: 250 10*3/uL (ref 150–400)
RBC: 4.64 MIL/uL (ref 3.87–5.11)
RDW: 13.6 % (ref 11.5–15.5)
WBC: 17.2 10*3/uL — ABNORMAL HIGH (ref 4.0–10.5)
nRBC: 0 % (ref 0.0–0.2)

## 2023-11-21 LAB — TROPONIN I (HIGH SENSITIVITY): Troponin I (High Sensitivity): 7 ng/L (ref <18)

## 2023-11-21 MED ORDER — FENTANYL CITRATE PF 50 MCG/ML IJ SOSY
50.0000 ug | PREFILLED_SYRINGE | Freq: Once | INTRAMUSCULAR | Status: AC
  Administered 2023-11-21: 50 ug via INTRAVENOUS
  Filled 2023-11-21: qty 1

## 2023-11-21 MED ORDER — FENTANYL CITRATE PF 50 MCG/ML IJ SOSY
25.0000 ug | PREFILLED_SYRINGE | Freq: Once | INTRAMUSCULAR | Status: AC
  Administered 2023-11-21: 25 ug via INTRAVENOUS
  Filled 2023-11-21: qty 1

## 2023-11-21 MED ORDER — OXYCODONE HCL 5 MG PO TABS
5.0000 mg | ORAL_TABLET | Freq: Once | ORAL | Status: AC
  Administered 2023-11-21: 5 mg via ORAL
  Filled 2023-11-21: qty 1

## 2023-11-21 MED ORDER — IOHEXOL 300 MG/ML  SOLN
100.0000 mL | Freq: Once | INTRAMUSCULAR | Status: AC | PRN
  Administered 2023-11-21: 100 mL via INTRAVENOUS

## 2023-11-21 NOTE — ED Notes (Signed)
 Pt moved to stretcher from recliner using 3 staff and c-spine precautions

## 2023-11-21 NOTE — ED Notes (Signed)
 Pt sats dropped to 86% after fentanyl given, placed on 2LNC and improved to 94%

## 2023-11-21 NOTE — ED Provider Notes (Signed)
 [place] EMERGENCY DEPARTMENT AT [facility] Provider Note   CSN: [id number] Arrival date & time: 11/21/23  1800     History  Chief Complaint  Patient presents with   [name] is a 89 y.o. female.  89 year old female presents today for concern of a fall that occurred while patient was in the bathroom trying to change.  She states she had her walker however the walker toppled over and she fell with it.  She did hit her head but no loss of consciousness.  She struck her left chest wall on the tub.  She lives with her granddaughter who was in the house at the time of the fall.  She complains of left shoulder pain as well.  No other complaints.  The history is provided by the patient. No language interpreter was used.       Home Medications Prior to Admission medications   Medication Sig Start Date End Date Taking? Authorizing Provider  acetaminophen  (TYLENOL ) 500 MG tablet Take 500 mg by mouth every 8 (eight) hours as needed for mild pain or moderate pain.    [provider]  Ascorbic Acid (VITAMIN C) 1000 MG tablet Take 500 mg by mouth daily. 1/2 tablet in the morning and 1/2 tablet in the evening.    [provider]  Calcium  Carb-Cholecalciferol  600-800 MG-UNIT TABS Take 1 tablet by mouth every morning.    [provider]  cholecalciferol  (VITAMIN D3) 25 MCG (1000 UT) tablet Take 1,000 Units by mouth every morning.    [provider]  diclofenac  Sodium (VOLTAREN ) 1 % GEL Apply 2 g topically 4 (four) times daily as needed.    [provider]  furosemide  (LASIX ) 40 MG tablet Take 1 tablet (40 mg total) by mouth as needed. 05/25/23   Ngetich, Dinah C, NP  gabapentin  (NEURONTIN ) 600 MG tablet TAKE 1 TABLET BY MOUTH TWICE A DAY AS NEEDED 10/26/23   Medina-Vargas, Monina C, NP  metoprolol  succinate (TOPROL -XL) 25 MG 24 hr tablet Take 25 mg by mouth daily.     [provider]  Multiple Vitamins-Minerals (ABC  PLUS PO) Take 1 tablet by mouth daily.    [provider]  Omega-3 1000 MG CAPS Take 1 capsule by mouth daily.    [provider]  pantoprazole  (PROTONIX ) 40 MG tablet TAKE 1 TABLET BY MOUTH ONCE DAILY 10/30/23   Ngetich, Dinah C, NP  PARoxetine  (PAXIL ) 20 MG tablet TAKE 1 TABLET BY MOUTH DAILY. 10/04/23   Ngetich, Dinah C, NP  potassium chloride  (MICRO-K ) 10 MEQ CR capsule Take 1 capsule (10 mEq total) by mouth daily as needed. Along with furosemide  05/25/23   Ngetich, Dinah C, NP  PRADAXA  75 MG CAPS capsule Take 75 mg by mouth 2 (two) times daily.  07/21/16   [provider]  pravastatin  (PRAVACHOL ) 10 MG tablet TAKE 1 TABLET BY MOUTH DAILY. 10/04/23   Ngetich, Dinah C, NP  Probiotic Product (ALIGN) 4 MG CAPS Take 1 capsule (4 mg total) by mouth daily. 12/02/19   Ngetich, Dinah C, NP  SYNTHROID  88 MCG tablet TAKE 1 TABLET (88 MCG TOTAL) BY MOUTH DAILY BEFORE BREAKFAST ON EMPTY STOMACH 06/16/23 08/15/23  Ngetich, Roxan BROCKS, NP  UNABLE TO FIND Nutribiotic nasal spray    [provider]      Allergies    Codeine    Review of Systems   Review of Systems  Constitutional:  Negative for chills and fever.  Respiratory:  Negative for shortness of breath.   Cardiovascular:  Positive for chest pain.  Gastrointestinal:  Negative for abdominal pain.  Musculoskeletal:  Positive for arthralgias.  Neurological:  Negative for light-headedness.  All other systems reviewed and are negative.   Physical Exam Updated Vital Signs BP (!) 158/66   Pulse 73   Temp 97.6 F (36.4 C) (Oral)   Resp 18   Ht 5' 4 (1.626 m)   Wt 75.3 kg   SpO2 93%   BMI 28.49 kg/m  Physical Exam Vitals and nursing note reviewed.  Constitutional:      General: She is not in acute distress.    Appearance: Normal appearance. She is not ill-appearing.  HENT:     Head: Normocephalic and atraumatic.     Nose: Nose normal.  Eyes:     General: No scleral icterus.    Extraocular Movements:  Extraocular movements intact.     Conjunctiva/sclera: Conjunctivae normal.  Cardiovascular:     Rate and Rhythm: Normal rate and regular rhythm.  Pulmonary:     Effort: Pulmonary effort is normal. No respiratory distress.     Breath sounds: Normal breath sounds. No wheezing or rales.  Musculoskeletal:        General: Normal range of motion.     Cervical back: Normal range of motion.  Skin:    General: Skin is warm and dry.  Neurological:     General: No focal deficit present.     Mental Status: She is alert. Mental status is at baseline.     ED Results / Procedures / Treatments   Labs (all labs ordered are listed, but only abnormal results are displayed) Labs Reviewed  CBC WITH DIFFERENTIAL/PLATELET - Abnormal; Notable for the following components:      Result Value   WBC 17.2 (*)    Neutro Abs 14.6 (*)    Abs Immature Granulocytes 0.16 (*)    All other components within normal limits  COMPREHENSIVE METABOLIC PANEL - Abnormal; Notable for the following components:   Potassium 5.2 (*)    CO2 21 (*)    Glucose, Bld 181 (*)    AST 42 (*)    All other components within normal limits  URINALYSIS, ROUTINE W REFLEX MICROSCOPIC  TROPONIN I (HIGH SENSITIVITY)  TROPONIN I (HIGH SENSITIVITY)    EKG None  Radiology DG Pelvis Portable Result Date: 11/21/2023 CLINICAL DATA:  fall EXAM: PORTABLE PELVIS 1-2 VIEWS COMPARISON:  CT lumbar spine 04/29/2011 FINDINGS: Right inferior pubic rami irregularity-chronic unstable. No acute displaced fracture or dislocation of either hips on frontal view. There is no evidence of pelvic fracture or diastasis. No pelvic bone lesions are seen. Degenerative changes visualized lower lumbar spine. IMPRESSION: Negative for acute traumatic injury. Electronically Signed   By: Morgane  Naveau M.D.   On: 11/21/2023 21:33   DG Shoulder Left Result Date: 11/21/2023 CLINICAL DATA:  pain sp fall EXAM: LEFT SHOULDER - 2+ VIEW COMPARISON:  Cxr 11/21/23 FINDINGS:  There is no evidence of fracture or dislocation. There is no evidence of arthropathy or other focal bone abnormality. Soft tissues are unremarkable. Left rib fractures better evaluated on chest x-ray. IMPRESSION: No acute displaced fracture or dislocation of the left shoulder. Electronically Signed   By: Morgane  Naveau M.D.   On: 11/21/2023 21:28   CT Head Wo Contrast Addendum Date: 11/21/2023 ADDENDUM REPORT: 11/21/2023 21:25 ADDENDUM: These results were called by telephone at the time of interpretation on 11/21/2023 at 9:24 pm to provider  Dr. Cottie, who verbally acknowledged these results. Electronically Signed   By: Morgane  Naveau M.D.   On: 11/21/2023 21:25   Result Date: 11/21/2023 CLINICAL DATA:  witnessed mechanical fall. Did not hit head. No LOC. Takes blood thinner. C/o left arm pain. Given 100 mcg of fentanyl  with EMS. EXAM: CT HEAD WITHOUT CONTRAST CT CERVICAL SPINE WITHOUT CONTRAST TECHNIQUE: Multidetector CT imaging of the head and cervical spine was performed following the standard protocol without intravenous contrast. Multiplanar CT image reconstructions of the cervical spine were also generated. RADIATION DOSE REDUCTION: This exam was performed according to the departmental dose-optimization program which includes automated exposure control, adjustment of the mA and/or kV according to patient size and/or use of iterative reconstruction technique. COMPARISON:  Ct angion head/neck 12/11/2018 FINDINGS: CT HEAD FINDINGS Brain: Patchy and confluent areas of decreased attenuation are noted throughout the deep and periventricular white matter of the cerebral hemispheres bilaterally, compatible with chronic microvascular ischemic disease. No evidence of large-territorial acute infarction. No parenchymal hemorrhage. No mass lesion. No extra-axial collection. No mass effect or midline shift. No hydrocephalus. Basilar cisterns are patent. Vascular: Atherosclerotic calcifications are present within the  cavernous internal carotid arteries. No hyperdense vessel. Skull: No acute fracture or focal lesion. Sinuses/Orbits: Left sphenoid sinus mucosal thickening. Otherwise paranasal sinuses and mastoid air cells are clear. Bilateral lens replacement. Otherwise the orbits are unremarkable. Other: None. CT CERVICAL SPINE FINDINGS Alignment: Normal. Skull base and vertebrae: No acute fracture. No aggressive appearing focal osseous lesion or focal pathologic process. Soft tissues and spinal canal: No prevertebral fluid or swelling. No visible canal hematoma. Upper chest: Trace left pneumothorax. Other: Atherosclerotic plaque of the aorta and its main branches. IMPRESSION: 1. Trace left pneumothorax. 2. No acute intracranial abnormality. 3. No acute displaced fracture or traumatic listhesis of the cervical spine. 4.  Aortic Atherosclerosis (ICD10-I70.0). Electronically Signed: By: Morgane  Naveau M.D. On: 11/21/2023 21:07   CT Cervical Spine Wo Contrast Addendum Date: 11/21/2023 ADDENDUM REPORT: 11/21/2023 21:25 ADDENDUM: These results were called by telephone at the time of interpretation on 11/21/2023 at 9:24 pm to provider Dr. Cottie, who verbally acknowledged these results. Electronically Signed   By: Morgane  Naveau M.D.   On: 11/21/2023 21:25   Result Date: 11/21/2023 CLINICAL DATA:  witnessed mechanical fall. Did not hit head. No LOC. Takes blood thinner. C/o left arm pain. Given 100 mcg of fentanyl  with EMS. EXAM: CT HEAD WITHOUT CONTRAST CT CERVICAL SPINE WITHOUT CONTRAST TECHNIQUE: Multidetector CT imaging of the head and cervical spine was performed following the standard protocol without intravenous contrast. Multiplanar CT image reconstructions of the cervical spine were also generated. RADIATION DOSE REDUCTION: This exam was performed according to the departmental dose-optimization program which includes automated exposure control, adjustment of the mA and/or kV according to patient size and/or use of  iterative reconstruction technique. COMPARISON:  Ct angion head/neck 12/11/2018 FINDINGS: CT HEAD FINDINGS Brain: Patchy and confluent areas of decreased attenuation are noted throughout the deep and periventricular white matter of the cerebral hemispheres bilaterally, compatible with chronic microvascular ischemic disease. No evidence of large-territorial acute infarction. No parenchymal hemorrhage. No mass lesion. No extra-axial collection. No mass effect or midline shift. No hydrocephalus. Basilar cisterns are patent. Vascular: Atherosclerotic calcifications are present within the cavernous internal carotid arteries. No hyperdense vessel. Skull: No acute fracture or focal lesion. Sinuses/Orbits: Left sphenoid sinus mucosal thickening. Otherwise paranasal sinuses and mastoid air cells are clear. Bilateral lens replacement. Otherwise the orbits are unremarkable. Other: None. CT CERVICAL  SPINE FINDINGS Alignment: Normal. Skull base and vertebrae: No acute fracture. No aggressive appearing focal osseous lesion or focal pathologic process. Soft tissues and spinal canal: No prevertebral fluid or swelling. No visible canal hematoma. Upper chest: Trace left pneumothorax. Other: Atherosclerotic plaque of the aorta and its main branches. IMPRESSION: 1. Trace left pneumothorax. 2. No acute intracranial abnormality. 3. No acute displaced fracture or traumatic listhesis of the cervical spine. 4.  Aortic Atherosclerosis (ICD10-I70.0). Electronically Signed: By: Morgane  Naveau M.D. On: 11/21/2023 21:07   DG Chest 2 View Result Date: 11/21/2023 CLINICAL DATA:  left rib pain. Pt c/o fall with walker and fell onto left side. Complaining of left shoulder pain that radiates to the middle of her back. Unable to remove bra for imaging due to patient pain. EXAM: CHEST - 2 VIEW COMPARISON:  Cxr 07/31/23, chest x-ray 12/11/2018, CT C-spine 11/21/2023 FINDINGS: Two lead left cardiac pacemaker. The heart and mediastinal contours are  unchanged. Atherosclerotic plaque. No focal consolidation. No pulmonary edema. No pleural effusion. No pneumothorax. Acute left 5-9 rib fractures. IMPRESSION: 1. Acute left 5-9 rib fractures. 2. Trace left pneumothorax not well visualized. 3. Aortic Atherosclerosis (ICD10-I70.0). Electronically Signed   By: Morgane  Naveau M.D.   On: 11/21/2023 21:21    Procedures Procedures    Medications Ordered in ED Medications  oxyCODONE  (Oxy IR/ROXICODONE ) immediate release tablet 5 mg (5 mg Oral Given 11/21/23 1902)  fentaNYL  (SUBLIMAZE ) injection 25 mcg (25 mcg Intravenous Given 11/21/23 2007)  fentaNYL  (SUBLIMAZE ) injection 50 mcg (50 mcg Intravenous Given 11/21/23 2201)    ED Course/ Medical Decision Making/ A&P Clinical Course as of 11/21/23 2313  Tue Nov 21, 2023  2248 Discussed with Dr. Cameron of trauma surgery.  She recommends admission at [facility] long to the medicine service and they will consult tomorrow. [AA]    Clinical Course User Index [AA] Hildegard Loge, PA-C                                 Medical Decision Making Amount and/or Complexity of Data Reviewed Radiology: ordered.  Risk Prescription drug management.   Medical Decision Making / ED Course   This patient presents to the ED for concern of fall, this involves an extensive number of treatment options, and is a complaint that carries with it a high risk of complications and morbidity.  The differential diagnosis includes intracranial injury, fracture, internal organ injury  MDM: 89 year old female presents today for concern of fall.  This was an unwitnessed fall.  She complains of pain to the left shoulder, left side of her chest otherwise denies any complaints at this time.  Not on anticoagulation.  CBC shows leukocytosis.  Likely reactive.  CMP shows potassium of 5.2 but no acute hyperkalemic EKG changes.  Glucose 181.  Troponin negative.  CT head, cervical spine showed no acute intracranial or cervical spine pathology  however it does show a trace apical pneumothorax. x-ray of the pelvis, left shoulder without acute concerns.  Chest x-ray shows left 5th-9th rib fractures with associated small apical pneumothorax.  At the end my shift patient is awaiting CT chest abdomen pelvis with contrast.  Once this results patient will need to be admitted to hospitalist service at [facility].  I discussed case with trauma surgeon Dr. Cameron.  They will consult on patient.  Lab Tests: -I ordered, reviewed, and interpreted labs.   The pertinent results include:   Labs Reviewed  CBC WITH DIFFERENTIAL/PLATELET - Abnormal; Notable for the following components:      Result Value   WBC 17.2 (*)    Neutro Abs 14.6 (*)    Abs Immature Granulocytes 0.16 (*)    All other components within normal limits  COMPREHENSIVE METABOLIC PANEL - Abnormal; Notable for the following components:   Potassium 5.2 (*)    CO2 21 (*)    Glucose, Bld 181 (*)    AST 42 (*)    All other components within normal limits  URINALYSIS, ROUTINE W REFLEX MICROSCOPIC  TROPONIN I (HIGH SENSITIVITY)  TROPONIN I (HIGH SENSITIVITY)      EKG  EKG Interpretation Date/Time:    Ventricular Rate:    PR Interval:    QRS Duration:    QT Interval:    QTC Calculation:   R Axis:      Text Interpretation:           Imaging Studies ordered: I ordered imaging studies including CT head, CT cervical spine, CT chest abdomen pelvis with contrast (this did not result at the end of my shift), chest x-ray, pelvic x-ray, left shoulder x-ray I independently visualized and interpreted imaging. I agree with the radiologist interpretation   Medicines ordered and prescription drug management: Meds ordered this encounter  Medications   oxyCODONE  (Oxy IR/ROXICODONE ) immediate release tablet 5 mg    Refill:  0   fentaNYL  (SUBLIMAZE ) injection 25 mcg   fentaNYL  (SUBLIMAZE ) injection 50 mcg    -I have reviewed the patients home medicines and have made  adjustments as needed  Critical interventions Pain control  Consultations Obtained: I requested consultation with the trauma surgery,  and discussed lab and imaging findings as well as pertinent plan - they recommend: As above   Cardiac Monitoring: The patient was maintained on a cardiac monitor.  I personally viewed and interpreted the cardiac monitored which showed an underlying rhythm of: NSR  Reevaluation: After the interventions noted above, I reevaluated the patient and found that they have :improved  Co morbidities that complicate the patient evaluation  Past Medical History:  Diagnosis Date   Acid reflux    Arthritis    Atrial fibrillation (HCC)    Breast cancer (HCC) 2011   Left Breast Cancer   Cancer [facility]) 2011   Left Breast Cancer   Depression    Hyperlipidemia    Hypertension    Insomnia    Osteoporosis    Pacemaker 2010   WIITH DEFIB   Pelvic fracture (HCC) 2010   Thyroid  disease       Dispostion: Signout to oncoming provider to follow-up on CT and discussion with hospitalist for admission.  Patient and granddaughter are aware of plan.  Final Clinical Impression(s) / ED Diagnoses Final diagnoses:  Fall, initial encounter  Closed fracture of multiple ribs of left side, initial encounter    Rx / DC Orders ED Discharge Orders     None         Hildegard Loge, PA-C 11/21/23 2318    Cottie Donnice PARAS, MD 11/21/23 2324

## 2023-11-21 NOTE — ED Notes (Signed)
 Patient transported to X-ray

## 2023-11-21 NOTE — ED Triage Notes (Addendum)
 Patient BIB EMS for witnessed mechanical fall. Did not hit head. No LOC. Takes blood thinner. C/o left arm pain. Given 100 mcg of fentanyl with EMS. O2 dropped after fentanyl was given and is on 2 L on arrival.

## 2023-11-21 NOTE — ED Notes (Signed)
 Patient transported to CT

## 2023-11-22 ENCOUNTER — Inpatient Hospital Stay (HOSPITAL_COMMUNITY): Payer: PPO

## 2023-11-22 ENCOUNTER — Encounter (HOSPITAL_COMMUNITY): Payer: Self-pay | Admitting: Internal Medicine

## 2023-11-22 DIAGNOSIS — I2510 Atherosclerotic heart disease of native coronary artery without angina pectoris: Secondary | ICD-10-CM | POA: Diagnosis not present

## 2023-11-22 DIAGNOSIS — S22009A Unspecified fracture of unspecified thoracic vertebra, initial encounter for closed fracture: Secondary | ICD-10-CM | POA: Diagnosis present

## 2023-11-22 DIAGNOSIS — R6510 Systemic inflammatory response syndrome (SIRS) of non-infectious origin without acute organ dysfunction: Secondary | ICD-10-CM | POA: Diagnosis not present

## 2023-11-22 DIAGNOSIS — M1990 Unspecified osteoarthritis, unspecified site: Secondary | ICD-10-CM | POA: Diagnosis not present

## 2023-11-22 DIAGNOSIS — E875 Hyperkalemia: Secondary | ICD-10-CM | POA: Diagnosis not present

## 2023-11-22 DIAGNOSIS — E039 Hypothyroidism, unspecified: Secondary | ICD-10-CM | POA: Diagnosis not present

## 2023-11-22 DIAGNOSIS — Y92012 Bathroom of single-family (private) house as the place of occurrence of the external cause: Secondary | ICD-10-CM | POA: Diagnosis not present

## 2023-11-22 DIAGNOSIS — I495 Sick sinus syndrome: Secondary | ICD-10-CM | POA: Diagnosis not present

## 2023-11-22 DIAGNOSIS — Z833 Family history of diabetes mellitus: Secondary | ICD-10-CM | POA: Diagnosis not present

## 2023-11-22 DIAGNOSIS — K219 Gastro-esophageal reflux disease without esophagitis: Secondary | ICD-10-CM

## 2023-11-22 DIAGNOSIS — J942 Hemothorax: Secondary | ICD-10-CM | POA: Diagnosis present

## 2023-11-22 DIAGNOSIS — Z823 Family history of stroke: Secondary | ICD-10-CM | POA: Diagnosis not present

## 2023-11-22 DIAGNOSIS — M800AXA Age-related osteoporosis with current pathological fracture, other site, initial encounter for fracture: Secondary | ICD-10-CM | POA: Diagnosis not present

## 2023-11-22 DIAGNOSIS — Z7902 Long term (current) use of antithrombotics/antiplatelets: Secondary | ICD-10-CM | POA: Diagnosis not present

## 2023-11-22 DIAGNOSIS — W19XXXA Unspecified fall, initial encounter: Secondary | ICD-10-CM | POA: Diagnosis not present

## 2023-11-22 DIAGNOSIS — W010XXA Fall on same level from slipping, tripping and stumbling without subsequent striking against object, initial encounter: Secondary | ICD-10-CM | POA: Diagnosis present

## 2023-11-22 DIAGNOSIS — I480 Paroxysmal atrial fibrillation: Secondary | ICD-10-CM | POA: Diagnosis not present

## 2023-11-22 DIAGNOSIS — E785 Hyperlipidemia, unspecified: Secondary | ICD-10-CM | POA: Diagnosis not present

## 2023-11-22 DIAGNOSIS — F39 Unspecified mood [affective] disorder: Secondary | ICD-10-CM | POA: Diagnosis not present

## 2023-11-22 DIAGNOSIS — M8008XA Age-related osteoporosis with current pathological fracture, vertebra(e), initial encounter for fracture: Secondary | ICD-10-CM | POA: Diagnosis not present

## 2023-11-22 DIAGNOSIS — R2689 Other abnormalities of gait and mobility: Secondary | ICD-10-CM | POA: Diagnosis not present

## 2023-11-22 DIAGNOSIS — S2242XA Multiple fractures of ribs, left side, initial encounter for closed fracture: Secondary | ICD-10-CM

## 2023-11-22 DIAGNOSIS — S272XXA Traumatic hemopneumothorax, initial encounter: Secondary | ICD-10-CM | POA: Diagnosis not present

## 2023-11-22 DIAGNOSIS — Z8249 Family history of ischemic heart disease and other diseases of the circulatory system: Secondary | ICD-10-CM | POA: Diagnosis not present

## 2023-11-22 DIAGNOSIS — I10 Essential (primary) hypertension: Secondary | ICD-10-CM | POA: Diagnosis not present

## 2023-11-22 DIAGNOSIS — Z950 Presence of cardiac pacemaker: Secondary | ICD-10-CM | POA: Diagnosis not present

## 2023-11-22 DIAGNOSIS — R1310 Dysphagia, unspecified: Secondary | ICD-10-CM | POA: Diagnosis not present

## 2023-11-22 DIAGNOSIS — F32A Depression, unspecified: Secondary | ICD-10-CM | POA: Diagnosis not present

## 2023-11-22 DIAGNOSIS — K5900 Constipation, unspecified: Secondary | ICD-10-CM | POA: Diagnosis not present

## 2023-11-22 DIAGNOSIS — Z79890 Hormone replacement therapy (postmenopausal): Secondary | ICD-10-CM | POA: Diagnosis not present

## 2023-11-22 DIAGNOSIS — R2681 Unsteadiness on feet: Secondary | ICD-10-CM | POA: Diagnosis not present

## 2023-11-22 DIAGNOSIS — Z853 Personal history of malignant neoplasm of breast: Secondary | ICD-10-CM | POA: Diagnosis not present

## 2023-11-22 DIAGNOSIS — Z66 Do not resuscitate: Secondary | ICD-10-CM | POA: Diagnosis not present

## 2023-11-22 DIAGNOSIS — M6281 Muscle weakness (generalized): Secondary | ICD-10-CM | POA: Diagnosis not present

## 2023-11-22 DIAGNOSIS — S2242XD Multiple fractures of ribs, left side, subsequent encounter for fracture with routine healing: Secondary | ICD-10-CM | POA: Diagnosis not present

## 2023-11-22 DIAGNOSIS — E782 Mixed hyperlipidemia: Secondary | ICD-10-CM

## 2023-11-22 DIAGNOSIS — S2249XA Multiple fractures of ribs, unspecified side, initial encounter for closed fracture: Secondary | ICD-10-CM | POA: Diagnosis present

## 2023-11-22 DIAGNOSIS — Z9181 History of falling: Secondary | ICD-10-CM | POA: Diagnosis not present

## 2023-11-22 DIAGNOSIS — Z7901 Long term (current) use of anticoagulants: Secondary | ICD-10-CM | POA: Diagnosis not present

## 2023-11-22 DIAGNOSIS — I482 Chronic atrial fibrillation, unspecified: Secondary | ICD-10-CM

## 2023-11-22 DIAGNOSIS — S22000A Wedge compression fracture of unspecified thoracic vertebra, initial encounter for closed fracture: Secondary | ICD-10-CM | POA: Diagnosis not present

## 2023-11-22 DIAGNOSIS — S36039A Unspecified laceration of spleen, initial encounter: Secondary | ICD-10-CM | POA: Diagnosis present

## 2023-11-22 LAB — CBC WITH DIFFERENTIAL/PLATELET
Abs Immature Granulocytes: 0.06 10*3/uL (ref 0.00–0.07)
Basophils Absolute: 0 10*3/uL (ref 0.0–0.1)
Basophils Relative: 0 %
Eosinophils Absolute: 0 10*3/uL (ref 0.0–0.5)
Eosinophils Relative: 0 %
HCT: 40.3 % (ref 36.0–46.0)
Hemoglobin: 12.9 g/dL (ref 12.0–15.0)
Immature Granulocytes: 1 %
Lymphocytes Relative: 8 %
Lymphs Abs: 0.8 10*3/uL (ref 0.7–4.0)
MCH: 30.8 pg (ref 26.0–34.0)
MCHC: 32 g/dL (ref 30.0–36.0)
MCV: 96.2 fL (ref 80.0–100.0)
Monocytes Absolute: 0.7 10*3/uL (ref 0.1–1.0)
Monocytes Relative: 6 %
Neutro Abs: 9.1 10*3/uL — ABNORMAL HIGH (ref 1.7–7.7)
Neutrophils Relative %: 85 %
Platelets: 245 10*3/uL (ref 150–400)
RBC: 4.19 MIL/uL (ref 3.87–5.11)
RDW: 13.6 % (ref 11.5–15.5)
WBC: 10.7 10*3/uL — ABNORMAL HIGH (ref 4.0–10.5)
nRBC: 0 % (ref 0.0–0.2)

## 2023-11-22 LAB — COMPREHENSIVE METABOLIC PANEL
ALT: 26 U/L (ref 0–44)
AST: 48 U/L — ABNORMAL HIGH (ref 15–41)
Albumin: 4 g/dL (ref 3.5–5.0)
Alkaline Phosphatase: 40 U/L (ref 38–126)
Anion gap: 11 (ref 5–15)
BUN: 19 mg/dL (ref 8–23)
CO2: 21 mmol/L — ABNORMAL LOW (ref 22–32)
Calcium: 9.5 mg/dL (ref 8.9–10.3)
Chloride: 106 mmol/L (ref 98–111)
Creatinine, Ser: 0.66 mg/dL (ref 0.44–1.00)
GFR, Estimated: >60 mL/min (ref >60)
Glucose, Bld: 213 mg/dL — ABNORMAL HIGH (ref 70–99)
Potassium: 4.5 mmol/L (ref 3.5–5.1)
Sodium: 138 mmol/L (ref 135–145)
Total Bilirubin: 0.9 mg/dL (ref 0.0–1.2)
Total Protein: 6.8 g/dL (ref 6.5–8.1)

## 2023-11-22 LAB — HEMOGLOBIN AND HEMATOCRIT, BLOOD
HCT: 37.7 % (ref 36.0–46.0)
Hemoglobin: 12.3 g/dL (ref 12.0–15.0)

## 2023-11-22 LAB — TYPE AND SCREEN
ABO/RH(D): A NEG
ABO/RH(D): A NEG
Antibody Screen: NEGATIVE
Antibody Screen: NEGATIVE

## 2023-11-22 LAB — MAGNESIUM: Magnesium: 2.3 mg/dL (ref 1.7–2.4)

## 2023-11-22 MED ORDER — PAROXETINE HCL 20 MG PO TABS
20.0000 mg | ORAL_TABLET | Freq: Every day | ORAL | Status: DC
  Administered 2023-11-22 – 2023-11-29 (×8): 20 mg via ORAL
  Filled 2023-11-22 (×8): qty 1

## 2023-11-22 MED ORDER — OXYCODONE-ACETAMINOPHEN 5-325 MG PO TABS: 1.0000 | ORAL_TABLET | ORAL | Status: DC | PRN

## 2023-11-22 MED ORDER — FENTANYL CITRATE PF 50 MCG/ML IJ SOSY
25.0000 ug | PREFILLED_SYRINGE | INTRAMUSCULAR | Status: DC | PRN
  Administered 2023-11-22 (×3): 25 ug via INTRAVENOUS
  Filled 2023-11-22 (×3): qty 1

## 2023-11-22 MED ORDER — ACETAMINOPHEN 500 MG PO TABS
1000.0000 mg | ORAL_TABLET | Freq: Four times a day (QID) | ORAL | Status: DC
  Administered 2023-11-22 – 2023-11-28 (×22): 1000 mg via ORAL
  Filled 2023-11-22 (×25): qty 2

## 2023-11-22 MED ORDER — GABAPENTIN 300 MG PO CAPS
300.0000 mg | ORAL_CAPSULE | Freq: Two times a day (BID) | ORAL | Status: DC
  Administered 2023-11-22 – 2023-11-29 (×15): 300 mg via ORAL
  Filled 2023-11-22 (×15): qty 1

## 2023-11-22 MED ORDER — LEVOTHYROXINE SODIUM 88 MCG PO TABS
88.0000 ug | ORAL_TABLET | Freq: Every day | ORAL | Status: DC
  Administered 2023-11-23 – 2023-11-29 (×7): 88 ug via ORAL
  Filled 2023-11-22 (×7): qty 1

## 2023-11-22 MED ORDER — MELATONIN 3 MG PO TABS: 3.0000 mg | ORAL_TABLET | Freq: Every evening | ORAL | Status: DC | PRN

## 2023-11-22 MED ORDER — METHOCARBAMOL 500 MG PO TABS
500.0000 mg | ORAL_TABLET | Freq: Three times a day (TID) | ORAL | Status: DC
  Administered 2023-11-22 – 2023-11-23 (×4): 500 mg via ORAL
  Filled 2023-11-22 (×4): qty 1

## 2023-11-22 MED ORDER — ACETAMINOPHEN 325 MG PO TABS: 650.0000 mg | ORAL_TABLET | Freq: Four times a day (QID) | ORAL | Status: DC | PRN

## 2023-11-22 MED ORDER — PANTOPRAZOLE SODIUM 40 MG IV SOLR
40.0000 mg | INTRAVENOUS | Status: DC
  Administered 2023-11-22: 40 mg via INTRAVENOUS
  Filled 2023-11-22: qty 10

## 2023-11-22 MED ORDER — ACETAMINOPHEN 650 MG RE SUPP: 650.0000 mg | Freq: Four times a day (QID) | RECTAL | Status: DC | PRN

## 2023-11-22 MED ORDER — METOPROLOL SUCCINATE ER 25 MG PO TB24
25.0000 mg | ORAL_TABLET | Freq: Every day | ORAL | Status: DC
  Administered 2023-11-22 – 2023-11-29 (×8): 25 mg via ORAL
  Filled 2023-11-22 (×8): qty 1

## 2023-11-22 MED ORDER — TRAMADOL HCL 50 MG PO TABS
100.0000 mg | ORAL_TABLET | Freq: Four times a day (QID) | ORAL | Status: DC | PRN
  Administered 2023-11-23: 100 mg via ORAL
  Filled 2023-11-22: qty 2

## 2023-11-22 MED ORDER — PANTOPRAZOLE SODIUM 40 MG PO TBEC
40.0000 mg | DELAYED_RELEASE_TABLET | Freq: Every day | ORAL | Status: DC
  Administered 2023-11-22 – 2023-11-29 (×8): 40 mg via ORAL
  Filled 2023-11-22 (×8): qty 1

## 2023-11-22 MED ORDER — NALOXONE HCL 0.4 MG/ML IJ SOLN: 0.4000 mg | INTRAMUSCULAR | Status: DC | PRN

## 2023-11-22 MED ORDER — ONDANSETRON HCL 4 MG/2ML IJ SOLN: 4.0000 mg | Freq: Four times a day (QID) | INTRAMUSCULAR | Status: DC | PRN

## 2023-11-22 MED ORDER — LIDOCAINE 5 % EX PTCH
1.0000 | MEDICATED_PATCH | CUTANEOUS | Status: AC
Start: 2023-11-22 — End: ?
  Administered 2023-11-22 – 2023-11-29 (×8): 1 via TRANSDERMAL
  Filled 2023-11-22 (×8): qty 1

## 2023-11-22 NOTE — Progress Notes (Signed)
 Progress Note   Patient: [name] ZHY:[id number] DOB: Jan 06, 1926 DOA: 11/21/2023     0 DOS: the patient was seen and examined on 11/22/2023   Brief hospital course: 89yo with h/o afib on Pradaxa , CAD, pacemaker, HTN, and HLD who presented on 1/14 with a fall resulting in multiple left-sided rib fractures, suspected splenic laceration, small left hemopneumothorax, and acute thoracic compression fractures.  Trauma surgery is consulting and she will be admitted to [facility] once a bed is available.  Assessment and Plan:  Fall Mechanical fall resulting in multiple traumatic injuries Trauma service is consulting Admit to [facility] At time of dc, her granddaughter prefers HHPT rather than SNF rehab, if possible  Rib fractures with hemopnuemothorax No chest tube needed at this time Repeat CXR without pneumothorax Repeat Hgb is stable Continue home gabapentin  as needed as well as pain control medications  Splenic laceration Stable Hgb Per trauma, does not need bedrest Hold DOAC and DVT prophylaxis AC  Thoracic compression fractures Read as acute but trauma service thinks maybe chronic Neurosurgery (Dr. Nat Badger) consulted and recommended nonsurgical treatment with conservative measures alone  Afib Hold Pradaxa  Has pacemaker  HTN Continue Toprol  XL  HLD Continue pravastatin   GERD Continue Protonix   Mood d/o Continue paroxetine      Consultants: Trauma surgery  Procedures: None  Antibiotics: None  30 Day Unplanned Readmission Risk Score    Flowsheet Row ED to Hosp-Admission (Current) from 11/21/2023 in [facility] Emergency Department at [facility]  30 Day Unplanned Readmission Risk Score (%) 10.83 Filed at 11/22/2023 0801       This score is the patient's risk of an unplanned readmission within 30 days of being discharged (0 -100%). The score is based on dignosis, age, lab data, medications, orders, and past utilization.   Low:  0-14.9   Medium: 15-21.9    High: 22-29.9   Extreme: 30 and above           Subjective: Reports she was in the bathroom and tripped on a stool, landing on the stool's steps with her left chest.  Significant left-sided rib pain.   Objective: Vitals:   11/22/23 0715 11/22/23 0815  BP: (!) 136/90   Pulse: 74   Resp: 15   Temp:  98.9 F (37.2 C)  SpO2: 97%    No intake or output data in the 24 hours ending 11/22/23 0904 Filed Weights   11/21/23 1809  Weight: 75.3 kg    Exam:  General:  Appears calm and comfortable and is in NAD, conversant Eyes:   EOMI, normal lids, iris ENT:  hard of hearing, grossly normal lips & tongue, mmm Neck:  no LAD, masses or thyromegaly Cardiovascular:  RRR, no m/r/g. No LE edema.  Respiratory:   CTA bilaterally with no wheezes/rales/rhonchi.  Normal respiratory effort.  L chest wall pain. Abdomen:  soft, NT, ND Skin:  scattered early bruising Musculoskeletal:  grossly normal tone BUE/BLE, good ROM, no bony abnormality Psychiatric: blunted mood and affect, speech fluent and appropriate, AOx3, very talkative Neurologic:  CN 2-12 grossly intact, moves all extremities in coordinated fashion  Data Reviewed: I have reviewed the patient's lab results since admission.  Pertinent labs for today include:  Glucose 213 AST 48, 42 on 1/14 WBC 10.7, improved    Family Communication: None present  Disposition: Status is: Inpatient Remains inpatient appropriate because: needs trauma surgery monitoring at [facility]  Planned Discharge Destination: Home with Home Health    Time spent: 50 minutes  Author:  Lorita Rosa, MD 11/22/2023 8:58 AM  For on call review [email or web address].

## 2023-11-22 NOTE — Progress Notes (Signed)
 OT Cancellation Note  Patient Details Name: [name] MRN: [id number] DOB: [birth year]   Cancelled Treatment:    Reason Eval/Treat Not Completed: Medical issues which prohibited therapy Patient is pending trauma consult at this time. OT to continue to follow and check back on 1/16.  Wynette Heckler, MS Acute Rehabilitation Department Office# [phone]  11/22/2023, 8:00 AM

## 2023-11-22 NOTE — Progress Notes (Signed)
  PT Cancellation Note  Patient Details Name: [name] MRN: [id number] DOB: [birth year]   Cancelled Treatment:    Reason Eval/Treat Not Completed: Patient not medically ready, trauma consult, to be admitted. Abelina Hoes PT Acute Rehabilitation Services Office [phone] Weekend [phone]    Dareen Ebbing 11/22/2023, 7:56 AM

## 2023-11-22 NOTE — ED Provider Notes (Signed)
 Care assumed from previous provider.  See note for full HPI.  In summation 89 year old on Pradaxa  here for evaluation mechanical fall.  Chest x-ray showed multiple rib fractures.  Labs show leukocytosis, potassium of 5.2.  Previous provider spoke with Dr. Jamse Mcgee with trauma surgery who recommended medicine admission can stay at [facility] long and can see in consult.  Plan to follow-up on CT chest abdomen pelvis and admission Physical Exam  BP (!) 163/76   Pulse 78   Temp 97.6 F (36.4 C) (Oral)   Resp 18   Ht 5\' 4"  (1.626 m)   Wt 75.3 kg   SpO2 95%   BMI 28.49 kg/m   Physical Exam Vitals and nursing note reviewed.  Constitutional:      General: She is not in acute distress.    Appearance: She is well-developed. She is not ill-appearing, toxic-appearing or diaphoretic.  HENT:     Head: Normocephalic and atraumatic.     Nose: Nose normal.     Mouth/Throat:     Mouth: Mucous membranes are moist.  Eyes:     Pupils: Pupils are equal, round, and reactive to light.  Cardiovascular:     Rate and Rhythm: Normal rate.     Pulses: Normal pulses.     Heart sounds: Normal heart sounds.  Pulmonary:     Effort: Pulmonary effort is normal. No respiratory distress.     Breath sounds: Normal breath sounds.  Abdominal:     General: Bowel sounds are normal. There is no distension.     Palpations: Abdomen is soft.     Tenderness: There is no abdominal tenderness. There is no right CVA tenderness, left CVA tenderness or guarding.  Musculoskeletal:        General: Normal range of motion.     Cervical back: Normal range of motion.  Skin:    General: Skin is warm and dry.     Capillary Refill: Capillary refill takes less than 2 seconds.  Neurological:     General: No focal deficit present.     Mental Status: She is alert.  Psychiatric:        Mood and Affect: Mood normal.     Procedures  .Critical Care  Performed by: Dickson Founds, PA-C Authorized by: Dickson Founds, PA-C    Critical care provider statement:    Critical care time (minutes):  35   Critical care was necessary to treat or prevent imminent or life-threatening deterioration of the following conditions:  Trauma   Critical care was time spent personally by me on the following activities:  Development of treatment plan with patient or surrogate, discussions with consultants, evaluation of patient's response to treatment, examination of patient, ordering and review of laboratory studies, ordering and review of radiographic studies, ordering and performing treatments and interventions, pulse oximetry, re-evaluation of patient's condition and review of old charts   ED Course / MDM   Clinical Course as of 11/22/23 0302  Tue Nov 21, 2023  2248 Discussed with Dr. Jamse Mcgee of trauma surgery.  She recommends admission at [facility] long to the medicine service and they will consult tomorrow. [AA]  Wed Nov 22, 2023  0106 Discussed with Dr. Jamse Mcgee on with trauma surgery.  We discussed her new findings.  She still recommends medicine admission however over at [facility] so trauma and neurosurgery can see as well. [BH]  0141 Discussed with Dr. Nat Badger with [facility], nothing to do for thoracic compression fractures [BH]    Clinical  Course User Index [AA] Lucina Sabal, PA-C [BH] Jonovan Boedecker A, PA-C   Care assumed from previous provider.  See note for full HPI.  In summation 89 year old on Pradaxa  here for evaluation mechanical fall.  Chest x-ray showed multiple rib fractures.  Labs show leukocytosis, potassium of 5.2.  Previous provider spoke with Dr. Jamse Mcgee with trauma surgery who recommended medicine admission can stay at [facility] long and can see in consult.  Plan to follow-up on CT chest abdomen pelvis and admission  Labs and imaging personally viewed interpreted CT chest abdomen pelvis shows rib fracture 5 through 9, hemopneumothorax, splenic laceration, multiple compression fractures of the thoracic  region  Discussed with Dr. Lanell Pinta again.  Continues to recommend medicine admission however bed over at [facility] for trauma surgery neurosurgery to see  Discussed with Dr. Nat Badger with neurosurgery  Discussed with Dr. Brock Canner with medicine who is agreeable to evaluate patient for admission  Discussed plan and admission with transfer to [facility] with patient and granddaughter in room.  Family and patient agreeable.  The patient appears reasonably stabilized for admission considering the current resources, flow, and capabilities available in the ED at this time, and I doubt any other [facility] requiring further screening and/or treatment in the ED prior to admission.    Medical Decision Making Amount and/or Complexity of Data Reviewed Independent Historian:     Details: Granddaughter in room External Data Reviewed: labs, radiology, ECG and notes. Labs: ordered. Decision-making details documented in ED Course. Radiology: ordered and independent interpretation performed. Decision-making details documented in ED Course. ECG/medicine tests: ordered and independent interpretation performed. Decision-making details documented in ED Course.  Risk Prescription drug management. Decision regarding hospitalization.     Fall Multiple rib fractures Hemopneumothorax Laceration of spleen Compression fracture multiple thoracic vertebrae Chronic anticoagulation hyperkalemia       [name] A, PA-C 11/22/23 0302    Alicia Patricia, MD 11/22/23 [phone]

## 2023-11-22 NOTE — H&P (Signed)
 History and Physical      [name] ZOX:[id number] DOB: 10-16-1926 DOA: 11/21/2023; DOS: 11/22/2023  PCP: Alicia Heman, NP  Patient coming from: home   I have personally briefly reviewed patient's old medical records in [facility]  Chief Complaint: fall  HPI: [name] is a 89 y.o. female with medical history significant for chronic atrial fibrillation, CAD by sick sinus syndrome status post pacemaker placement chronically anticoagulated on Pradaxa , essential pretension, hyperlipidemia, who is admitted to [facility] on 11/21/2023 with multiple consecutive left-sided rib fractures after presenting from home to [facility] ED complaining of fall.   The following history is obtained via my discussions with the patient as well as her granddaughter, who is present at bedside, in addition to my discussions with the EDP and via chart review.  The patient, who lives at home with her granddaughter, experienced a ground-level fall earlier today in which she tripped while ambulating at home resulting in a fall to the floor in which she struck the left portion of her body, without hitting her head and without any associated loss of consciousness.  As a result of this fall, she notes new onset left lateral posterior chest wall discomfort, as well as some new mild midline mid back discomfort, without any associated acute focal weakness nor any acute focal numbness/paresthesias.  Denies any resultant headache, or new neck pain.  She also denies any associated significant acute abdominal discomfort, nor any acute hip discomfort.  She does report some mild acute left shoulder discomfort.  No acute change in vision, including no blurry vision or diplopia.  In the setting of a history of chronic atrial fibrillation, she is chronically anticoagulated on Pradaxa , with most recent dose occurring on the morning of 11/21/2023.  Otherwise, she is now on any additional blood thinners at home.  Denies  any recent shortness of breath, cough, dizziness, presyncope, syncope.  She also denies any recent subjective fever, chills, rigors, or generalized myalgias.  No recent dysuria or gross hematuria.     [facility] ED Course:  Vital signs in the ED were notable for the following: Afebrile; heart rates in the 70s to 80s; systolic pressures in the 130s to 160s; respiratory rate 17-24, oxygen saturation 95 to 97% on room air.  Labs were notable for the following: CMP was notable for the following: Sodium 139, creatinine 0.70, AST 42.  Otherwise, liver enzymes are within normal limits.  High sensitive troponin I 7.  CBC notable for white blood cell count 17,200, hemoglobin 14, platelet count 250.  Urinalysis has been ordered, with result currently pending.  Per my interpretation, EKG in ED demonstrated the following: Atrial fibrillation with nonspecific intraventricular conduction delay, heart rate 71, nonspecific T wave version in leads I, aVL, no evidence of ST changes, including no evidence of ST elevation.  Imaging in the ED, per corresponding formal radiology read, was notable for the following: Plain films of shoulder showed no evidence of acute fracture or dislocation.  Plain films of the pelvis showed no evidence of acute traumatic injury.  Noncontrast CT head showed no evidence of acute intracranial process, Cleen evidence of intracranial strain evidence of acute infarct.  CT cervical spine showed no evidence of acute cervical spine fracture or subluxation injury.  CT chest, abdomen, pelvis with contrast showed acute displaced left posterior 5th through 9th rib fractures in addition to a small volume of left hemopneumothorax as well as suspected grade 1 splenic laceration without evidence of  acute extravasation of contrast.  This imaging also showed likely acute T2, T4, T5 superior endplate compression fractures with at least 50% vertebral body height loss associated with the T4 compression  fracture.  Regarding the thoracic compression fractures, EDP at [facility] discussed with on-call neurosurgery, Dr. Sissy Duff, who conveyed that these injuries are nonsurgical in nature, and recommended conservative measures alone. Additionally, [facility] EDP d/w on-call trauma surgery, Dr. Jamse Mcgee, who recommended TRH admit to [facility] where trauma surgery will formally consult. Dr. Jamse Mcgee did not recommend reversal of pt's Pradaxa  and did not feel that a left-sided chest tube is indicated at this time.   While in the ED, the following were administered: Fentanyl  25 mcg IV x 1 dose, fentanyl  50 mcg IV x 1 dose, oxycodone  5 mg p.o. x 1 dose.  Subsequently, the patient was admitted to [facility] for further evaluation management of presenting acute multiple consecutive left-sided rib fractures, suspected splenic laceration, small left hemopneumothorax, all stemming from ground-level mechanical fall at home.    Review of Systems: As per HPI otherwise 10 point review of systems negative.   Past Medical History:  Diagnosis Date   Acid reflux    Arthritis    Atrial fibrillation (HCC)    Breast cancer (HCC) 2011   Left Breast Cancer   Cancer [facility]) 2011   Left Breast Cancer   Depression    Hyperlipidemia    Hypertension    Insomnia    Osteoporosis    Pacemaker 2010   WIITH DEFIB   Pelvic fracture (HCC) 2010   Thyroid  disease     Past Surgical History:  Procedure Laterality Date   ABDOMINAL HYSTERECTOMY  35 yrs ago   BREAST LUMPECTOMY Left 05/23/10   CARDIAC DEFIBRILLATOR PLACEMENT  2 years ago   CHOLECYSTECTOMY  50 YEARS AGO   ELBOW SURGERY  2006    Social History:  reports that she has never smoked. She has never used smokeless tobacco. She reports that she does not drink alcohol and does not use drugs.   Allergies  Allergen Reactions   Codeine Nausea Only    Family History  Problem Relation Age of Onset   Heart disease Mother    Diabetes Father    Stroke Father    Cancer Sister         lung   Cancer Brother        liver   COPD Brother    Heart attack Brother    Stroke Brother    Heart attack Brother     Family history reviewed and not pertinent    Prior to Admission medications   Medication Sig Start Date End Date Taking? Authorizing Provider  acetaminophen  (TYLENOL ) 500 MG tablet Take 500 mg by mouth every 8 (eight) hours as needed for mild pain or moderate pain.    [provider]  Ascorbic Acid (VITAMIN C) 1000 MG tablet Take 500 mg by mouth daily. 1/2 tablet in the morning and 1/2 tablet in the evening.    [provider]  Calcium  Carb-Cholecalciferol  600-800 MG-UNIT TABS Take 1 tablet by mouth every morning.    [provider]  cholecalciferol  (VITAMIN D3) 25 MCG (1000 UT) tablet Take 1,000 Units by mouth every morning.    [provider]  diclofenac  Sodium (VOLTAREN ) 1 % GEL Apply 2 g topically 4 (four) times daily as needed.    [provider]  furosemide  (LASIX ) 40 MG tablet Take 1 tablet (40 mg total) by mouth as  needed. 05/25/23   Ngetich, Dinah C, NP  gabapentin  (NEURONTIN ) 600 MG tablet TAKE 1 TABLET BY MOUTH TWICE A DAY AS NEEDED 10/26/23   Medina-Vargas, Monina C, NP  metoprolol  succinate (TOPROL -XL) 25 MG 24 hr tablet Take 25 mg by mouth daily.     [provider]  Multiple Vitamins-Minerals (ABC PLUS PO) Take 1 tablet by mouth daily.    [provider]  Omega-3 1000 MG CAPS Take 1 capsule by mouth daily.    [provider]  pantoprazole  (PROTONIX ) 40 MG tablet TAKE 1 TABLET BY MOUTH ONCE DAILY 10/30/23   Ngetich, Dinah C, NP  PARoxetine  (PAXIL ) 20 MG tablet TAKE 1 TABLET BY MOUTH DAILY. 10/04/23   Ngetich, Dinah C, NP  potassium chloride  (MICRO-K ) 10 MEQ CR capsule Take 1 capsule (10 mEq total) by mouth daily as needed. Along with furosemide  05/25/23   Ngetich, Dinah C, NP  PRADAXA  75 MG CAPS capsule Take 75 mg by mouth 2 (two) times daily.  07/21/16   [provider]   pravastatin  (PRAVACHOL ) 10 MG tablet TAKE 1 TABLET BY MOUTH DAILY. 10/04/23   Ngetich, Dinah C, NP  Probiotic Product (ALIGN) 4 MG CAPS Take 1 capsule (4 mg total) by mouth daily. 12/02/19   Ngetich, Dinah C, NP  SYNTHROID  88 MCG tablet TAKE 1 TABLET (88 MCG TOTAL) BY MOUTH DAILY BEFORE BREAKFAST ON EMPTY STOMACH 06/16/23 08/15/23  Ngetich, Elijio Guadeloupe, NP  UNABLE TO FIND Nutribiotic nasal spray    [provider]     Objective    Physical Exam: Vitals:   11/22/23 0100 11/22/23 0115 11/22/23 0130 11/22/23 0145  BP: (!) 172/77 (!) 153/92 (!) 160/78 (!) 163/76  Pulse: 73 (!) 264 70 78  Resp: (!) 23 18 (!) 24 18  Temp:      TempSrc:      SpO2: 95%  95% 95%  Weight:      Height:        General: appears to be stated age; alert, oriented Skin: warm, dry, no rash Head:  AT/[place] Mouth:  Oral mucosa membranes appear moist, normal dentition Neck: supple; trachea midline Heart: Irregular, rate controlled; did not appreciate any M/R/G Lungs: CTAB, did not appreciate any wheezes, rales, or rhonchi Abdomen: + BS; soft, ND, NT Vascular: 2+ pedal pulses b/l; 2+ radial pulses b/l Extremities: no peripheral edema, no muscle wasting Neuro: strength and sensation intact in upper and lower extremities b/l     Labs on Admission: I have personally reviewed following labs and imaging studies  CBC: Recent Labs  Lab 11/21/23 1923  WBC 17.2*  NEUTROABS 14.6*  HGB 14.0  HCT 44.9  MCV 96.8  PLT 250   Basic Metabolic Panel: Recent Labs  Lab 11/21/23 1923  NA 139  K 5.2*  CL 108  CO2 21*  GLUCOSE 181*  BUN 17  CREATININE 0.70  CALCIUM  9.0   GFR: Estimated Creatinine Clearance: 41.8 mL/min (by C-G formula based on SCr of 0.7 mg/dL). Liver Function Tests: Recent Labs  Lab 11/21/23 1923  AST 42*  ALT 21  ALKPHOS 39  BILITOT 0.8  PROT 6.8  ALBUMIN 3.9   No results for input(s): "LIPASE", "AMYLASE" in the last 168 hours. No results for input(s): "AMMONIA" in the last 168  hours. Coagulation Profile: No results for input(s): "INR", "PROTIME" in the last 168 hours. Cardiac Enzymes: No results for input(s): "CKTOTAL", "CKMB", "CKMBINDEX", "TROPONINI" in the last 168 hours. BNP (last 3 results) No results for  input(s): "PROBNP" in the last 8760 hours. HbA1C: No results for input(s): "HGBA1C" in the last 72 hours. CBG: No results for input(s): "GLUCAP" in the last 168 hours. Lipid Profile: No results for input(s): "CHOL", "HDL", "LDLCALC", "TRIG", "CHOLHDL", "LDLDIRECT" in the last 72 hours. Thyroid  Function Tests: No results for input(s): "TSH", "T4TOTAL", "FREET4", "T3FREE", "THYROIDAB" in the last 72 hours. Anemia Panel: No results for input(s): "VITAMINB12", "FOLATE", "FERRITIN", "TIBC", "IRON", "RETICCTPCT" in the last 72 hours. Urine analysis:    Component Value Date/Time   COLORURINE DARK YELLOW 11/06/2019 1551   APPEARANCEUR CLOUDY (A) 11/06/2019 1551   LABSPEC 1.017 11/06/2019 1551   PHURINE 8.0 11/06/2019 1551   GLUCOSEU NEGATIVE 11/06/2019 1551   HGBUR NEGATIVE 11/06/2019 1551   BILIRUBINUR negative 06/02/2023 1424   KETONESUR TRACE (A) 11/06/2019 1551   PROTEINUR Negative 06/02/2023 1424   PROTEINUR TRACE (A) 11/06/2019 1551   UROBILINOGEN 0.2 06/02/2023 1424   NITRITE negative 06/02/2023 1424   NITRITE NEGATIVE 11/06/2019 1551   LEUKOCYTESUR Moderate (2+) (A) 06/02/2023 1424   LEUKOCYTESUR 2+ (A) 11/06/2019 1551    Radiological Exams on Admission: CT CHEST ABDOMEN PELVIS W CONTRAST Result Date: 11/22/2023 CLINICAL DATA:  Polytrauma, blunt Fall, wc's 17.2, GFR>60, hx of lt breat ca 2001 EXAM: CT CHEST, ABDOMEN, AND PELVIS WITH CONTRAST TECHNIQUE: Multidetector CT imaging of the chest, abdomen and pelvis was performed following the standard protocol during bolus administration of intravenous contrast. RADIATION DOSE REDUCTION: This exam was performed according to the departmental dose-optimization program which includes automated exposure  control, adjustment of the mA and/or kV according to patient size and/or use of iterative reconstruction technique. CONTRAST:  OMNIPAQUE  IOHEXOL  300 MG/ML  SOLN COMPARISON:  None Available. FINDINGS: CHEST: Cardiovascular: No aortic injury. The thoracic aorta is normal in caliber. Bilateral enlarged atria. No significant pericardial effusion. Left cardiac pacemaker defibrillator. Severe atherosclerotic plaque. At least 3 vessel coronary calcification. Mediastinum/Nodes: No pneumomediastinum. No mediastinal hematoma. The esophagus is unremarkable. The thyroid  is unremarkable. The central airways are patent. No mediastinal, hilar, or axillary lymphadenopathy. Lungs/Pleura: Diffuse bronchial wall thickening. Bilateral lower lobe atelectasis. No focal consolidation. Subpleural micronodule (2:23). No pulmonary mass. No pulmonary contusion or laceration. No pneumatocele formation. Small volume left hemopneumothorax. No right hemothorax. No right pneumothorax. Musculoskeletal/Chest wall: No chest wall mass. Acute displaced left posterior 5 - 9 rib fractures. Old healed left posterior eleventh rib fracture. No sternal fracture. Age-indeterminate T2, T4, T5 superior endplate compression fracture. Up to at least 50% vertebral body height loss at the T4 level. ABDOMEN / PELVIS: Hepatobiliary: Not enlarged. No focal lesion. No laceration or subcapsular hematoma. The gallbladder is otherwise unremarkable with no radio-opaque gallstones. No biliary ductal dilatation. Pancreas: Normal pancreatic contour. No main pancreatic duct dilatation. Spleen: Not enlarged. Slight irregularity of the superior lateral splenic parenchyma with trace perisplenic high density free fluid (8:46, 10:172). No  subcapsular hematoma or vascular injury. Adrenals/Urinary Tract: No nodularity bilaterally. Bilateral kidneys enhance symmetrically. No hydronephrosis. No contusion, laceration, or subcapsular hematoma. No injury to the vascular structures  or collecting systems. No hydroureter. The urinary bladder is unremarkable. Stomach/Bowel: No small or large bowel wall thickening or dilatation. Diffuse colonic diverticulosis. The appendix is not definitely identified with no inflammatory changes in the right lower quadrant to suggest acute appendicitis. Vasculature/Lymphatics: No abdominal aorta or iliac aneurysm. No active contrast extravasation or pseudoaneurysm. No abdominal, pelvic, inguinal lymphadenopathy. Reproductive: Normal. Other: No simple free fluid ascites. No pneumoperitoneum. No hemoperitoneum. No mesenteric hematoma identified. No organized fluid  collection. Musculoskeletal: No significant soft tissue hematoma. No acute pelvic fracture. No spinal fracture. Old healed right pelvic fracture.Multilevel degenerative changes spine. Dextroscoliosis of the lumbar spine. Ports and Devices: None. IMPRESSION: 1. Small volume left hemopneumothorax. 2. Likely AAST grade 1 splenic laceration. 3. Acute displaced left posterior 5 - 9 rib fractures. 4. Age-indeterminate, likely acute, T2, T4, T5 superior endplate compression fracture. Up to at least 50% vertebral body height loss at the T4 level. Correlate with point tenderness to palpation to evaluate for acute component. 5. Other imaging findings of potential clinical significance: Cardiomegaly. Colonic diverticulosis with no acute diverticulitis. Aortic Atherosclerosis (ICD10-I70.0)-with at least 3 vessel coronary calcification. Electronically Signed   By: Morgane  Naveau M.D.   On: 11/22/2023 00:17   DG Pelvis Portable Result Date: 11/21/2023 CLINICAL DATA:  fall EXAM: PORTABLE PELVIS 1-2 VIEWS COMPARISON:  CT lumbar spine 04/29/2011 FINDINGS: Right inferior pubic rami irregularity-chronic unstable. No acute displaced fracture or dislocation of either hips on frontal view. There is no evidence of pelvic fracture or diastasis. No pelvic bone lesions are seen. Degenerative changes visualized lower lumbar  spine. IMPRESSION: Negative for acute traumatic injury. Electronically Signed   By: Morgane  Naveau M.D.   On: 11/21/2023 21:33   DG Shoulder Left Result Date: 11/21/2023 CLINICAL DATA:  pain sp fall EXAM: LEFT SHOULDER - 2+ VIEW COMPARISON:  Cxr 11/21/23 FINDINGS: There is no evidence of fracture or dislocation. There is no evidence of arthropathy or other focal bone abnormality. Soft tissues are unremarkable. Left rib fractures better evaluated on chest x-ray. IMPRESSION: No acute displaced fracture or dislocation of the left shoulder. Electronically Signed   By: Morgane  Naveau M.D.   On: 11/21/2023 21:28   CT Head Wo Contrast Addendum Date: 11/21/2023 ADDENDUM REPORT: 11/21/2023 21:25 ADDENDUM: These results were called by telephone at the time of interpretation on 11/21/2023 at 9:24 pm to provider Dr. Gordon Latus, who verbally acknowledged these results. Electronically Signed   By: Morgane  Naveau M.D.   On: 11/21/2023 21:25   Result Date: 11/21/2023 CLINICAL DATA:  witnessed mechanical fall. Did not hit head. No LOC. Takes blood thinner. C/o left arm pain. Given 100 mcg of fentanyl  with EMS. EXAM: CT HEAD WITHOUT CONTRAST CT CERVICAL SPINE WITHOUT CONTRAST TECHNIQUE: Multidetector CT imaging of the head and cervical spine was performed following the standard protocol without intravenous contrast. Multiplanar CT image reconstructions of the cervical spine were also generated. RADIATION DOSE REDUCTION: This exam was performed according to the departmental dose-optimization program which includes automated exposure control, adjustment of the mA and/or kV according to patient size and/or use of iterative reconstruction technique. COMPARISON:  Ct angion head/neck 12/11/2018 FINDINGS: CT HEAD FINDINGS Brain: Patchy and confluent areas of decreased attenuation are noted throughout the deep and periventricular white matter of the cerebral hemispheres bilaterally, compatible with chronic microvascular ischemic disease.  No evidence of large-territorial acute infarction. No parenchymal hemorrhage. No mass lesion. No extra-axial collection. No mass effect or midline shift. No hydrocephalus. Basilar cisterns are patent. Vascular: Atherosclerotic calcifications are present within the cavernous internal carotid arteries. No hyperdense vessel. Skull: No acute fracture or focal lesion. Sinuses/Orbits: Left sphenoid sinus mucosal thickening. Otherwise paranasal sinuses and mastoid air cells are clear. Bilateral lens replacement. Otherwise the orbits are unremarkable. Other: None. CT CERVICAL SPINE FINDINGS Alignment: Normal. Skull base and vertebrae: No acute fracture. No aggressive appearing focal osseous lesion or focal pathologic process. Soft tissues and spinal canal: No prevertebral fluid or swelling. No visible canal hematoma. Upper  chest: Trace left pneumothorax. Other: Atherosclerotic plaque of the aorta and its main branches. IMPRESSION: 1. Trace left pneumothorax. 2. No acute intracranial abnormality. 3. No acute displaced fracture or traumatic listhesis of the cervical spine. 4.  Aortic Atherosclerosis (ICD10-I70.0). Electronically Signed: By: Morgane  Naveau M.D. On: 11/21/2023 21:07   CT Cervical Spine Wo Contrast Addendum Date: 11/21/2023 ADDENDUM REPORT: 11/21/2023 21:25 ADDENDUM: These results were called by telephone at the time of interpretation on 11/21/2023 at 9:24 pm to provider Dr. Gordon Latus, who verbally acknowledged these results. Electronically Signed   By: Morgane  Naveau M.D.   On: 11/21/2023 21:25   Result Date: 11/21/2023 CLINICAL DATA:  witnessed mechanical fall. Did not hit head. No LOC. Takes blood thinner. C/o left arm pain. Given 100 mcg of fentanyl  with EMS. EXAM: CT HEAD WITHOUT CONTRAST CT CERVICAL SPINE WITHOUT CONTRAST TECHNIQUE: Multidetector CT imaging of the head and cervical spine was performed following the standard protocol without intravenous contrast. Multiplanar CT image reconstructions of  the cervical spine were also generated. RADIATION DOSE REDUCTION: This exam was performed according to the departmental dose-optimization program which includes automated exposure control, adjustment of the mA and/or kV according to patient size and/or use of iterative reconstruction technique. COMPARISON:  Ct angion head/neck 12/11/2018 FINDINGS: CT HEAD FINDINGS Brain: Patchy and confluent areas of decreased attenuation are noted throughout the deep and periventricular white matter of the cerebral hemispheres bilaterally, compatible with chronic microvascular ischemic disease. No evidence of large-territorial acute infarction. No parenchymal hemorrhage. No mass lesion. No extra-axial collection. No mass effect or midline shift. No hydrocephalus. Basilar cisterns are patent. Vascular: Atherosclerotic calcifications are present within the cavernous internal carotid arteries. No hyperdense vessel. Skull: No acute fracture or focal lesion. Sinuses/Orbits: Left sphenoid sinus mucosal thickening. Otherwise paranasal sinuses and mastoid air cells are clear. Bilateral lens replacement. Otherwise the orbits are unremarkable. Other: None. CT CERVICAL SPINE FINDINGS Alignment: Normal. Skull base and vertebrae: No acute fracture. No aggressive appearing focal osseous lesion or focal pathologic process. Soft tissues and spinal canal: No prevertebral fluid or swelling. No visible canal hematoma. Upper chest: Trace left pneumothorax. Other: Atherosclerotic plaque of the aorta and its main branches. IMPRESSION: 1. Trace left pneumothorax. 2. No acute intracranial abnormality. 3. No acute displaced fracture or traumatic listhesis of the cervical spine. 4.  Aortic Atherosclerosis (ICD10-I70.0). Electronically Signed: By: Morgane  Naveau M.D. On: 11/21/2023 21:07   DG Chest 2 View Result Date: 11/21/2023 CLINICAL DATA:  left rib pain. Pt c/o fall with walker and fell onto left side. Complaining of left shoulder pain that radiates  to the middle of her back. Unable to remove bra for imaging due to patient pain. EXAM: CHEST - 2 VIEW COMPARISON:  Cxr 07/31/23, chest x-ray 12/11/2018, CT C-spine 11/21/2023 FINDINGS: Two lead left cardiac pacemaker. The heart and mediastinal contours are unchanged. Atherosclerotic plaque. No focal consolidation. No pulmonary edema. No pleural effusion. No pneumothorax. Acute left 5-9 rib fractures. IMPRESSION: 1. Acute left 5-9 rib fractures. 2. Trace left pneumothorax not well visualized. 3. Aortic Atherosclerosis (ICD10-I70.0). Electronically Signed   By: Morgane  Naveau M.D.   On: 11/21/2023 21:21      Assessment/Plan    Principal Problem:   Multiple rib fractures Active Problems:   HLD (hyperlipidemia)   Acquired hypothyroidism   GERD (gastroesophageal reflux disease)   Chronic atrial fibrillation with RVR (HCC)   Depression   Fall at home, initial encounter   Splenic laceration   Mult fractures of thoracic spine, closed (  HCC)   SIRS (systemic inflammatory response syndrome) (HCC)   Hemopneumothorax on left     #) Multiple consecutive left-sided-sided rib fractures: Following ground-level mechanical fall experienced earlier today without loss of consciousness, presenting imaging shows evidence of acute displaced left posterior 5th through 9th rib fractures.  This is in addition to a small left hemopneumothorax.   given the involvement of multiple consecutive consecutive ribs, will strive to ensure establishment of adequate pain control so as to promote sufficient inspiratory effort in order to decrease subsequent risk for development of pneumonia should insufficient pain control not be achieved.  At this time, the patient does not appear to be in any respiratory distress, and is maintaining oxygen saturations in the mid to high 90s on room air. Will order prn Percocet for the anti-inflammatory benefits a/w inclusion of acetaminophen .    Plan: prn Percocet, as further described  above, for moderate pain.  Prn IV fentanyl  for severe pain.  Incentive spirometry to evaluate for and promote adequate inspiratory effort in the setting of presenting multiple consecutive rib fractures, in part as a means of assessing adequacy of current level of pain control.  Check UA as part of evaluation for any underlying reversible factors contributing to precipitating ground-level mechanical fall.  Trauma surgery to formally consult at [facility], as further detailed above.                         #) Splenic laceration: In the setting of 2 days, mechanical fall, CT abdomen/pelvis shows a suspected grade 1 splenic laceration without evidence of active contrast extravasation.  Patient denies any significant abdominal discomfort, and physical exam reveals no evidence of acute peritoneal signs.  She appears hemodynamically stable, and hemoglobin is at baseline.  It is notable that she is chronically anticoagulated on Pradaxa  with most recent dose occurring on the morning of 11/21/2023.  EDP discussed patient's case with on-call trauma surgery, Dr. Lanell Pinta, who will formally consult at [facility], and does not feel that urgent surgical intervention is indicated at this time and did not request reversal of the patient's Pradaxa .  Plan: Trauma surgery to consult at [facility], as above.  N.p.o. for now.  Hold home Pradaxa .  Type and screen ordered.  Every 4 hour H&H's through 1300 on 11/22/2023.  CBC in the morning.  Add on PTT, INR.  Prn IV fentanyl .  Assess from a tree ordered.  Monitor on telemetry.               #) Multiple acute thoracic compression fractures: in the setting of her ground-level mechanical fall this evening, with new mild midline mid back discomfort, CT abdomen/pelvis shows evidence of acute T2, T4, T5 superior endplate compression fractures with at least 50% vertebral body height loss associate with a T4 compression fracture.  No evidence of red flag  symptoms at this time.  EDP at [facility] discussed with on-call neurosurgery, Dr. Sissy Duff, who conveyed that these injuries are nonsurgical in nature, and recommended conservative measures alone.  Did not specifically recommend TLSO brace.  Over send conservative measures, including pain control, PT/OT consults, as further detailed below.  Of note, patient's granddaughter, with whom the patient lives, would prefer home health physical therapy as opposed to the patient going to SNF temporary rehab, if possible.   Plan: Prn IV fentanyl , as above.  Fall precautions ordered.  PT/OT consults in the morning.  Incentive spirometry.               #)  Acute left hemopneumothorax: In the setting of the patient's relative mechanical fall today, CT chest shows a small volume left hemopneumothorax.  No evidence of acute respiratory distress at this time.  No clinical or radiographic evidence to suggest a component of tension pneumothorax.  EDP discussed patient's case with on-call trauma surgery, who conveyed that general surgery will formally consult at [facility] and did not feel that a left-sided chest tube was indicated at this time.  Plan: Trauma surgery consult at [facility], as above.  As needed Percocet, as needed fentanyl  not relieved by Percocet.  CBC in the morning.  Type and screen ordered.  Every 4 hour H&H's ordered through 1300 on 11/22/2023.  Check INR, PTT.  Hold home Pradaxa  for now.                   #) Ground-level mechanical fall: Ground-level mechanical fall while ambulating at home earlier this evening in the absence of any associated loss of consciousness or apparent head trauma.  However, this fall resulted in multiple traumatic injuries, as outlined above. She has noted to be on chronic anticoagulation via Pradaxa .  This appears to have occurred on a purely mechanical basis, sufficient reports that she tripped relativity ambulate at home.  However, will  also follow for result of urinalysis to evaluate for any additional predisposing factors.   Plan: Work-up and management of presenting acute traumatic injuries, as further described above. Check UA.  Fall precautions.                 #) SIRS criteria present: Presentation associated with mild leukocytosis, tachypnea.  However, in the absence of e/o underlying infection at this time, including, CT chest that showed no evidence of infiltrate, criteria for sepsis not currently met.  suspect non-infectious factors contributing to these SIRS findings, including leukocytosis on the basis of reactive component in the setting of her from a mechanical fall with multiple traumatic injuries. patient appears hemodynamically stable at this time.  Consequently, will refrain from initiation of IV antibiotics at this time.   Plan: Repeat CBC with diff in the AM.  Monitor strict I's and O's and daily weights.  Monitor on telemetry. Refraining from IV abx for now, as above.  Follow for result of urinalysis.                    #) Chronic atrial fibrillation: Documented history of such. In setting of CHA2DS2-VASc score of 4, there is an indication for chronic anticoagulation for thromboembolic prophylaxis. Consistent with this, patient is chronically anticoagulated on Pradaxa . Home AV nodal blocking regimen: Metoprolol  succinate.  Most recent echocardiogram occurred in February 2020 it was notable for LVEF 55 to 60%, indeterminate diastolic pressures, normal right ventricular systolic function, as well as severely dilated left atrium, likely contributing to the chronic nature of her atrial fibrillation. Presenting EKG shows rate controlled atrial fibrillation without overt evidence of acute ischemic changes.  In the setting of her presenting splenic laceration as well as hemopneumothorax, will hold home Pradaxa  for now.  As described above, trauma surgery did not recommend reversal of Pradaxa   at this time.  Plan: monitor strict I's & O's and daily weights. CMP/CBC in AM. Check serum mag level.  Hold home Pradaxa  for now, as above.  Additionally, in the setting of n.p.o. status, will hold home metoprolol  succinate as well.                     #)  Hyperlipidemia: documented h/o such. On pravastatin  as outpatient.   Plan: Hold home statin in the setting of current n.p.o. status.                  #) acquired hypothyroidism: documented h/o such, on Synthroid  as outpatient.   Plan: Holding Synthroid  in setting of n.p.o. status.                    #) Depression: documented h/o such. On Paxil  as outpatient.    Plan: Hold home Paxil  in the setting of current n.p.o. status.                 #) GERD: documented h/o such; on oral Protonix  as outpatient.   Plan: In the setting of current n.p.o. status, will transiently convert her daily oral Protonix  to daily IV Protonix .      DVT prophylaxis: SCD's (holding home Pradaxa  for now) Code Status: Per my discussions with the patient and her granddaughter today, they have expressed that the patient wishes to be DNR.  She would not want chest compressions, antiarrhythmics, electrical shocks, or intubation in the setting of cardiac arrest.  However, patient also conveys that she would be amenable to intubation in a non-cardiac arrest scenario in which there is primary pulmonary indication for such. Family Communication: I discussed the patient's case with her granddaughter, who is present at bedside. Disposition Plan: Per Rounding Team Consults called: EDP d/w on-call neurosurgery, Dr. Sissy Duff, as further detailed above; additionally, EDP discussed patient's case with on-call trauma surgery, Dr.Connor, Who will formally consult at [facility], as further detailed above;  Admission status: Inpatient to [place]    I SPENT GREATER THAN 75  MINUTES IN CLINICAL CARE TIME/MEDICAL  DECISION-MAKING IN COMPLETING THIS ADMISSION.     Gattis Kass Howell Groesbeck DO [facility] From 7PM - 7AM   11/22/2023, 2:18 AM

## 2023-11-22 NOTE — TOC CAGE-AID Note (Signed)
 Transition of Care [facility]) - CAGE-AID Screening  Patient Details  Name: [name] MRN: [id number] Date of Birth: 1926/08/17  Clinical Narrative:  Patient denies any current alcohol or drug use, no need for substance abuse resources at this time.  CAGE-AID Screening:   Have You Ever Felt You Ought to Cut Down on Your Drinking or Drug Use?: No Have People Annoyed You By Critizing Your Drinking Or Drug Use?: No Have You Felt Bad Or Guilty About Your Drinking Or Drug Use?: No Have You Ever Had a Drink or Used Drugs First Thing In The Morning to Steady Your Nerves or to Get Rid of a Hangover?: No CAGE-AID Score: 0  Substance Abuse Education Offered: No

## 2023-11-22 NOTE — ED Notes (Signed)
Surgery rounding at bedside 

## 2023-11-22 NOTE — Consult Note (Addendum)
 Reason for Consult:Fall Referring Physician: Dr Vada Garibaldi [name] is an 89 y.o. female.   HPI: 43 yof with significant PMHof afib, CAD with pacer on pradaxa  fell at home 1/14.  Tripped and fell to floor. She complains of left sided chest pain now.  She was evaluated in the er and found to have a left hptx, rib fx.  She is due to be transferred to [facility] on trauma service.   Past Medical History:  Diagnosis Date   Acid reflux    Arthritis    Atrial fibrillation (HCC)    Breast cancer (HCC) 2011   Left Breast Cancer   Cancer [facility]) 2011   Left Breast Cancer   Depression    Hyperlipidemia    Hypertension    Insomnia    Osteoporosis    Pacemaker 2010   WIITH DEFIB   Pelvic fracture (HCC) 2010   Thyroid  disease     Past Surgical History:  Procedure Laterality Date   ABDOMINAL HYSTERECTOMY  35 yrs ago   BREAST LUMPECTOMY Left 05/23/10   CARDIAC DEFIBRILLATOR PLACEMENT  2 years ago   CHOLECYSTECTOMY  50 YEARS AGO   ELBOW SURGERY  2006    Family History  Problem Relation Age of Onset   Heart disease Mother    Diabetes Father    Stroke Father    Cancer Sister        lung   Cancer Brother        liver   COPD Brother    Heart attack Brother    Stroke Brother    Heart attack Brother     Social History:  reports that she has never smoked. She has never used smokeless tobacco. She reports that she does not drink alcohol and does not use drugs.  Allergies:  Allergies  Allergen Reactions   Codeine Nausea Only   Current Facility-Administered Medications  Medication Dose Route Frequency Provider Last Rate Last Admin   acetaminophen  (TYLENOL ) tablet 650 mg  650 mg Oral Q6H PRN Howerter, Justin B, DO       Or   acetaminophen  (TYLENOL ) suppository 650 mg  650 mg Rectal Q6H PRN Howerter, Justin B, DO       fentaNYL  (SUBLIMAZE ) injection 25 mcg  25 mcg Intravenous Q2H PRN Howerter, Justin B, DO   25 mcg at 11/22/23 4098   melatonin tablet 3 mg  3 mg Oral QHS PRN Howerter,  Justin B, DO       naloxone  (NARCAN ) injection 0.4 mg  0.4 mg Intravenous PRN Howerter, Justin B, DO       ondansetron  (ZOFRAN ) injection 4 mg  4 mg Intravenous Q6H PRN Howerter, Justin B, DO       oxyCODONE -acetaminophen  (PERCOCET/ROXICET) 5-325 MG per tablet 1 tablet  1 tablet Oral Q4H PRN Howerter, Justin B, DO       pantoprazole  (PROTONIX ) injection 40 mg  40 mg Intravenous Q24H Howerter, Justin B, DO       Current Outpatient Medications  Medication Sig Dispense Refill   acetaminophen  (TYLENOL ) 500 MG tablet Take 500 mg by mouth every 8 (eight) hours as needed for mild pain or moderate pain.     Ascorbic Acid (VITAMIN C) 1000 MG tablet Take 500 mg by mouth daily. 1/2 tablet in the morning and 1/2 tablet in the evening.     Calcium  Carb-Cholecalciferol  600-800 MG-UNIT TABS Take 1 tablet by mouth every morning.     furosemide  (LASIX ) 40 MG tablet Take 1  tablet (40 mg total) by mouth as needed. 30 tablet 3   gabapentin  (NEURONTIN ) 600 MG tablet TAKE 1 TABLET BY MOUTH TWICE A DAY AS NEEDED 60 tablet 5   metoprolol  succinate (TOPROL -XL) 25 MG 24 hr tablet Take 25 mg by mouth daily.      Multiple Vitamins-Minerals (ABC PLUS PO) Take 1 tablet by mouth daily.     Omega-3 1000 MG CAPS Take 1 capsule by mouth daily.     pantoprazole  (PROTONIX ) 40 MG tablet TAKE 1 TABLET BY MOUTH ONCE DAILY 90 tablet 3   potassium chloride  (MICRO-K ) 10 MEQ CR capsule Take 1 capsule (10 mEq total) by mouth daily as needed. Along with furosemide  90 capsule 1   PRADAXA  75 MG CAPS capsule Take 75 mg by mouth 2 (two) times daily.      SYNTHROID  88 MCG tablet TAKE 1 TABLET (88 MCG TOTAL) BY MOUTH DAILY BEFORE BREAKFAST ON EMPTY STOMACH 90 tablet 3   UNABLE TO FIND Nutribiotic nasal spray     cholecalciferol  (VITAMIN D3) 25 MCG (1000 UT) tablet Take 1,000 Units by mouth every morning. (Patient not taking: Reported on 11/22/2023)     diclofenac  Sodium (VOLTAREN ) 1 % GEL Apply 2 g topically 4 (four) times daily as needed.  (Patient not taking: Reported on 11/22/2023)     PARoxetine  (PAXIL ) 20 MG tablet TAKE 1 TABLET BY MOUTH DAILY. 90 tablet 1   pravastatin  (PRAVACHOL ) 10 MG tablet TAKE 1 TABLET BY MOUTH DAILY. 90 tablet 1   Probiotic Product (ALIGN) 4 MG CAPS Take 1 capsule (4 mg total) by mouth daily. 30 capsule 3     Results for orders placed or performed during the hospital encounter of 11/21/23 (from the past 48 hours)  CBC with Differential     Status: Abnormal   Collection Time: 11/21/23  7:23 PM  Result Value Ref Range   WBC 17.2 (H) 4.0 - 10.5 K/uL   RBC 4.64 3.87 - 5.11 MIL/uL   Hemoglobin 14.0 12.0 - 15.0 g/dL   HCT 82.9 56.2 - 13.0 %   MCV 96.8 80.0 - 100.0 fL   MCH 30.2 26.0 - 34.0 pg   MCHC 31.2 30.0 - 36.0 g/dL   RDW 86.5 78.4 - 69.6 %   Platelets 250 150 - 400 K/uL   nRBC 0.0 0.0 - 0.2 %   Neutrophils Relative % 83 %   Neutro Abs 14.6 (H) 1.7 - 7.7 K/uL   Lymphocytes Relative 8 %   Lymphs Abs 1.3 0.7 - 4.0 K/uL   Monocytes Relative 6 %   Monocytes Absolute 1.0 0.1 - 1.0 K/uL   Eosinophils Relative 1 %   Eosinophils Absolute 0.1 0.0 - 0.5 K/uL   Basophils Relative 1 %   Basophils Absolute 0.1 0.0 - 0.1 K/uL   Immature Granulocytes 1 %   Abs Immature Granulocytes 0.16 (H) 0.00 - 0.07 K/uL    Comment: Performed at [facility], [address] [place], [place] [address]  Comprehensive metabolic panel     Status: Abnormal   Collection Time: 11/21/23  7:23 PM  Result Value Ref Range   Sodium 139 135 - 145 mmol/L   Potassium 5.2 (H) 3.5 - 5.1 mmol/L    Comment: HEMOLYSIS AT THIS LEVEL MAY AFFECT RESULT   Chloride 108 98 - 111 mmol/L   CO2 21 (L) 22 - 32 mmol/L   Glucose, Bld 181 (H) 70 - 99 mg/dL    Comment: Glucose reference range applies  only to samples taken after fasting for at least 8 hours.   BUN 17 8 - 23 mg/dL   Creatinine, Ser 1.61 0.44 - 1.00 mg/dL   Calcium  9.0 8.9 - 10.3 mg/dL   Total Protein 6.8 6.5 - 8.1 g/dL   Albumin 3.9 3.5 - 5.0 g/dL   AST  42 (H) 15 - 41 U/L    Comment: HEMOLYSIS AT THIS LEVEL MAY AFFECT RESULT   ALT 21 0 - 44 U/L    Comment: HEMOLYSIS AT THIS LEVEL MAY AFFECT RESULT   Alkaline Phosphatase 39 38 - 126 U/L   Total Bilirubin 0.8 0.0 - 1.2 mg/dL    Comment: HEMOLYSIS AT THIS LEVEL MAY AFFECT RESULT   GFR, Estimated >60 >60 mL/min    Comment: (NOTE) Calculated using the CKD-EPI Creatinine Equation (2021)    Anion gap 10 5 - 15    Comment: Performed at [facility], [address] [place], [place] [address]  Troponin I (High Sensitivity)     Status: None   Collection Time: 11/21/23  7:23 PM  Result Value Ref Range   Troponin I (High Sensitivity) 7 <18 ng/L    Comment: (NOTE) Elevated high sensitivity troponin I (hsTnI) values and significant  changes across serial measurements may suggest ACS but many other  chronic and acute conditions are known to elevate hsTnI results.  Refer to the "Links" section for chest pain algorithms and additional  guidance. Performed at [facility], [address], [place], [place] [address]   CBC with Differential/Platelet     Status: Abnormal   Collection Time: 11/22/23  3:39 AM  Result Value Ref Range   WBC 10.7 (H) 4.0 - 10.5 K/uL   RBC 4.19 3.87 - 5.11 MIL/uL   Hemoglobin 12.9 12.0 - 15.0 g/dL   HCT 11.9 14.7 - 82.9 %   MCV 96.2 80.0 - 100.0 fL   MCH 30.8 26.0 - 34.0 pg   MCHC 32.0 30.0 - 36.0 g/dL   RDW 56.2 13.0 - 86.5 %   Platelets 245 150 - 400 K/uL   nRBC 0.0 0.0 - 0.2 %   Neutrophils Relative % 85 %   Neutro Abs 9.1 (H) 1.7 - 7.7 K/uL   Lymphocytes Relative 8 %   Lymphs Abs 0.8 0.7 - 4.0 K/uL   Monocytes Relative 6 %   Monocytes Absolute 0.7 0.1 - 1.0 K/uL   Eosinophils Relative 0 %   Eosinophils Absolute 0.0 0.0 - 0.5 K/uL   Basophils Relative 0 %   Basophils Absolute 0.0 0.0 - 0.1 K/uL   Immature Granulocytes 1 %   Abs Immature Granulocytes 0.06 0.00 - 0.07 K/uL    Comment: Performed at [facility], [address] [place], [place] [address]  Comprehensive metabolic panel     Status: Abnormal   Collection Time: 11/22/23  3:39 AM  Result Value Ref Range   Sodium 138 135 - 145 mmol/L   Potassium 4.5 3.5 - 5.1 mmol/L    Comment: HEMOLYSIS AT THIS LEVEL MAY AFFECT RESULT   Chloride 106 98 - 111 mmol/L   CO2 21 (L) 22 - 32 mmol/L   Glucose, Bld 213 (H) 70 - 99 mg/dL    Comment: Glucose reference range applies only to samples taken after fasting for at least 8 hours.   BUN 19 8 - 23 mg/dL   Creatinine, Ser 6.29 0.44 - 1.00 mg/dL   Calcium  9.5 8.9 - 10.3 mg/dL  Total Protein 6.8 6.5 - 8.1 g/dL   Albumin 4.0 3.5 - 5.0 g/dL   AST 48 (H) 15 - 41 U/L    Comment: HEMOLYSIS AT THIS LEVEL MAY AFFECT RESULT   ALT 26 0 - 44 U/L    Comment: HEMOLYSIS AT THIS LEVEL MAY AFFECT RESULT   Alkaline Phosphatase 40 38 - 126 U/L   Total Bilirubin 0.9 0.0 - 1.2 mg/dL    Comment: HEMOLYSIS AT THIS LEVEL MAY AFFECT RESULT   GFR, Estimated >60 >60 mL/min    Comment: (NOTE) Calculated using the CKD-EPI Creatinine Equation (2021)    Anion gap 11 5 - 15    Comment: Performed at [facility], [address], [place], [place] [address]  Magnesium      Status: None   Collection Time: 11/22/23  3:39 AM  Result Value Ref Range   Magnesium  2.3 1.7 - 2.4 mg/dL    Comment: Performed at [facility], [address], [place], [place] [address]  Type and screen [facility]     Status: None   Collection Time: 11/22/23  3:39 AM  Result Value Ref Range   ABO/RH(D) A NEG    Antibody Screen NEG    Sample Expiration      11/25/2023,2359 Performed at [facility], [address] [place], [place] [address]     CT CHEST ABDOMEN PELVIS W CONTRAST Result Date: 11/22/2023 CLINICAL DATA:  Polytrauma, blunt Fall, wc's 17.2, GFR>60, hx of lt breat ca 2001 EXAM: CT CHEST, ABDOMEN, AND PELVIS WITH CONTRAST TECHNIQUE: Multidetector CT  imaging of the chest, abdomen and pelvis was performed following the standard protocol during bolus administration of intravenous contrast. RADIATION DOSE REDUCTION: This exam was performed according to the departmental dose-optimization program which includes automated exposure control, adjustment of the mA and/or kV according to patient size and/or use of iterative reconstruction technique. CONTRAST:  OMNIPAQUE  IOHEXOL  300 MG/ML  SOLN COMPARISON:  None Available. FINDINGS: CHEST: Cardiovascular: No aortic injury. The thoracic aorta is normal in caliber. Bilateral enlarged atria. No significant pericardial effusion. Left cardiac pacemaker defibrillator. Severe atherosclerotic plaque. At least 3 vessel coronary calcification. Mediastinum/Nodes: No pneumomediastinum. No mediastinal hematoma. The esophagus is unremarkable. The thyroid  is unremarkable. The central airways are patent. No mediastinal, hilar, or axillary lymphadenopathy. Lungs/Pleura: Diffuse bronchial wall thickening. Bilateral lower lobe atelectasis. No focal consolidation. Subpleural micronodule (2:23). No pulmonary mass. No pulmonary contusion or laceration. No pneumatocele formation. Small volume left hemopneumothorax. No right hemothorax. No right pneumothorax. Musculoskeletal/Chest wall: No chest wall mass. Acute displaced left posterior 5 - 9 rib fractures. Old healed left posterior eleventh rib fracture. No sternal fracture. Age-indeterminate T2, T4, T5 superior endplate compression fracture. Up to at least 50% vertebral body height loss at the T4 level. ABDOMEN / PELVIS: Hepatobiliary: Not enlarged. No focal lesion. No laceration or subcapsular hematoma. The gallbladder is otherwise unremarkable with no radio-opaque gallstones. No biliary ductal dilatation. Pancreas: Normal pancreatic contour. No main pancreatic duct dilatation. Spleen: Not enlarged. Slight irregularity of the superior lateral splenic parenchyma with trace perisplenic high  density free fluid (8:46, 10:172). No  subcapsular hematoma or vascular injury. Adrenals/Urinary Tract: No nodularity bilaterally. Bilateral kidneys enhance symmetrically. No hydronephrosis. No contusion, laceration, or subcapsular hematoma. No injury to the vascular structures or collecting systems. No hydroureter. The urinary bladder is unremarkable. Stomach/Bowel: No small or large bowel wall thickening or dilatation. Diffuse colonic diverticulosis. The appendix is not definitely identified with no  inflammatory changes in the right lower quadrant to suggest acute appendicitis. Vasculature/Lymphatics: No abdominal aorta or iliac aneurysm. No active contrast extravasation or pseudoaneurysm. No abdominal, pelvic, inguinal lymphadenopathy. Reproductive: Normal. Other: No simple free fluid ascites. No pneumoperitoneum. No hemoperitoneum. No mesenteric hematoma identified. No organized fluid collection. Musculoskeletal: No significant soft tissue hematoma. No acute pelvic fracture. No spinal fracture. Old healed right pelvic fracture.Multilevel degenerative changes spine. Dextroscoliosis of the lumbar spine. Ports and Devices: None. IMPRESSION: 1. Small volume left hemopneumothorax. 2. Likely AAST grade 1 splenic laceration. 3. Acute displaced left posterior 5 - 9 rib fractures. 4. Age-indeterminate, likely acute, T2, T4, T5 superior endplate compression fracture. Up to at least 50% vertebral body height loss at the T4 level. Correlate with point tenderness to palpation to evaluate for acute component. 5. Other imaging findings of potential clinical significance: Cardiomegaly. Colonic diverticulosis with no acute diverticulitis. Aortic Atherosclerosis (ICD10-I70.0)-with at least 3 vessel coronary calcification. Electronically Signed   By: Morgane  Naveau M.D.   On: 11/22/2023 00:17   DG Pelvis Portable Result Date: 11/21/2023 CLINICAL DATA:  fall EXAM: PORTABLE PELVIS 1-2 VIEWS COMPARISON:  CT lumbar spine  04/29/2011 FINDINGS: Right inferior pubic rami irregularity-chronic unstable. No acute displaced fracture or dislocation of either hips on frontal view. There is no evidence of pelvic fracture or diastasis. No pelvic bone lesions are seen. Degenerative changes visualized lower lumbar spine. IMPRESSION: Negative for acute traumatic injury. Electronically Signed   By: Morgane  Naveau M.D.   On: 11/21/2023 21:33   DG Shoulder Left Result Date: 11/21/2023 CLINICAL DATA:  pain sp fall EXAM: LEFT SHOULDER - 2+ VIEW COMPARISON:  Cxr 11/21/23 FINDINGS: There is no evidence of fracture or dislocation. There is no evidence of arthropathy or other focal bone abnormality. Soft tissues are unremarkable. Left rib fractures better evaluated on chest x-ray. IMPRESSION: No acute displaced fracture or dislocation of the left shoulder. Electronically Signed   By: Morgane  Naveau M.D.   On: 11/21/2023 21:28   CT Head Wo Contrast Addendum Date: 11/21/2023 ADDENDUM REPORT: 11/21/2023 21:25 ADDENDUM: These results were called by telephone at the time of interpretation on 11/21/2023 at 9:24 pm to provider Dr. Gordon Latus, who verbally acknowledged these results. Electronically Signed   By: Morgane  Naveau M.D.   On: 11/21/2023 21:25   Result Date: 11/21/2023 CLINICAL DATA:  witnessed mechanical fall. Did not hit head. No LOC. Takes blood thinner. C/o left arm pain. Given 100 mcg of fentanyl  with EMS. EXAM: CT HEAD WITHOUT CONTRAST CT CERVICAL SPINE WITHOUT CONTRAST TECHNIQUE: Multidetector CT imaging of the head and cervical spine was performed following the standard protocol without intravenous contrast. Multiplanar CT image reconstructions of the cervical spine were also generated. RADIATION DOSE REDUCTION: This exam was performed according to the departmental dose-optimization program which includes automated exposure control, adjustment of the mA and/or kV according to patient size and/or use of iterative reconstruction technique.  COMPARISON:  Ct angion head/neck 12/11/2018 FINDINGS: CT HEAD FINDINGS Brain: Patchy and confluent areas of decreased attenuation are noted throughout the deep and periventricular white matter of the cerebral hemispheres bilaterally, compatible with chronic microvascular ischemic disease. No evidence of large-territorial acute infarction. No parenchymal hemorrhage. No mass lesion. No extra-axial collection. No mass effect or midline shift. No hydrocephalus. Basilar cisterns are patent. Vascular: Atherosclerotic calcifications are present within the cavernous internal carotid arteries. No hyperdense vessel. Skull: No acute fracture or focal lesion. Sinuses/Orbits: Left sphenoid sinus mucosal thickening. Otherwise paranasal sinuses and mastoid air cells are clear.  Bilateral lens replacement. Otherwise the orbits are unremarkable. Other: None. CT CERVICAL SPINE FINDINGS Alignment: Normal. Skull base and vertebrae: No acute fracture. No aggressive appearing focal osseous lesion or focal pathologic process. Soft tissues and spinal canal: No prevertebral fluid or swelling. No visible canal hematoma. Upper chest: Trace left pneumothorax. Other: Atherosclerotic plaque of the aorta and its main branches. IMPRESSION: 1. Trace left pneumothorax. 2. No acute intracranial abnormality. 3. No acute displaced fracture or traumatic listhesis of the cervical spine. 4.  Aortic Atherosclerosis (ICD10-I70.0). Electronically Signed: By: Morgane  Naveau M.D. On: 11/21/2023 21:07   CT Cervical Spine Wo Contrast Addendum Date: 11/21/2023 ADDENDUM REPORT: 11/21/2023 21:25 ADDENDUM: These results were called by telephone at the time of interpretation on 11/21/2023 at 9:24 pm to provider Dr. Gordon Latus, who verbally acknowledged these results. Electronically Signed   By: Morgane  Naveau M.D.   On: 11/21/2023 21:25   Result Date: 11/21/2023 CLINICAL DATA:  witnessed mechanical fall. Did not hit head. No LOC. Takes blood thinner. C/o left arm  pain. Given 100 mcg of fentanyl  with EMS. EXAM: CT HEAD WITHOUT CONTRAST CT CERVICAL SPINE WITHOUT CONTRAST TECHNIQUE: Multidetector CT imaging of the head and cervical spine was performed following the standard protocol without intravenous contrast. Multiplanar CT image reconstructions of the cervical spine were also generated. RADIATION DOSE REDUCTION: This exam was performed according to the departmental dose-optimization program which includes automated exposure control, adjustment of the mA and/or kV according to patient size and/or use of iterative reconstruction technique. COMPARISON:  Ct angion head/neck 12/11/2018 FINDINGS: CT HEAD FINDINGS Brain: Patchy and confluent areas of decreased attenuation are noted throughout the deep and periventricular white matter of the cerebral hemispheres bilaterally, compatible with chronic microvascular ischemic disease. No evidence of large-territorial acute infarction. No parenchymal hemorrhage. No mass lesion. No extra-axial collection. No mass effect or midline shift. No hydrocephalus. Basilar cisterns are patent. Vascular: Atherosclerotic calcifications are present within the cavernous internal carotid arteries. No hyperdense vessel. Skull: No acute fracture or focal lesion. Sinuses/Orbits: Left sphenoid sinus mucosal thickening. Otherwise paranasal sinuses and mastoid air cells are clear. Bilateral lens replacement. Otherwise the orbits are unremarkable. Other: None. CT CERVICAL SPINE FINDINGS Alignment: Normal. Skull base and vertebrae: No acute fracture. No aggressive appearing focal osseous lesion or focal pathologic process. Soft tissues and spinal canal: No prevertebral fluid or swelling. No visible canal hematoma. Upper chest: Trace left pneumothorax. Other: Atherosclerotic plaque of the aorta and its main branches. IMPRESSION: 1. Trace left pneumothorax. 2. No acute intracranial abnormality. 3. No acute displaced fracture or traumatic listhesis of the cervical  spine. 4.  Aortic Atherosclerosis (ICD10-I70.0). Electronically Signed: By: Morgane  Naveau M.D. On: 11/21/2023 21:07   DG Chest 2 View Result Date: 11/21/2023 CLINICAL DATA:  left rib pain. Pt c/o fall with walker and fell onto left side. Complaining of left shoulder pain that radiates to the middle of her back. Unable to remove bra for imaging due to patient pain. EXAM: CHEST - 2 VIEW COMPARISON:  Cxr 07/31/23, chest x-ray 12/11/2018, CT C-spine 11/21/2023 FINDINGS: Two lead left cardiac pacemaker. The heart and mediastinal contours are unchanged. Atherosclerotic plaque. No focal consolidation. No pulmonary edema. No pleural effusion. No pneumothorax. Acute left 5-9 rib fractures. IMPRESSION: 1. Acute left 5-9 rib fractures. 2. Trace left pneumothorax not well visualized. 3. Aortic Atherosclerosis (ICD10-I70.0). Electronically Signed   By: Morgane  Naveau M.D.   On: 11/21/2023 21:21    Review of Systems  Respiratory:  Positive for shortness of breath.  Cardiovascular:  Positive for chest pain.  All other systems reviewed and are negative.  Blood pressure 139/71, pulse 74, temperature 97.6 F (36.4 C), temperature source Oral, resp. rate 20, height 5\' 4"  (1.626 m), weight 75.3 kg, SpO2 96%. Physical Exam Vitals reviewed.  Constitutional:      Appearance: Normal appearance.  HENT:     Head: Normocephalic and atraumatic.     Right Ear: External ear normal.     Left Ear: External ear normal.     Nose: Nose normal.     Mouth/Throat:     Mouth: Mucous membranes are moist.     Pharynx: Oropharynx is clear.  Eyes:     General: No scleral icterus. Cardiovascular:     Rate and Rhythm: Normal rate and regular rhythm.     Pulses: Normal pulses.  Pulmonary:     Effort: Pulmonary effort is normal.  Chest:     Chest wall: Tenderness (left sided) present.  Abdominal:     General: There is no distension.     Palpations: Abdomen is soft.     Tenderness: There is no abdominal tenderness.   Musculoskeletal:     Cervical back: Normal range of motion and neck supple. No tenderness.     Right lower leg: No edema.     Left lower leg: No edema.  Skin:    General: Skin is warm and dry.     Capillary Refill: Capillary refill takes less than 2 seconds.  Neurological:     General: No focal deficit present.     Mental Status: She is alert and oriented to person, place, and time.     Comments: Cannot hear well   Psychiatric:        Mood and Affect: Mood normal.        Behavior: Behavior normal.     Assessment/Plan: Fall Left 5-9 rib fractures- pulm control, pulm toilet Left HTPX-does not need a chest tube now. Will check another film now prior to transfer ? T spine comp fx (chronic?)- nsurgery consulted but these are likely old Likely grade I splenic laceration-will follow hb, dont think needs bedrest Will hold doac and pharm dvt prophylaxis Tx to trauma center  Enid Harry 11/22/2023, 7:04 AM   Addendum: repeat cxr without ptx (portable), hb scheduled to be repeated at 1300

## 2023-11-22 NOTE — Progress Notes (Signed)
 ACP Documentation:  Per my discussions with the patient and her granddaughter today, they have expressed that the patient wishes to be DNR.  She would not want chest compressions, antiarrhythmics, electrical shocks, or intubation in the setting of cardiac arrest.  However, patient also conveys that she would be amenable to intubation in a non-cardiac arrest scernario in which there is primary pulmonary indication for such.   Camelia Cavalier, DO Hospitalist

## 2023-11-22 NOTE — ED Notes (Signed)
 Called phlebotomy to obtain Hemoglobin and hematocrit

## 2023-11-22 NOTE — Plan of Care (Signed)

## 2023-11-22 NOTE — ED Notes (Signed)
 [facility] called.

## 2023-11-23 DIAGNOSIS — S2242XD Multiple fractures of ribs, left side, subsequent encounter for fracture with routine healing: Secondary | ICD-10-CM

## 2023-11-23 LAB — CBC
HCT: 43.2 % (ref 36.0–46.0)
Hemoglobin: 14 g/dL (ref 12.0–15.0)
MCH: 30.7 pg (ref 26.0–34.0)
MCHC: 32.4 g/dL (ref 30.0–36.0)
MCV: 94.7 fL (ref 80.0–100.0)
Platelets: 225 10*3/uL (ref 150–400)
RBC: 4.56 MIL/uL (ref 3.87–5.11)
RDW: 13.8 % (ref 11.5–15.5)
WBC: 11.6 10*3/uL — ABNORMAL HIGH (ref 4.0–10.5)
nRBC: 0 % (ref 0.0–0.2)

## 2023-11-23 MED ORDER — FENTANYL CITRATE PF 50 MCG/ML IJ SOSY
12.5000 ug | PREFILLED_SYRINGE | INTRAMUSCULAR | Status: DC | PRN
Start: 2023-11-23 — End: 2023-11-25

## 2023-11-23 MED ORDER — OXYCODONE HCL 5 MG PO TABS
2.5000 mg | ORAL_TABLET | ORAL | Status: DC | PRN
  Administered 2023-11-23 (×2): 5 mg via ORAL
  Filled 2023-11-23 (×2): qty 1

## 2023-11-23 MED ORDER — METHOCARBAMOL 750 MG PO TABS
750.0000 mg | ORAL_TABLET | Freq: Three times a day (TID) | ORAL | Status: DC
  Administered 2023-11-23 – 2023-11-29 (×18): 750 mg via ORAL
  Filled 2023-11-23 (×18): qty 1

## 2023-11-23 MED ORDER — ENOXAPARIN SODIUM 40 MG/0.4ML IJ SOSY
40.0000 mg | PREFILLED_SYRINGE | INTRAMUSCULAR | Status: DC
  Administered 2023-11-23 – 2023-11-29 (×7): 40 mg via SUBCUTANEOUS
  Filled 2023-11-23 (×7): qty 0.4

## 2023-11-23 MED ORDER — OXYCODONE HCL 5 MG PO TABS: 5.0000 mg | ORAL_TABLET | ORAL | Status: DC | PRN

## 2023-11-23 NOTE — Evaluation (Signed)
Occupational Therapy Evaluation Patient Details Name: [name] MRN: [id number] DOB: 07-11-1926 Today's Date: 11/23/2023   History of Present Illness 89 y.o. female presents to [facility] 11/21/23 after tripping and falling. Pt w/ small acute L HTPX, T2,T4,T5 superior endplate compression fxs likely chronic, L 5-9 rib fxs, grade 1 splenic laceration.  PMHx: arthritis, a-fib, HTN, pacemaker   Clinical Impression   PTA patient reports independent with ADLs/light IADLs, mobility using rollator for mobility. Admitted for above and presents with problem list below.  Pt requires max-total assist +2 for bed mobility, min to total assist +2 for ADLs.  Pt resisting movement when attempting to sit EOB due to rib pain on L side, and asking therapists to stop.  Pt able to recall importance of using incentive spirometer (as trauma PA educated on prior to session). Based on performance today, believe patient will best benefit from continued OT services acutely and after dc at inpatient setting with <3hrs/day to optimize independence, safety with ADLs and mobility.         If plan is discharge home, recommend the following: Two people to help with walking and/or transfers;Two people to help with bathing/dressing/bathroom;Assistance with cooking/housework;Assist for transportation;Help with stairs or ramp for entrance    Functional Status Assessment  Patient has had a recent decline in their functional status and demonstrates the ability to make significant improvements in function in a reasonable and predictable amount of time.  Equipment Recommendations  Other (comment) (defer)    Recommendations for Other Services       Precautions / Restrictions Precautions Precautions: Fall Precaution Comments: back for comfort Restrictions Weight Bearing Restrictions Per Provider Order: No      Mobility Bed Mobility Overal bed mobility: Needs Assistance Bed Mobility: Rolling, Sidelying to Sit, Sit to  Sidelying Rolling: Max assist, Used rails, +2 for physical assistance Sidelying to sit: Total assist, +2 for physical assistance     Sit to sidelying: Total assist, +2 for physical assistance General bed mobility comments: rolled towards R side with max assist +2, assist for BLEs and trunk at EOB. attempted x2 with pt resisting movement, difficulty following commands due to pain and asking therapist to stop.  Returned back to bed with total assist +2    Transfers                   General transfer comment: deferred d/t pain      Balance                                           ADL either performed or assessed with clinical judgement   ADL Overall ADL's : Needs assistance/impaired     Grooming: Sitting;Minimal assistance Grooming Details (indicate cue type and reason): in bed         Upper Body Dressing : Moderate assistance;Bed level   Lower Body Dressing: Total assistance;Bed level;+2 for physical assistance     Toilet Transfer Details (indicate cue type and reason): deferred         Functional mobility during ADLs: Maximal assistance;Total assistance;+2 for physical assistance       Vision   Vision Assessment?: No apparent visual deficits     Perception         Praxis         Pertinent Vitals/Pain Pain Assessment Pain Assessment: Faces Faces Pain Scale: Hurts whole lot Pain  Location: L ribs Pain Descriptors / Indicators: Discomfort, Grimacing, Guarding, Moaning Pain Intervention(s): Limited activity within patient's tolerance, Monitored during session, Repositioned     Extremity/Trunk Assessment Upper Extremity Assessment Upper Extremity Assessment: Generalized weakness;Right hand dominant;LUE deficits/detail LUE Deficits / Details: limited shoulder flexion due to rib pain LUE: Unable to fully assess due to pain LUE Sensation: WNL LUE Coordination: decreased gross motor   Lower Extremity Assessment Lower Extremity  Assessment: Defer to PT evaluation   Cervical / Trunk Assessment Cervical / Trunk Assessment: Other exceptions Cervical / Trunk Exceptions: rib fxs on L, chronic compression fxs   Communication Communication Communication: Difficulty following commands/understanding Following commands: Follows one step commands consistently;Follows one step commands with increased time;Follows multi-step commands inconsistently Cueing Techniques: Verbal cues;Tactile cues   Cognition Arousal: Alert Behavior During Therapy: WFL for tasks assessed/performed Overall Cognitive Status: Impaired/Different from baseline Area of Impairment: Following commands, Awareness, Problem solving, Attention                   Current Attention Level: Sustained   Following Commands: Follows one step commands consistently, Follows one step commands with increased time, Follows multi-step commands inconsistently   Awareness: Emergent Problem Solving: Slow processing, Decreased initiation, Difficulty sequencing, Requires verbal cues General Comments: internally distracted by pain, follows simple commands but requires cueing for safety, task completion and problem sovling.  Not formally assessed.     General Comments  on 2L upon entry, weaned to RA with SPO2 >95%    Exercises     Shoulder Instructions      Home Living Family/patient expects to be discharged to:: Private residence Living Arrangements: Other (Comment) (granddaughter and her family) Available Help at Discharge: Family Type of Home: House Home Access: Stairs to enter Secretary/administrator of Steps: 5 Entrance Stairs-Rails: Right;Left Home Layout: Two level;Able to live on main level with bedroom/bathroom;Full bath on main level     Bathroom Shower/Tub: Tub only;Walk-in shower   Bathroom Toilet: Handicapped height     Home Equipment: Agricultural consultant (2 wheels);Rollator (4 wheels);Shower seat;Shower seat - built in   Additional Comments:  granddaughter is a Runner, broadcasting/film/video,  her husband works from home      Prior Functioning/Environment Prior Level of Function : Independent/Modified Independent             Mobility Comments: uses a rollator for mobility; reports does not leave the house without assist ADLs Comments: indepednent ADLs, light iADLs, and showering standing        OT Problem List: Decreased strength;Decreased activity tolerance;Impaired balance (sitting and/or standing);Pain;Impaired UE functional use;Decreased knowledge of precautions;Decreased knowledge of use of DME or AE;Decreased safety awareness;Decreased cognition      OT Treatment/Interventions: Self-care/ADL training;Therapeutic exercise;DME and/or AE instruction;Therapeutic activities;Patient/family education;Balance training    OT Goals(Current goals can be found in the care plan section) Acute Rehab OT Goals Patient Stated Goal: less pain OT Goal Formulation: With patient Time For Goal Achievement: 12/07/23 Potential to Achieve Goals: Fair  OT Frequency: Min 1X/week    Co-evaluation PT/OT/SLP Co-Evaluation/Treatment: Yes Reason for Co-Treatment: For patient/therapist safety;To address functional/ADL transfers   OT goals addressed during session: ADL's and self-care      AM-PAC OT "6 Clicks" Daily Activity     Outcome Measure Help from another person eating meals?: A Little Help from another person taking care of personal grooming?: A Little Help from another person toileting, which includes using toliet, bedpan, or urinal?: Total Help from another person bathing (including washing, rinsing, drying)?: A  Lot Help from another person to put on and taking off regular upper body clothing?: A Lot Help from another person to put on and taking off regular lower body clothing?: Total 6 Click Score: 12   End of Session Nurse Communication: Mobility status  Activity Tolerance: Patient limited by pain Patient left: in bed;with call bell/phone  within reach;with bed alarm set  OT Visit Diagnosis: Other abnormalities of gait and mobility (R26.89);Muscle weakness (generalized) (M62.81);Pain;History of falling (Z91.81) Pain - Right/Left: Left Pain - part of body:  (ribs)                Time: 2952-8413 OT Time Calculation (min): 26 min Charges:  OT General Charges $OT Visit: 1 Visit OT Evaluation $OT Eval Moderate Complexity: 1 Mod  Barry Brunner, OT Acute Rehabilitation Services Office [phone]   Chancy Milroy 11/23/2023, 11:22 AM

## 2023-11-23 NOTE — TOC Initial Note (Signed)
Transition of Care [facility]) - Initial/Assessment Note    Patient Details  Name: [name] MRN: [id number] Date of Birth: 03-26-1926  Transition of Care [facility]) CM/SW Contact:    Alicia Frederick, LCSW Phone Number: 11/23/2023, 3:59 PM  Clinical Narrative:     Pt listed as oriented x4 but unable to participate in conversation.  CSW spoke with daughters [name] and [name], both in room.  2 adult grandsons also present, Scientist, product/process development.  Discussed PT recommendation for SNF and daughters do not think they want to pursue this.  Pt from home, lives with granddaughter [name] and they believe they can have enough family support in home for pt to DC home with [facility].  Pt also has long term care insurance and they are considering putting HH aide in place.  CSW reviewed PT note with family showing pt total assist +2 and did not get out of bed.  Family was not aware of this, after more discussion, they would like to consider their options overnight.  Medicare choice document for SNF provided, also Care.com website for [facility] aide assistance.  TOC will continue to follow.                Expected Discharge Plan:  (TBD) Barriers to Discharge: Continued Medical Work up, Other (must enter comment) (family considering SNF vs HH)   Patient Goals and CMS Choice   CMS Medicare.gov Compare Post Acute Care list provided to:: Patient Represenative (must comment) (daughters [name] and [name]) Choice offered to / list presented to : Adult Children (daughters [name] and [name])      Expected Discharge Plan and Services In-house Referral: Clinical Social Work   Post Acute Care Choice:  (TBD-family attempting to put plan in place for pt to DC home) Living arrangements for the past 2 months: Single Family Home                                      Prior Living Arrangements/Services Living arrangements for the past 2 months: Single Family Home Lives with:: Relatives (lives with granddaughter [name]) Patient language and  need for interpreter reviewed:: Yes        Need for Family Participation in Patient Care: Yes (Comment) Care giver support system in place?: Yes (comment) Current home services: Other (comment) (none) Criminal Activity/Legal Involvement Pertinent to Current Situation/Hospitalization: No - Comment as needed  Activities of Daily Living   ADL Screening (condition at time of admission) Independently performs ADLs?: Yes (appropriate for developmental age) Is the patient deaf or have difficulty hearing?: Yes (hearing aids) Does the patient have difficulty seeing, even when wearing glasses/contacts?: No Does the patient have difficulty concentrating, remembering, or making decisions?: Yes  Permission Sought/Granted                  Emotional Assessment Appearance:: Appears stated age Attitude/Demeanor/Rapport: Unable to Assess Affect (typically observed): Unable to Assess Orientation: : Oriented to Self, Oriented to Place, Oriented to  Time, Oriented to Situation      Admission diagnosis:  Hemopneumothorax [J94.2] Hyperkalemia [E87.5] Chronic anticoagulation [Z79.01] Multiple rib fractures [S22.49XA] Laceration of spleen, initial encounter [S36.039A] Fall, initial encounter [W19.XXXA] Closed fracture of multiple ribs of left side, initial encounter [S22.42XA] Compression fracture of thoracic vertebra, unspecified thoracic vertebral level, initial encounter [facility]) [S22.000A] Patient Active Problem List   Diagnosis Date Noted   Multiple rib fractures 11/22/2023  Fall 11/22/2023   Splenic laceration 11/22/2023   Mult fractures of thoracic spine, closed (HCC) 11/22/2023   SIRS (systemic inflammatory response syndrome) (HCC) 11/22/2023   Hemopneumothorax 11/22/2023   Compression fracture of thoracic vertebra (HCC) 11/22/2023   Arthritis 05/31/2022   Cancer (HCC) 05/31/2022   Depression 05/31/2022   HOH (hard of hearing) 05/31/2022   Insomnia 05/31/2022   Acute foot pain,  right 05/19/2022   Chronic pain of both knees 05/19/2022   Prediabetes 05/19/2022   Class 1 obesity due to excess calories with serious comorbidity and body mass index (BMI) of 30.0 to 30.9 in adult 05/19/2022   Body mass index (BMI) of 30.0-30.9 in adult 05/19/2022   Long term current use of anticoagulant 11/03/2021   Chronic atrial fibrillation with RVR (HCC)    Allergic rhinitis 11/06/2019   Arthralgia of left temporomandibular joint 05/15/2019   Left ear pain 05/15/2019   Stroke-like symptoms 12/11/2018   Essential hypertension 12/11/2018   HLD (hyperlipidemia) 12/11/2018   Acquired hypothyroidism 12/11/2018   AICD (automatic cardioverter/defibrillator) present 12/11/2018   GERD (gastroesophageal reflux disease) 12/11/2018   Anxiety 12/11/2018   Elevated brain natriuretic peptide (BNP) level 12/11/2018   Hyperglycemia 12/11/2018   Sensorineural hearing loss (SNHL) of both ears 04/26/2017   Tonsillar cyst 04/26/2017   Generalized anxiety disorder 01/02/2017   Chronic right-sided low back pain with right-sided sciatica 01/02/2017   Spondylosis without myelopathy or radiculopathy, lumbar region 01/02/2017   Lumbar radiculopathy 01/02/2017   Implantable cardioverter-defibrillator (ICD) generator end of life 02/10/2016   Fatigue 01/15/2016   Ventricular tachycardia, unspecified (HCC) 07/14/2015   History of left breast cancer 06/03/2011   PCP:  Caesar Bookman, NP Pharmacy:   [facility] - [place], [place] - [address] Marye Round [place] [address] Phone: [phone] Fax: [phone]     Social Drivers of Health (SDOH) Social History: SDOH Screenings   Food Insecurity: No Food Insecurity (11/22/2023)  Housing: Low Risk  (11/22/2023)  Transportation Needs: No Transportation Needs (11/22/2023)  Utilities: Not At Risk (11/22/2023)  Depression (PHQ2-9): Low Risk  (08/16/2023)  Social Connections: Moderately Isolated (11/22/2023)  Tobacco Use:  Low Risk  (11/22/2023)   SDOH Interventions:     Readmission Risk Interventions     No data to display

## 2023-11-23 NOTE — Progress Notes (Signed)
[name]  UVO:[id number] DOB: 03-01-1926 DOA: 11/21/2023 PCP: Caesar Bookman, NP    Brief Narrative:  89 year old with a history of chronic atrial fibrillation on Pradaxa, CAD, pacemaker placement, HTN, and HLD who presented to the [facility] ER 1/14 after suffering a mechanical fall.  Workup in the ER revealed multiple left-sided rib fractures, a suspected splenic laceration, acute thoracic compression fractures, and a small left hemopneumothorax.  The patient did not require an immediate chest tube.  She was seen by trauma surgery and transferred to [facility] to be followed by the trauma team.  Goals of Care:   Code Status: Do not attempt resuscitation (DNR) PRE-ARREST INTERVENTIONS DESIRED   DVT prophylaxis: enoxaparin (LOVENOX) injection 40 mg Start: 11/23/23 1215 SCDs Start: 11/22/23 0208   Interim Hx: The patient was transferred from [facility] yesterday in order to be followed by the Trauma Service.  She is afebrile with stable vitals.  Hemoglobin is holding steady.  She is doing remarkably well today.  She is alert and conversant.  She reports expected pain.  She states she has a reasonable appetite.  She denies any shortness of breath.  Assessment & Plan:  Simple mechanical fall No evidence to suggest anything more complicated -PT/OT following  Multiple left-sided rib fractures with small hemopneumothorax Pneumothorax spontaneously resolved on follow-up CXR -hemoglobin holding steady -no indication for chest tube thus far -continue incentive spirometer  Suspected splenic laceration Holding DOAC - hemoglobin stable thus far -care per Trauma Service -cleared by surgery to utilize pharmacologic DVT prophylaxis today  Acute thoracic compression fractures Evaluated by trauma service and Neurosurgery -there is a thought that these may in fact be chronic and not acute - nonetheless Neurosurgery has recommended nonsurgical conservative treatment -begin PT/OT  Chronic atrial  fibrillation On Pradaxa at home which is presently on hold -discussed risk-benefit analysis favors holding Pradaxa for now with patient and daughter at bedside  HTN Blood pressure currently well-controlled  HLD Continue pravastatin  Mood disorder Continue paroxetine  Family Communication: Spoke with daughter at bedside Disposition: Will need SNF rehab stay   Objective: Blood pressure 135/78, pulse 76, temperature 98 F (36.7 C), temperature source Oral, resp. rate 15, height 5\' 4"  (1.626 m), weight 75.3 kg, SpO2 93%. No intake or output data in the 24 hours ending 11/23/23 1630 Filed Weights   11/21/23 1809  Weight: 75.3 kg    Examination: General: No acute respiratory distress Lungs: Clear to auscultation bilaterally without wheezes or crackles Cardiovascular: Regular rate and rhythm without murmur gallop or rub normal S1 and S2 Abdomen: Nontender, nondistended, soft, bowel sounds positive, no rebound, no ascites, no appreciable mass Extremities: No significant cyanosis, clubbing, or edema bilateral lower extremities  CBC: Recent Labs  Lab 11/21/23 1923 11/22/23 0339 11/22/23 1342 11/23/23 0937  WBC 17.2* 10.7*  --  11.6*  NEUTROABS 14.6* 9.1*  --   --   HGB 14.0 12.9 12.3 14.0  HCT 44.9 40.3 37.7 43.2  MCV 96.8 96.2  --  94.7  PLT 250 245  --  225   Basic Metabolic Panel: Recent Labs  Lab 11/21/23 1923 11/22/23 0339  NA 139 138  K 5.2* 4.5  CL 108 106  CO2 21* 21*  GLUCOSE 181* 213*  BUN 17 19  CREATININE 0.70 0.66  CALCIUM 9.0 9.5  MG  --  2.3   GFR: Estimated Creatinine Clearance: 41.8 mL/min (by C-G formula based on SCr of 0.66 mg/dL).   Scheduled Meds:  acetaminophen  1,000 mg Oral Q6H   enoxaparin (LOVENOX) injection  40 mg Subcutaneous Q24H   gabapentin  300 mg Oral BID   levothyroxine  88 mcg Oral QAC breakfast   lidocaine  1 patch Transdermal Q24H   methocarbamol  750 mg Oral TID   metoprolol succinate  25 mg Oral Daily   pantoprazole   40 mg Oral Daily   PARoxetine  20 mg Oral Daily      LOS: 1 day   Lonia Blood, MD [facility] Office  [phone] Pager - Text Page per Loretha Stapler  If 7PM-7AM, please contact night-coverage per Amion 11/23/2023, 4:30 PM

## 2023-11-23 NOTE — Evaluation (Signed)
Physical Therapy Evaluation Patient Details Name: [name] MRN: [id number] DOB: October 22, 1926 Today's Date: 11/23/2023  History of Present Illness  89 y.o. female presents to [facility] 11/21/23 after tripping and falling. Pt w/ small acute L HTPX, T2,T4,T5 superior endplate compression fxs likely chronic, L 5-9 rib fxs, grade 1 splenic laceration.  PMHx: arthritis, a-fib, HTN, pacemaker   Clinical Impression  Pt in bed upon arrival and agreeable to PT eval. Prior to admit, pt would use a rollator for mobility, however, would not leave the house without family close by for assistance as needed. Pt reports two prior falls in the past 6 months where she trips. In today's session, pt required MaxAx2 to roll and TotalAx2 to sit upright. Pt was unable to tolerate sitting EOB despite being pre-medicated. Pt would resist movement and ask to lay back down with two attempts. Pt presents to therapy session with decreased LE strength, balance, mobility, and history of falls. Pt would benefit from acute skilled PT to address functional impairments. Recommending post-acute rehab <3hrs to gain independence with mobility. Acute PT to follow.          If plan is discharge home, recommend the following: A lot of help with walking and/or transfers;A lot of help with bathing/dressing/bathroom;Assist for transportation;Help with stairs or ramp for entrance;Assistance with cooking/housework   Can travel by private vehicle   No    Equipment Recommendations Other (comment) (TBD at next venue)     Functional Status Assessment Patient has had a recent decline in their functional status and demonstrates the ability to make significant improvements in function in a reasonable and predictable amount of time.     Precautions / Restrictions Precautions Precautions: Fall Precaution Comments: back for comfort Restrictions Weight Bearing Restrictions Per Provider Order: No      Mobility  Bed Mobility Overal bed  mobility: Needs Assistance Bed Mobility: Rolling, Sidelying to Sit, Sit to Sidelying Rolling: Max assist, Used rails, +2 for physical assistance Sidelying to sit: Total assist, +2 for physical assistance     Sit to sidelying: Total assist, +2 for physical assistance General bed mobility comments: Roll to the R side with MaxAx2, assist for BLEs and trunk at EOB. Attempted x2 with pt resisting movement and grabbing rails to keep from moving. difficulty following commands due to pain and asking therapist to stop.  Returned back to bed with total assist +2    Transfers    General transfer comment: deferred d/t pain      Balance Overall balance assessment: Needs assistance, History of Falls, Mild deficits observed, not formally tested Sitting-balance support: Bilateral upper extremity supported, Feet supported Sitting balance-Leahy Scale: Zero Sitting balance - Comments: required TotalAx2 to move into sitting, unable to maintain balance d/t pain        Pertinent Vitals/Pain Pain Assessment Pain Assessment: Faces Faces Pain Scale: Hurts whole lot Pain Location: L ribs Pain Descriptors / Indicators: Discomfort, Grimacing, Guarding, Moaning Pain Intervention(s): Limited activity within patient's tolerance, Monitored during session, Premedicated before session, Repositioned    Home Living Family/patient expects to be discharged to:: Private residence Living Arrangements: Other (Comment) (granddaughter and her family) Available Help at Discharge: Family Type of Home: House Home Access: Stairs to enter Entrance Stairs-Rails: Doctor, general practice of Steps: 5   Home Layout: Two level;Able to live on main level with bedroom/bathroom;Full bath on main level Home Equipment: Rolling Walker (2 wheels);Rollator (4 wheels);Shower seat;Shower seat - built in Additional Comments: granddaughter is a Runner, broadcasting/film/video,  her  husband works from home    Prior Function Prior Level of Function :  Independent/Modified Independent      Mobility Comments: uses a rollator for mobility; reports does not leave the house without assist ADLs Comments: indepedent ADLs, light iADLs, and showering standing     Extremity/Trunk Assessment   Upper Extremity Assessment Upper Extremity Assessment: Defer to OT evaluation LUE Deficits / Details: limited shoulder flexion due to rib pain LUE: Unable to fully assess due to pain LUE Sensation: WNL LUE Coordination: decreased gross motor    Lower Extremity Assessment Lower Extremity Assessment: Generalized weakness    Cervical / Trunk Assessment Cervical / Trunk Assessment: Other exceptions Cervical / Trunk Exceptions: rib fxs on L, chronic compression fxs  Communication   Communication Communication: Difficulty following commands/understanding Following commands: Follows one step commands consistently;Follows one step commands inconsistently Cueing Techniques: Verbal cues;Tactile cues  Cognition Arousal: Alert Behavior During Therapy: WFL for tasks assessed/performed Overall Cognitive Status: Impaired/Different from baseline Area of Impairment: Following commands, Awareness, Problem solving, Attention      Current Attention Level: Sustained   Following Commands: Follows one step commands consistently, Follows one step commands with increased time, Follows multi-step commands inconsistently   Awareness: Emergent Problem Solving: Slow processing, Decreased initiation, Difficulty sequencing, Requires verbal cues General Comments: requires multiple cues for safety and to complete task at hand. Internally distracted by pain        General Comments General comments (skin integrity, edema, etc.): on 2L upon entry, weaned to RA with SpO2 >95%     PT Assessment Patient needs continued PT services  PT Problem List Decreased strength;Decreased range of motion;Decreased balance;Decreased activity tolerance;Decreased mobility;Decreased safety  awareness       PT Treatment Interventions DME instruction;Gait training;Stair training;Functional mobility training;Therapeutic activities;Therapeutic exercise;Balance training;Neuromuscular re-education;Patient/family education;Wheelchair mobility training    PT Goals (Current goals can be found in the Care Plan section)  Acute Rehab PT Goals Patient Stated Goal: to get stronger PT Goal Formulation: With patient Time For Goal Achievement: 12/07/23 Potential to Achieve Goals: Fair    Frequency Min 1X/week     Co-evaluation   Reason for Co-Treatment: For patient/therapist safety;To address functional/ADL transfers PT goals addressed during session: Mobility/safety with mobility;Balance OT goals addressed during session: ADL's and self-care       AM-PAC PT "6 Clicks" Mobility  Outcome Measure Help needed turning from your back to your side while in a flat bed without using bedrails?: A Lot Help needed moving from lying on your back to sitting on the side of a flat bed without using bedrails?: Total Help needed moving to and from a bed to a chair (including a wheelchair)?: Total Help needed standing up from a chair using your arms (e.g., wheelchair or bedside chair)?: Total Help needed to walk in hospital room?: Total Help needed climbing 3-5 steps with a railing? : Total 6 Click Score: 7    End of Session   Activity Tolerance: Patient limited by pain Patient left: in bed;with call bell/phone within reach;with bed alarm set Nurse Communication: Mobility status PT Visit Diagnosis: Unsteadiness on feet (R26.81);History of falling (Z91.81);Muscle weakness (generalized) (M62.81)    Time: 1027-2536 PT Time Calculation (min) (ACUTE ONLY): 27 min   Charges:   PT Evaluation $PT Eval Low Complexity: 1 Low   PT General Charges $$ ACUTE PT VISIT: 1 Visit         Hilton Cork, PT, DPT Secure Chat Preferred  Rehab Office [phone]  Arturo Morton Brion Aliment  11/23/2023, 11:49 AM

## 2023-11-23 NOTE — Plan of Care (Signed)

## 2023-11-23 NOTE — Progress Notes (Signed)
Patient ID: [name], female   DOB: 12/08/1925, 89 y.o.   MRN: [id number] See other note from today. I checked on her this PM and she reports it was rough working with therapies but she will keep trying. I spoke with her family as well. They brought her hearing aids which is helpful.  [name], MD, MPH, FACS Please use [email or web address] to contact on call provider

## 2023-11-23 NOTE — Progress Notes (Signed)
Subjective: CC: Complaints of L rib pain. No sob at rest. Has not been oob. About to work with PT/OT. Pulling 250 on IS. On 2L. Does not wear o2 at home. Denies tobacco use.   No other areas of pain. Denies abdominal pain or back pain. No n/t in the lower extremities.   Tolerating diet without n/v. No I/O documented. Reports she is voiding.   Afebrile. No tachycardia or hypotension. Labs pending this am.  Still using IV pain medications. Did not take po prn narcotic pain meds yesterday. Discussed utilizing PO medications before IV pain medications. She reports her pain has improved today after a lidocaine patch was applied.   Lives at home with her granddaughter. There are ~4-5 stairs to enter the home. Her bedroom and bathroom are on the first floor. She walks with a walker at baseline. 2 falls in the last 6 months.    Objective: Vital signs in last 24 hours: Temp:  [97.8 F (36.6 C)-99 F (37.2 C)] 98 F (36.7 C) (01/16 0424) Pulse Rate:  [60-79] 70 (01/16 0732) Resp:  [14-22] 15 (01/16 0424) BP: (120-150)/(58-82) 150/72 (01/16 0732) SpO2:  [90 %-100 %] 98 % (01/16 0732) Last BM Date : 11/21/23  Intake/Output from previous day: No intake/output data recorded. Intake/Output this shift: No intake/output data recorded.  PE: Gen:  Alert, NAD, pleasant HEENT: Normocephalic, atraumatic Neck: No C-spine ttp or step offs Card:  RRR. Ext wwp x 4 Pulm:  CTAB, no W/R/R, effort normal. On RA. Pulling 250 on IS.  Abd: Soft, ND, NT, +BS Psych: A&Ox4 Skin: Warm and dry. No abrasions, bruising or lacerations noted  Neuro: Non-focal. MAE's. SILT to BUE and BLE. CN 3-12 grossly intact Msk:  RUE: No gross deformities of joints or skin unless otherwise mentioned above. Able  passive/active shoulder, elbow, wrist and digits range of motion without pain.  No tenderness over clavicle, shoulder, upper arm, elbow, forearm, wrists or hand.  LUE: No gross deformities of joints or skin  unless otherwise mentioned above. Able passive/active elbow, wrist and hand range of motion without pain.  No tenderness over clavicle, shoulder, upper arm, elbow, forearm, wrists or hand. She is hesitant to range left shoulder 2/2 rib pain.  RLE: No gross deformities of joints or skin unless otherwise mentioned above. Able  passive/active range of motion of hip, knee and ankle without pain.  No tenderness over hip, upper legs, knee, lower leg, ankle or foot.  No lower extremity edema.   LLE: No gross deformities of joints or skin unless otherwise mentioned above. Able  passive/active range of motion of hip, knee and ankle without pain.  No tenderness over hip, upper legs, knee, lower leg, ankle or feet.  No lower extremity edema.    Lab Results:  Recent Labs    11/21/23 1923 11/22/23 0339 11/22/23 1342  WBC 17.2* 10.7*  --   HGB 14.0 12.9 12.3  HCT 44.9 40.3 37.7  PLT 250 245  --    BMET Recent Labs    11/21/23 1923 11/22/23 0339  NA 139 138  K 5.2* 4.5  CL 108 106  CO2 21* 21*  GLUCOSE 181* 213*  BUN 17 19  CREATININE 0.70 0.66  CALCIUM 9.0 9.5   PT/INR No results for input(s): "LABPROT", "INR" in the last 72 hours. CMP     Component Value Date/Time   NA 138 11/22/2023 0339   K 4.5 11/22/2023 0339   CL 106 11/22/2023  0339   CO2 21 (L) 11/22/2023 0339   GLUCOSE 213 (H) 11/22/2023 0339   BUN 19 11/22/2023 0339   CREATININE 0.66 11/22/2023 0339   CREATININE 0.78 05/22/2023 0954   CALCIUM 9.5 11/22/2023 0339   PROT 6.8 11/22/2023 0339   ALBUMIN 4.0 11/22/2023 0339   AST 48 (H) 11/22/2023 0339   ALT 26 11/22/2023 0339   ALKPHOS 40 11/22/2023 0339   BILITOT 0.9 11/22/2023 0339   GFRNONAA >60 11/22/2023 0339   GFRNONAA 71 12/08/2020 0901   GFRAA 83 12/08/2020 0901   Lipase     Component Value Date/Time   LIPASE 22 12/25/2017 0922    Studies/Results: DG CHEST PORT 1 VIEW Result Date: 11/22/2023 CLINICAL DATA:  89 year old female with history of recent fall with  left-sided chest pain. Evaluate for pneumothorax. EXAM: PORTABLE CHEST 1 VIEW COMPARISON:  Chest x-ray 11/21/2023. FINDINGS: Multiple left-sided rib fractures are again noted (see report for chest CT 11/21/2023 for full description). No appreciable pneumothorax is identified. Opacity at the left base which may reflect atelectasis and/or consolidation with superimposed small left pleural effusion (corresponding to known hemothorax). Right lung is clear. No right pleural effusion. No evidence of pulmonary edema. Heart size is mildly enlarged. Upper mediastinal contours are within normal limits. Left-sided pacemaker/AICD noted with lead tips projecting over the expected location of the right atrium and right ventricle. Atherosclerotic calcifications in the thoracic aorta. IMPRESSION: 1. Multiple left-sided rib fractures redemonstrated. Associated with this is some atelectasis and/or consolidation in the left lung base with superimposed small left pleural effusion (known hemothorax). No appreciable pneumothorax identified. 2. Mild cardiomegaly. 3. Aortic atherosclerosis. Electronically Signed   By: Trudie Reed M.D.   On: 11/22/2023 07:39   CT CHEST ABDOMEN PELVIS W CONTRAST Result Date: 11/22/2023 CLINICAL DATA:  Polytrauma, blunt Fall, wc's 17.2, GFR>60, hx of lt breat ca 2001 EXAM: CT CHEST, ABDOMEN, AND PELVIS WITH CONTRAST TECHNIQUE: Multidetector CT imaging of the chest, abdomen and pelvis was performed following the standard protocol during bolus administration of intravenous contrast. RADIATION DOSE REDUCTION: This exam was performed according to the departmental dose-optimization program which includes automated exposure control, adjustment of the mA and/or kV according to patient size and/or use of iterative reconstruction technique. CONTRAST:  OMNIPAQUE IOHEXOL 300 MG/ML  SOLN COMPARISON:  None Available. FINDINGS: CHEST: Cardiovascular: No aortic injury. The thoracic aorta is normal in caliber.  Bilateral enlarged atria. No significant pericardial effusion. Left cardiac pacemaker defibrillator. Severe atherosclerotic plaque. At least 3 vessel coronary calcification. Mediastinum/Nodes: No pneumomediastinum. No mediastinal hematoma. The esophagus is unremarkable. The thyroid is unremarkable. The central airways are patent. No mediastinal, hilar, or axillary lymphadenopathy. Lungs/Pleura: Diffuse bronchial wall thickening. Bilateral lower lobe atelectasis. No focal consolidation. Subpleural micronodule (2:23). No pulmonary mass. No pulmonary contusion or laceration. No pneumatocele formation. Small volume left hemopneumothorax. No right hemothorax. No right pneumothorax. Musculoskeletal/Chest wall: No chest wall mass. Acute displaced left posterior 5 - 9 rib fractures. Old healed left posterior eleventh rib fracture. No sternal fracture. Age-indeterminate T2, T4, T5 superior endplate compression fracture. Up to at least 50% vertebral body height loss at the T4 level. ABDOMEN / PELVIS: Hepatobiliary: Not enlarged. No focal lesion. No laceration or subcapsular hematoma. The gallbladder is otherwise unremarkable with no radio-opaque gallstones. No biliary ductal dilatation. Pancreas: Normal pancreatic contour. No main pancreatic duct dilatation. Spleen: Not enlarged. Slight irregularity of the superior lateral splenic parenchyma with trace perisplenic high density free fluid (8:46, 10:172). No  subcapsular hematoma or vascular  injury. Adrenals/Urinary Tract: No nodularity bilaterally. Bilateral kidneys enhance symmetrically. No hydronephrosis. No contusion, laceration, or subcapsular hematoma. No injury to the vascular structures or collecting systems. No hydroureter. The urinary bladder is unremarkable. Stomach/Bowel: No small or large bowel wall thickening or dilatation. Diffuse colonic diverticulosis. The appendix is not definitely identified with no inflammatory changes in the right lower quadrant to suggest  acute appendicitis. Vasculature/Lymphatics: No abdominal aorta or iliac aneurysm. No active contrast extravasation or pseudoaneurysm. No abdominal, pelvic, inguinal lymphadenopathy. Reproductive: Normal. Other: No simple free fluid ascites. No pneumoperitoneum. No hemoperitoneum. No mesenteric hematoma identified. No organized fluid collection. Musculoskeletal: No significant soft tissue hematoma. No acute pelvic fracture. No spinal fracture. Old healed right pelvic fracture.Multilevel degenerative changes spine. Dextroscoliosis of the lumbar spine. Ports and Devices: None. IMPRESSION: 1. Small volume left hemopneumothorax. 2. Likely AAST grade 1 splenic laceration. 3. Acute displaced left posterior 5 - 9 rib fractures. 4. Age-indeterminate, likely acute, T2, T4, T5 superior endplate compression fracture. Up to at least 50% vertebral body height loss at the T4 level. Correlate with point tenderness to palpation to evaluate for acute component. 5. Other imaging findings of potential clinical significance: Cardiomegaly. Colonic diverticulosis with no acute diverticulitis. Aortic Atherosclerosis (ICD10-I70.0)-with at least 3 vessel coronary calcification. Electronically Signed   By: Tish Frederickson M.D.   On: 11/22/2023 00:17   DG Pelvis Portable Result Date: 11/21/2023 CLINICAL DATA:  fall EXAM: PORTABLE PELVIS 1-2 VIEWS COMPARISON:  CT lumbar spine 04/29/2011 FINDINGS: Right inferior pubic rami irregularity-chronic unstable. No acute displaced fracture or dislocation of either hips on frontal view. There is no evidence of pelvic fracture or diastasis. No pelvic bone lesions are seen. Degenerative changes visualized lower lumbar spine. IMPRESSION: Negative for acute traumatic injury. Electronically Signed   By: Tish Frederickson M.D.   On: 11/21/2023 21:33   DG Shoulder Left Result Date: 11/21/2023 CLINICAL DATA:  pain sp fall EXAM: LEFT SHOULDER - 2+ VIEW COMPARISON:  Cxr 11/21/23 FINDINGS: There is no evidence of  fracture or dislocation. There is no evidence of arthropathy or other focal bone abnormality. Soft tissues are unremarkable. Left rib fractures better evaluated on chest x-ray. IMPRESSION: No acute displaced fracture or dislocation of the left shoulder. Electronically Signed   By: Tish Frederickson M.D.   On: 11/21/2023 21:28   CT Head Wo Contrast Addendum Date: 11/21/2023 ADDENDUM REPORT: 11/21/2023 21:25 ADDENDUM: These results were called by telephone at the time of interpretation on 11/21/2023 at 9:24 pm to provider Dr. Renaye Rakers, who verbally acknowledged these results. Electronically Signed   By: Tish Frederickson M.D.   On: 11/21/2023 21:25   Result Date: 11/21/2023 CLINICAL DATA:  witnessed mechanical fall. Did not hit head. No LOC. Takes blood thinner. C/o left arm pain. Given 100 mcg of fentanyl with EMS. EXAM: CT HEAD WITHOUT CONTRAST CT CERVICAL SPINE WITHOUT CONTRAST TECHNIQUE: Multidetector CT imaging of the head and cervical spine was performed following the standard protocol without intravenous contrast. Multiplanar CT image reconstructions of the cervical spine were also generated. RADIATION DOSE REDUCTION: This exam was performed according to the departmental dose-optimization program which includes automated exposure control, adjustment of the mA and/or kV according to patient size and/or use of iterative reconstruction technique. COMPARISON:  Ct angion head/neck 12/11/2018 FINDINGS: CT HEAD FINDINGS Brain: Patchy and confluent areas of decreased attenuation are noted throughout the deep and periventricular white matter of the cerebral hemispheres bilaterally, compatible with chronic microvascular ischemic disease. No evidence of large-territorial acute infarction. No parenchymal  hemorrhage. No mass lesion. No extra-axial collection. No mass effect or midline shift. No hydrocephalus. Basilar cisterns are patent. Vascular: Atherosclerotic calcifications are present within the cavernous internal carotid  arteries. No hyperdense vessel. Skull: No acute fracture or focal lesion. Sinuses/Orbits: Left sphenoid sinus mucosal thickening. Otherwise paranasal sinuses and mastoid air cells are clear. Bilateral lens replacement. Otherwise the orbits are unremarkable. Other: None. CT CERVICAL SPINE FINDINGS Alignment: Normal. Skull base and vertebrae: No acute fracture. No aggressive appearing focal osseous lesion or focal pathologic process. Soft tissues and spinal canal: No prevertebral fluid or swelling. No visible canal hematoma. Upper chest: Trace left pneumothorax. Other: Atherosclerotic plaque of the aorta and its main branches. IMPRESSION: 1. Trace left pneumothorax. 2. No acute intracranial abnormality. 3. No acute displaced fracture or traumatic listhesis of the cervical spine. 4.  Aortic Atherosclerosis (ICD10-I70.0). Electronically Signed: By: Tish Frederickson M.D. On: 11/21/2023 21:07   CT Cervical Spine Wo Contrast Addendum Date: 11/21/2023 ADDENDUM REPORT: 11/21/2023 21:25 ADDENDUM: These results were called by telephone at the time of interpretation on 11/21/2023 at 9:24 pm to provider Dr. Renaye Rakers, who verbally acknowledged these results. Electronically Signed   By: Tish Frederickson M.D.   On: 11/21/2023 21:25   Result Date: 11/21/2023 CLINICAL DATA:  witnessed mechanical fall. Did not hit head. No LOC. Takes blood thinner. C/o left arm pain. Given 100 mcg of fentanyl with EMS. EXAM: CT HEAD WITHOUT CONTRAST CT CERVICAL SPINE WITHOUT CONTRAST TECHNIQUE: Multidetector CT imaging of the head and cervical spine was performed following the standard protocol without intravenous contrast. Multiplanar CT image reconstructions of the cervical spine were also generated. RADIATION DOSE REDUCTION: This exam was performed according to the departmental dose-optimization program which includes automated exposure control, adjustment of the mA and/or kV according to patient size and/or use of iterative reconstruction  technique. COMPARISON:  Ct angion head/neck 12/11/2018 FINDINGS: CT HEAD FINDINGS Brain: Patchy and confluent areas of decreased attenuation are noted throughout the deep and periventricular white matter of the cerebral hemispheres bilaterally, compatible with chronic microvascular ischemic disease. No evidence of large-territorial acute infarction. No parenchymal hemorrhage. No mass lesion. No extra-axial collection. No mass effect or midline shift. No hydrocephalus. Basilar cisterns are patent. Vascular: Atherosclerotic calcifications are present within the cavernous internal carotid arteries. No hyperdense vessel. Skull: No acute fracture or focal lesion. Sinuses/Orbits: Left sphenoid sinus mucosal thickening. Otherwise paranasal sinuses and mastoid air cells are clear. Bilateral lens replacement. Otherwise the orbits are unremarkable. Other: None. CT CERVICAL SPINE FINDINGS Alignment: Normal. Skull base and vertebrae: No acute fracture. No aggressive appearing focal osseous lesion or focal pathologic process. Soft tissues and spinal canal: No prevertebral fluid or swelling. No visible canal hematoma. Upper chest: Trace left pneumothorax. Other: Atherosclerotic plaque of the aorta and its main branches. IMPRESSION: 1. Trace left pneumothorax. 2. No acute intracranial abnormality. 3. No acute displaced fracture or traumatic listhesis of the cervical spine. 4.  Aortic Atherosclerosis (ICD10-I70.0). Electronically Signed: By: Tish Frederickson M.D. On: 11/21/2023 21:07   DG Chest 2 View Result Date: 11/21/2023 CLINICAL DATA:  left rib pain. Pt c/o fall with walker and fell onto left side. Complaining of left shoulder pain that radiates to the middle of her back. Unable to remove bra for imaging due to patient pain. EXAM: CHEST - 2 VIEW COMPARISON:  Cxr 07/31/23, chest x-ray 12/11/2018, CT C-spine 11/21/2023 FINDINGS: Two lead left cardiac pacemaker. The heart and mediastinal contours are unchanged. Atherosclerotic  plaque. No focal consolidation. No pulmonary edema.  No pleural effusion. No pneumothorax. Acute left 5-9 rib fractures. IMPRESSION: 1. Acute left 5-9 rib fractures. 2. Trace left pneumothorax not well visualized. 3. Aortic Atherosclerosis (ICD10-I70.0). Electronically Signed   By: Tish Frederickson M.D.   On: 11/21/2023 21:21    Anti-infectives: Anti-infectives (From admission, onward)    None        Assessment/Plan Fall Left 5-9 rib fractures- pain control, pulm toilet. Therapies.  Left HTPX- CXR 1/15 w/ small left pleural effusion/hemothorax. No PTX. No indication for chest tube at this time. On 2L now. Wean to RA. O2 desat trial with PT/OT this am. Pulm toilet.  ? T spine comp fx (chronic?)- Per [facility] note, Neurosurgery (Dr. Conchita Paris) consulted and recommended nonsurgical treatment with conservative measures alone  Likely grade I splenic laceration- okay to mobilize. NT on exam. Hgb stable. Pradaxa on hold.   Per primary team CAD  SSS s/p pacemaker  A. Fib on chronic anticoagulation with Pradaxa - would hold until follows up with cardiology to discuss if risk vs benefits of continuing given hx of 2 falls in the last 6 months.  HTN HLD Hypothyroidism   FEN - On FLD. Okay for reg diet from our standpoint VTE - SCDs, Pradaxa on hold. Okay for chem ppx with lovenox.  ID - None  I reviewed nursing notes, hospitalist notes, last 24 h vitals and pain scores, last 48 h intake and output, last 24 h labs and trends, and last 24 h imaging results.   LOS: 1 day    Jacinto Halim , [facility] 11/23/2023, 7:55 AM Please see Amion for pager number during day hours 7:00am-4:30pm

## 2023-11-24 DIAGNOSIS — S2242XD Multiple fractures of ribs, left side, subsequent encounter for fracture with routine healing: Secondary | ICD-10-CM | POA: Diagnosis not present

## 2023-11-24 LAB — CBC
HCT: 39.6 % (ref 36.0–46.0)
Hemoglobin: 12.8 g/dL (ref 12.0–15.0)
MCH: 30.8 pg (ref 26.0–34.0)
MCHC: 32.3 g/dL (ref 30.0–36.0)
MCV: 95.2 fL (ref 80.0–100.0)
Platelets: 190 10*3/uL (ref 150–400)
RBC: 4.16 MIL/uL (ref 3.87–5.11)
RDW: 13.9 % (ref 11.5–15.5)
WBC: 10.1 10*3/uL (ref 4.0–10.5)
nRBC: 0 % (ref 0.0–0.2)

## 2023-11-24 LAB — COMPREHENSIVE METABOLIC PANEL
ALT: 42 U/L (ref 0–44)
AST: 47 U/L — ABNORMAL HIGH (ref 15–41)
Albumin: 3.1 g/dL — ABNORMAL LOW (ref 3.5–5.0)
Alkaline Phosphatase: 44 U/L (ref 38–126)
Anion gap: 9 (ref 5–15)
BUN: 17 mg/dL (ref 8–23)
CO2: 23 mmol/L (ref 22–32)
Calcium: 9.1 mg/dL (ref 8.9–10.3)
Chloride: 104 mmol/L (ref 98–111)
Creatinine, Ser: 0.67 mg/dL (ref 0.44–1.00)
GFR, Estimated: >60 mL/min (ref >60)
Glucose, Bld: 128 mg/dL — ABNORMAL HIGH (ref 70–99)
Potassium: 4.2 mmol/L (ref 3.5–5.1)
Sodium: 136 mmol/L (ref 135–145)
Total Bilirubin: 1.2 mg/dL (ref 0.0–1.2)
Total Protein: 5.8 g/dL — ABNORMAL LOW (ref 6.5–8.1)

## 2023-11-24 LAB — MAGNESIUM: Magnesium: 2.1 mg/dL (ref 1.7–2.4)

## 2023-11-24 NOTE — Progress Notes (Signed)
[name]  ZOX:[id number] DOB: June 06, 1926 DOA: 11/21/2023 PCP: Caesar Bookman, NP    Brief Narrative:  89 year old with a history of chronic atrial fibrillation on Pradaxa, CAD, pacemaker placement, HTN, and HLD who presented to the [facility] ER 1/14 after suffering a mechanical fall.  Workup in the ER revealed multiple left-sided rib fractures, a suspected splenic laceration, acute thoracic compression fractures, and a small left hemopneumothorax.  The patient did not require an immediate chest tube.  She was seen by trauma surgery and transferred to [facility] to be followed by the trauma team.  Goals of Care:   Code Status: Do not attempt resuscitation (DNR) PRE-ARREST INTERVENTIONS DESIRED   DVT prophylaxis: enoxaparin (LOVENOX) injection 40 mg Start: 11/23/23 1215 SCDs Start: 11/22/23 0208   Interim Hx: No acute events recorded overnight.  Afebrile.  Vital signs stable.  Hemoglobin holding steady.  Alert and conversant.  In good spirits.  Denies any new complaints.  States pain is reasonably well-controlled.  Appetite improving as his intake.  Assessment & Plan:  Simple mechanical fall No evidence to suggest anything more complicated - PT/OT following -will need to reconsider if patient is an appropriate candidate for ongoing anticoagulation given advanced age and recent propensity for falls  Multiple left-sided rib fractures with small hemopneumothorax Pneumothorax spontaneously resolved on follow-up CXR - hemoglobin holding steady - no indication for chest tube thus far -continue incentive spirometer  Suspected splenic laceration Holding DOAC for foreseeable future - hemoglobin stable thus far -care per Trauma Service -cleared by surgery to utilize pharmacologic DVT prophylaxis 1/16  Acute thoracic compression fractures Evaluated by Trauma Service and Neurosurgery -there is a thought that these may in fact be chronic and not acute - nonetheless Neurosurgery has recommended  nonsurgical conservative treatment -continue PT/OT  Chronic atrial fibrillation On Pradaxa at home which is presently on hold -discussed with patient and daughter that risk-benefit analysis favors holding Pradaxa for now, and possibly permanently  HTN Blood pressure currently well-controlled -no indication for strict control -avoid hypotension  HLD Continue pravastatin  Mood disorder Continue paroxetine  Family Communication: No family present at time of my exam Disposition: Therapy has recommended SNF rehab stay - the patient has extensive family support and they are considering taking her home instead with HH   Objective: Blood pressure (!) 124/55, pulse 73, temperature (!) 97.5 F (36.4 C), temperature source Oral, resp. rate 17, height 5\' 4"  (1.626 m), weight 75.3 kg, SpO2 93%. No intake or output data in the 24 hours ending 11/24/23 0820 Filed Weights   11/21/23 1809  Weight: 75.3 kg    Examination: General: No acute respiratory distress Lungs: Clear to auscultation bilaterally without wheezes  Cardiovascular: Regular rate and rhythm without murmur  Abdomen: Nontender, nondistended, soft, bowel sounds positive, no rebound, no ascites Extremities: No significant edema bilateral lower extremities  CBC: Recent Labs  Lab 11/21/23 1923 11/22/23 0339 11/22/23 1342 11/23/23 0937 11/24/23 0643  WBC 17.2* 10.7*  --  11.6* 10.1  NEUTROABS 14.6* 9.1*  --   --   --   HGB 14.0 12.9 12.3 14.0 12.8  HCT 44.9 40.3 37.7 43.2 39.6  MCV 96.8 96.2  --  94.7 95.2  PLT 250 245  --  225 190   Basic Metabolic Panel: Recent Labs  Lab 11/21/23 1923 11/22/23 0339 11/24/23 0643  NA 139 138 136  K 5.2* 4.5 4.2  CL 108 106 104  CO2 21* 21* 23  GLUCOSE 181* 213* 128*  BUN 17 19 17   CREATININE 0.70 0.66 0.67  CALCIUM 9.0 9.5 9.1  MG  --  2.3 2.1   GFR: Estimated Creatinine Clearance: 41.8 mL/min (by C-G formula based on SCr of 0.67 mg/dL).   Scheduled Meds:  acetaminophen   1,000 mg Oral Q6H   enoxaparin (LOVENOX) injection  40 mg Subcutaneous Q24H   gabapentin  300 mg Oral BID   levothyroxine  88 mcg Oral QAC breakfast   lidocaine  1 patch Transdermal Q24H   methocarbamol  750 mg Oral TID   metoprolol succinate  25 mg Oral Daily   pantoprazole  40 mg Oral Daily   PARoxetine  20 mg Oral Daily      LOS: 2 days   Lonia Blood, MD [facility] Office  [phone] Pager - Text Page per Loretha Stapler  If 7PM-7AM, please contact night-coverage per Amion 11/24/2023, 8:20 AM

## 2023-11-24 NOTE — Progress Notes (Signed)
RE:  [name]       Date of Birth: 2026/10/17      Date:   11/24/23       To Whom It May Concern:  Please be advised that the above-named patient will require a short-term nursing home stay - anticipated 30 days or less for rehabilitation and strengthening.  The plan is for return home.                 MD signature                Date

## 2023-11-24 NOTE — [place] FL2 (Signed)
[place] MEDICAID FL2 LEVEL OF CARE FORM     IDENTIFICATION  Patient Name: [name] Birthdate: 1926-02-22 Sex: female Admission Date (Current Location): 11/21/2023  [facility] and [place] Number:  Producer, television/film/video and Address:  [facility] [place], [address], [place], [place] [address]      Provider Number: [id number]  Attending Physician Name and Address:  Lonia Blood, MD  Relative Name and Phone Number:  Evie Lacks Daughter [phone]    Current Level of Care: Hospital Recommended Level of Care: Skilled Nursing Facility Prior Approval Number:    Date Approved/Denied:   PASRR Number:    Discharge Plan: SNF    Current Diagnoses: Patient Active Problem List   Diagnosis Date Noted   Multiple rib fractures 11/22/2023   Fall 11/22/2023   Splenic laceration 11/22/2023   Mult fractures of thoracic spine, closed (HCC) 11/22/2023   SIRS (systemic inflammatory response syndrome) (HCC) 11/22/2023   Hemopneumothorax 11/22/2023   Compression fracture of thoracic vertebra (HCC) 11/22/2023   Arthritis 05/31/2022   Cancer (HCC) 05/31/2022   Depression 05/31/2022   HOH (hard of hearing) 05/31/2022   Insomnia 05/31/2022   Acute foot pain, right 05/19/2022   Chronic pain of both knees 05/19/2022   Prediabetes 05/19/2022   Class 1 obesity due to excess calories with serious comorbidity and body mass index (BMI) of 30.0 to 30.9 in adult 05/19/2022   Body mass index (BMI) of 30.0-30.9 in adult 05/19/2022   Long term current use of anticoagulant 11/03/2021   Chronic atrial fibrillation with RVR (HCC)    Allergic rhinitis 11/06/2019   Arthralgia of left temporomandibular joint 05/15/2019   Left ear pain 05/15/2019   Stroke-like symptoms 12/11/2018   Essential hypertension 12/11/2018   HLD (hyperlipidemia) 12/11/2018   Acquired hypothyroidism 12/11/2018   AICD (automatic cardioverter/defibrillator) present 12/11/2018   GERD (gastroesophageal  reflux disease) 12/11/2018   Anxiety 12/11/2018   Elevated brain natriuretic peptide (BNP) level 12/11/2018   Hyperglycemia 12/11/2018   Sensorineural hearing loss (SNHL) of both ears 04/26/2017   Tonsillar cyst 04/26/2017   Generalized anxiety disorder 01/02/2017   Chronic right-sided low back pain with right-sided sciatica 01/02/2017   Spondylosis without myelopathy or radiculopathy, lumbar region 01/02/2017   Lumbar radiculopathy 01/02/2017   Implantable cardioverter-defibrillator (ICD) generator end of life 02/10/2016   Fatigue 01/15/2016   Ventricular tachycardia, unspecified (HCC) 07/14/2015   History of left breast cancer 06/03/2011    Orientation RESPIRATION BLADDER Height & Weight     Self, Time, Situation, Place  Normal Continent Weight: 166 lb 0.1 oz (75.3 kg) Height:  5\' 4"  (162.6 cm)  BEHAVIORAL SYMPTOMS/MOOD NEUROLOGICAL BOWEL NUTRITION STATUS      Continent Diet (see discharge summary)  AMBULATORY STATUS COMMUNICATION OF NEEDS Skin   Total Care Verbally Normal                       Personal Care Assistance Level of Assistance  Bathing, Feeding, Dressing, Total care Bathing Assistance: Maximum assistance Feeding assistance: Limited assistance Dressing Assistance: Maximum assistance Total Care Assistance: Maximum assistance   Functional Limitations Info  Sight, Hearing, Speech Sight Info: Adequate Hearing Info: Impaired Speech Info: Adequate    SPECIAL CARE FACTORS FREQUENCY  PT (By licensed PT), OT (By licensed OT)     PT Frequency: 5x week OT Frequency: 5x week            Contractures Contractures Info: Not present    Additional  Factors Info  Code Status, Allergies Code Status Info: DNR Allergies Info: Codeine           Current Medications (11/24/2023):  This is the current hospital active medication list Current Facility-Administered Medications  Medication Dose Route Frequency Provider Last Rate Last Admin   acetaminophen (TYLENOL)  tablet 1,000 mg  1,000 mg Oral Q6H Emelia Loron, MD   1,000 mg at 11/24/23 1018   enoxaparin (LOVENOX) injection 40 mg  40 mg Subcutaneous Q24H Lonia Blood, MD   40 mg at 11/23/23 1229   fentaNYL (SUBLIMAZE) injection 12.5-25 mcg  12.5-25 mcg Intravenous Q2H PRN Lonia Blood, MD       gabapentin (NEURONTIN) capsule 300 mg  300 mg Oral BID Emelia Loron, MD   300 mg at 11/24/23 1018   levothyroxine (SYNTHROID) tablet 88 mcg  88 mcg Oral QAC breakfast Emelia Loron, MD   88 mcg at 11/24/23 0541   lidocaine (LIDODERM) 5 % 1 patch  1 patch Transdermal Q24H Emelia Loron, MD   1 patch at 11/24/23 1018   melatonin tablet 3 mg  3 mg Oral QHS PRN Howerter, Justin B, DO       methocarbamol (ROBAXIN) tablet 750 mg  750 mg Oral TID Lonia Blood, MD   750 mg at 11/24/23 1018   metoprolol succinate (TOPROL-XL) 24 hr tablet 25 mg  25 mg Oral Daily Emelia Loron, MD   25 mg at 11/24/23 1018   naloxone [facility]) injection 0.4 mg  0.4 mg Intravenous PRN Howerter, Justin B, DO       ondansetron (ZOFRAN) injection 4 mg  4 mg Intravenous Q6H PRN Howerter, Justin B, DO       oxyCODONE (Oxy IR/ROXICODONE) immediate release tablet 2.5-5 mg  2.5-5 mg Oral Q4H PRN Lonia Blood, MD   5 mg at 11/23/23 2135   pantoprazole (PROTONIX) EC tablet 40 mg  40 mg Oral Daily Emelia Loron, MD   40 mg at 11/24/23 1018   PARoxetine (PAXIL) tablet 20 mg  20 mg Oral Daily Emelia Loron, MD   20 mg at 11/24/23 1018     Discharge Medications: Please see discharge summary for a list of discharge medications.  Relevant Imaging Results:  Relevant Lab Results:   Additional Information SS#: [id number]  Lorri Frederick, LCSW

## 2023-11-24 NOTE — Progress Notes (Signed)
Subjective: CC: Complaints of L rib pain. Pain is improved and better controlled today. No longer requiring IV pain medications. No sob at rest. Pulling 250-375 on IS. Weaned to RA. No other areas of pain. Denies abdominal pain or back pain. No n/t in the lower extremities. Tolerating diet without n/v. About to eat french toast for breakfast. No BM. Reports she is voiding. Amber urine noted in cannister. She denies any dysuria.   MaxAx2 to roll and TotalAx2 to sit upright with PT yesterday. Currently recommended for SNF. Family is deciding between SNF and HH.   Objective: Vital signs in last 24 hours: Temp:  [97.5 F (36.4 C)-98.1 F (36.7 C)] 97.5 F (36.4 C) (01/17 0729) Pulse Rate:  [62-78] 73 (01/17 0729) Resp:  [16-17] 17 (01/17 0541) BP: (110-152)/(55-83) 124/55 (01/17 0729) SpO2:  [93 %-96 %] 93 % (01/17 0729) Last BM Date : 11/21/23  Intake/Output from previous day: No intake/output data recorded. Intake/Output this shift: No intake/output data recorded.  PE: Gen:  Alert, NAD, pleasant Card:  RRR. Ext wwp x 4 Pulm:  CTAB, no W/R/R, effort normal. On RA. Pulling 250-375 on IS.  Abd: Soft, ND, NT, +BS Psych: A&Ox4 Skin: Warm and dry. No abrasions, bruising or lacerations noted  Neuro: Non-focal. MAE's. SILT to BUE and BLE. CN 3-12 grossly intact Msk: No LE edema   Lab Results:  Recent Labs    11/23/23 0937 11/24/23 0643  WBC 11.6* 10.1  HGB 14.0 12.8  HCT 43.2 39.6  PLT 225 190   BMET Recent Labs    11/22/23 0339 11/24/23 0643  NA 138 136  K 4.5 4.2  CL 106 104  CO2 21* 23  GLUCOSE 213* 128*  BUN 19 17  CREATININE 0.66 0.67  CALCIUM 9.5 9.1   PT/INR No results for input(s): "LABPROT", "INR" in the last 72 hours. CMP     Component Value Date/Time   NA 136 11/24/2023 0643   K 4.2 11/24/2023 0643   CL 104 11/24/2023 0643   CO2 23 11/24/2023 0643   GLUCOSE 128 (H) 11/24/2023 0643   BUN 17 11/24/2023 0643   CREATININE 0.67 11/24/2023 0643    CREATININE 0.78 05/22/2023 0954   CALCIUM 9.1 11/24/2023 0643   PROT 5.8 (L) 11/24/2023 0643   ALBUMIN 3.1 (L) 11/24/2023 0643   AST 47 (H) 11/24/2023 0643   ALT 42 11/24/2023 0643   ALKPHOS 44 11/24/2023 0643   BILITOT 1.2 11/24/2023 0643   GFRNONAA >60 11/24/2023 0643   GFRNONAA 71 12/08/2020 0901   GFRAA 83 12/08/2020 0901   Lipase     Component Value Date/Time   LIPASE 22 12/25/2017 0922    Studies/Results: No results found.   Anti-infectives: Anti-infectives (From admission, onward)    None        Assessment/Plan Fall Left 5-9 rib fractures- pain control, pulm toilet. Therapies.  Left HTPX- CXR 1/15 w/ small left pleural effusion/hemothorax. No PTX. No indication for chest tube at this time. Weaned to RA. Pulm toilet.  ? T spine comp fx (chronic?)- Per TRH note, Neurosurgery (Dr. Conchita Paris) consulted and recommended nonsurgical treatment with conservative measures alone  Likely grade I splenic laceration- okay to mobilize. NT on exam. Hgb 14 > 12.9 > 14 > 12.8. No tachycardia or systolic hypotension. Will confirm with my attending no further hgb are indicated unless clinical or hemodynamic change. Pradaxa on hold.   Per primary team CAD  SSS s/p pacemaker  A.  Fib on chronic anticoagulation with Pradaxa - would hold until follows up with cardiology to discuss if risk vs benefits of continuing given hx of 2 falls in the last 6 months.  HTN HLD Hypothyroidism   FEN - Reg VTE - SCDs, Pradaxa on hold. On Lovenox for chem ppx.  ID - None Dispo - Therapies. Per primary. Family deciding between SNF and HH. Our team will be available as needed moving forward. Please call back with any questions or concerns.   I reviewed nursing notes, hospitalist notes, last 24 h vitals and pain scores, last 48 h intake and output, last 24 h labs and trends, and last 24 h imaging results.   LOS: 2 days    Jacinto Halim , [facility] Surgery 11/24/2023, 8:51  AM Please see Amion for pager number during day hours 7:00am-4:30pm

## 2023-11-24 NOTE — TOC Progression Note (Signed)
Transition of Care [facility]) - Progression Note    Patient Details  Name: [name] MRN: [id number] Date of Birth: [birth year]  Transition of Care [facility]) CM/SW Contact  Lorri Frederick, LCSW Phone Number: 11/24/2023, 11:05 AM  Clinical Narrative:   CSW spoke with pt daughter [name].  Family is still working on plan for DC home with [facility].  They have not completely ruled out SNF.  Discussed that would need to send out SNF referral as backup plan and [name] is agreeable to that, only wants referral to high star rated facilities.      Expected Discharge Plan:  (TBD) Barriers to Discharge: Continued Medical Work up, Other (must enter comment) (family considering SNF vs HH)  Expected Discharge Plan and Services In-house Referral: Clinical Social Work   Post Acute Care Choice:  (TBD-family attempting to put plan in place for pt to DC home) Living arrangements for the past 2 months: Single Family Home                                       Social Determinants of Health (SDOH) Interventions SDOH Screenings   Food Insecurity: No Food Insecurity (11/22/2023)  Housing: Low Risk  (11/22/2023)  Transportation Needs: No Transportation Needs (11/22/2023)  Utilities: Not At Risk (11/22/2023)  Depression (PHQ2-9): Low Risk  (08/16/2023)  Social Connections: Moderately Isolated (11/22/2023)  Tobacco Use: Low Risk  (11/22/2023)    Readmission Risk Interventions     No data to display

## 2023-11-24 NOTE — Plan of Care (Signed)

## 2023-11-25 DIAGNOSIS — S2242XD Multiple fractures of ribs, left side, subsequent encounter for fracture with routine healing: Secondary | ICD-10-CM | POA: Diagnosis not present

## 2023-11-25 MED ORDER — LIDOCAINE 5 % EX PTCH
1.0000 | MEDICATED_PATCH | CUTANEOUS | Status: DC
  Administered 2023-11-25 – 2023-11-28 (×4): 1 via TRANSDERMAL
  Filled 2023-11-25 (×4): qty 1

## 2023-11-25 MED ORDER — BISACODYL 10 MG RE SUPP: 10.0000 mg | Freq: Every day | RECTAL | Status: DC | PRN

## 2023-11-25 MED ORDER — SENNOSIDES-DOCUSATE SODIUM 8.6-50 MG PO TABS
1.0000 | ORAL_TABLET | Freq: Two times a day (BID) | ORAL | Status: DC
Start: 2023-11-25 — End: 2023-11-29
  Administered 2023-11-25 – 2023-11-28 (×5): 1 via ORAL
  Filled 2023-11-25 (×7): qty 1

## 2023-11-25 MED ORDER — MAGNESIUM CITRATE PO SOLN: 0.5000 | Freq: Once | ORAL | Status: DC | PRN

## 2023-11-25 MED ORDER — POLYETHYLENE GLYCOL 3350 17 G PO PACK
17.0000 g | PACK | Freq: Two times a day (BID) | ORAL | Status: DC
  Administered 2023-11-25 – 2023-11-28 (×5): 17 g via ORAL
  Filled 2023-11-25 (×5): qty 1

## 2023-11-25 MED ORDER — OXYCODONE HCL 5 MG PO TABS
2.5000 mg | ORAL_TABLET | ORAL | Status: DC | PRN
  Administered 2023-11-25 – 2023-11-29 (×10): 5 mg via ORAL
  Filled 2023-11-25 (×10): qty 1

## 2023-11-25 NOTE — Progress Notes (Signed)
[name]  JYN:[id number] DOB: 1926-05-31 DOA: 11/21/2023 PCP: Caesar Bookman, NP    Brief Narrative:  89 year old with a history of chronic atrial fibrillation on Pradaxa, CAD, pacemaker placement, HTN, and HLD who presented to the [facility] ER 1/14 after suffering a mechanical fall.  Workup in the ER revealed multiple left-sided rib fractures, a suspected splenic laceration, acute thoracic compression fractures, and a small left hemopneumothorax.  The patient did not require an immediate chest tube.  She was seen by trauma surgery and transferred to [facility] to be followed by the trauma team.  Goals of Care:   Code Status: Do not attempt resuscitation (DNR) PRE-ARREST INTERVENTIONS DESIRED   DVT prophylaxis: enoxaparin (LOVENOX) injection 40 mg Start: 11/23/23 1215 SCDs Start: 11/22/23 0208   Interim Hx: No acute events were reported overnight.  Afebrile.  Vital signs stable.  The patient is in good spirits alert and interactive.  She has no new complaints today.    Assessment & Plan:  Simple mechanical fall No evidence to suggest anything more complicated - PT/OT following -will need to reconsider if patient is an appropriate candidate for ongoing anticoagulation given advanced age and recent propensity for falls  Multiple left-sided rib fractures with small hemopneumothorax Pneumothorax spontaneously resolved on follow-up CXR - hemoglobin holding steady - no indication for chest tube -continue incentive spirometer  Suspected splenic laceration Holding DOAC for foreseeable future - hemoglobin stable thus far -care per Trauma Service -cleared by Surgery to utilize pharmacologic DVT prophylaxis 1/16 -initiated without any deleterious effect thus far  Acute thoracic compression fractures Evaluated by Trauma Service and Neurosurgery -there is a thought that these may in fact be chronic and not acute - nonetheless Neurosurgery has recommended nonsurgical conservative treatment -continue  PT/OT  Chronic atrial fibrillation On Pradaxa at home which is presently on hold -discussed with patient and family that risk-benefit analysis favors holding Pradaxa for now, and possibly permanently  HTN Blood pressure currently well-controlled -no indication for strict control -avoid hypotension  HLD Continue pravastatin  Mood disorder Continue paroxetine  Family Communication: Spoke with multiple family members at bedside and answered multiple questions Disposition: Therapy has recommended SNF rehab stay - the patient has extensive family support and they are considering taking her home instead with HH   Objective: Blood pressure (!) 135/49, pulse 63, temperature 97.7 F (36.5 C), resp. rate 17, height 5\' 4"  (1.626 m), weight 77.7 kg, SpO2 94%.  Intake/Output Summary (Last 24 hours) at 11/25/2023 0806 Last data filed at 11/25/2023 0453 Gross per 24 hour  Intake 480 ml  Output 1700 ml  Net -1220 ml   Filed Weights   11/21/23 1809 11/25/23 0503  Weight: 75.3 kg 77.7 kg    Examination: General: No acute respiratory distress Lungs: Clear to auscultation B -no wheezing Cardiovascular: Regular rate and rhythm  Abdomen: Nontender, nondistended, soft, bowel sounds positive, no rebound, no ascites Extremities: No significant edema bilateral lower extremities  CBC: Recent Labs  Lab 11/21/23 1923 11/22/23 0339 11/22/23 1342 11/23/23 0937 11/24/23 0643  WBC 17.2* 10.7*  --  11.6* 10.1  NEUTROABS 14.6* 9.1*  --   --   --   HGB 14.0 12.9 12.3 14.0 12.8  HCT 44.9 40.3 37.7 43.2 39.6  MCV 96.8 96.2  --  94.7 95.2  PLT 250 245  --  225 190   Basic Metabolic Panel: Recent Labs  Lab 11/21/23 1923 11/22/23 0339 11/24/23 0643  NA 139 138 136  K 5.2*  4.5 4.2  CL 108 106 104  CO2 21* 21* 23  GLUCOSE 181* 213* 128*  BUN 17 19 17   CREATININE 0.70 0.66 0.67  CALCIUM 9.0 9.5 9.1  MG  --  2.3 2.1   GFR: Estimated Creatinine Clearance: 42.4 mL/min (by C-G formula based on  SCr of 0.67 mg/dL).   Scheduled Meds:  acetaminophen  1,000 mg Oral Q6H   enoxaparin (LOVENOX) injection  40 mg Subcutaneous Q24H   gabapentin  300 mg Oral BID   levothyroxine  88 mcg Oral QAC breakfast   lidocaine  1 patch Transdermal Q24H   methocarbamol  750 mg Oral TID   metoprolol succinate  25 mg Oral Daily   pantoprazole  40 mg Oral Daily   PARoxetine  20 mg Oral Daily      LOS: 3 days   Lonia Blood, MD [facility] Office  [phone] Pager - Text Page per Loretha Stapler  If 7PM-7AM, please contact night-coverage per Amion 11/25/2023, 8:06 AM

## 2023-11-26 DIAGNOSIS — S2242XD Multiple fractures of ribs, left side, subsequent encounter for fracture with routine healing: Secondary | ICD-10-CM | POA: Diagnosis not present

## 2023-11-26 LAB — CBC
HCT: 39.6 % (ref 36.0–46.0)
Hemoglobin: 12.9 g/dL (ref 12.0–15.0)
MCH: 30.5 pg (ref 26.0–34.0)
MCHC: 32.6 g/dL (ref 30.0–36.0)
MCV: 93.6 fL (ref 80.0–100.0)
Platelets: 255 10*3/uL (ref 150–400)
RBC: 4.23 MIL/uL (ref 3.87–5.11)
RDW: 13.9 % (ref 11.5–15.5)
WBC: 8.4 10*3/uL (ref 4.0–10.5)
nRBC: 0 % (ref 0.0–0.2)

## 2023-11-26 LAB — BASIC METABOLIC PANEL
Anion gap: 13 (ref 5–15)
BUN: 20 mg/dL (ref 8–23)
CO2: 19 mmol/L — ABNORMAL LOW (ref 22–32)
Calcium: 9.3 mg/dL (ref 8.9–10.3)
Chloride: 105 mmol/L (ref 98–111)
Creatinine, Ser: 0.67 mg/dL (ref 0.44–1.00)
GFR, Estimated: >60 mL/min (ref >60)
Glucose, Bld: 114 mg/dL — ABNORMAL HIGH (ref 70–99)
Potassium: 4.6 mmol/L (ref 3.5–5.1)
Sodium: 137 mmol/L (ref 135–145)

## 2023-11-26 MED ORDER — MENTHOL 3 MG MT LOZG: 1.0000 | LOZENGE | OROMUCOSAL | Status: DC | PRN

## 2023-11-26 MED ORDER — MAGNESIUM CITRATE PO SOLN: 0.5000 | Freq: Once | ORAL | Status: DC | PRN

## 2023-11-26 MED ORDER — MAGNESIUM CITRATE PO SOLN
0.5000 | Freq: Once | ORAL | Status: DC
  Filled 2023-11-26: qty 296

## 2023-11-26 NOTE — Progress Notes (Signed)
[name]  ZOX:[id number] DOB: [birth year] DOA: 11/21/2023 PCP: Caesar Bookman, NP    Brief Narrative:  89 year old with a history of chronic atrial fibrillation on Pradaxa, CAD, pacemaker placement, HTN, and HLD who presented to the [facility] ER 1/14 after suffering a mechanical fall.  Workup in the ER revealed multiple left-sided rib fractures, a suspected splenic laceration, acute thoracic compression fractures, and a small left hemopneumothorax.  The patient did not require an immediate chest tube.  She was seen by trauma surgery and transferred to [facility] to be followed by the trauma team.  Goals of Care:   Code Status: Do not attempt resuscitation (DNR) PRE-ARREST INTERVENTIONS DESIRED   DVT prophylaxis: enoxaparin (LOVENOX) injection 40 mg Start: 11/23/23 1215 SCDs Start: 11/22/23 0208   Interim Hx: No acute events recorded overnight.  Afebrile.  Vital signs stable.  In good spirits.  Reports some difficulty with ongoing constipation.  States pain is well-controlled.  Is quite delightful.  Assessment & Plan:  Simple mechanical fall No evidence to suggest anything more complicated - PT/OT following -will need to reconsider if patient is an appropriate candidate for ongoing anticoagulation given advanced age and recent propensity for falls  Multiple left-sided rib fractures with small hemopneumothorax Pneumothorax spontaneously resolved on follow-up CXR - hemoglobin holding steady - no indication for chest tube -continue incentive spirometer  Suspected splenic laceration Holding DOAC for foreseeable future - hemoglobin stable thus far -care per Trauma Service -cleared by Surgery to utilize pharmacologic DVT prophylaxis 1/16 -initiated without any deleterious effect thus far  Acute thoracic compression fractures Evaluated by Trauma Service and Neurosurgery -there is a thought that these may in fact be chronic and not acute - nonetheless Neurosurgery has recommended nonsurgical  conservative treatment -continue PT/OT  Chronic atrial fibrillation On Pradaxa at home which is presently on hold - discussed with patient and family that risk-benefit analysis favors holding Pradaxa for now, and possibly permanently -she is instructed to discuss this further with her cardiologist in the outpatient setting but that 14 days of abstinence is an absolute requirement given her possible splenic laceration  HTN Blood pressure currently well-controlled -no indication for strict control -avoid hypotension  HLD Continue pravastatin  Mood disorder Continue paroxetine  Family Communication: Spoke with granddaughter at bedside Disposition: The patient has chosen to pursue SNF rehab placement   Objective: Blood pressure 136/79, pulse 73, temperature 97.7 F (36.5 C), temperature source Oral, resp. rate 17, height 5\' 4"  (1.626 m), weight 77.7 kg, SpO2 96%.  Intake/Output Summary (Last 24 hours) at 11/26/2023 0804 Last data filed at 11/25/2023 1700 Gross per 24 hour  Intake 480 ml  Output 1100 ml  Net -620 ml   Filed Weights   11/21/23 1809 11/25/23 0503  Weight: 75.3 kg 77.7 kg    Examination: General: No acute respiratory distress Lungs: Clear to auscultation B -no wheezing Cardiovascular: Regular rate and rhythm  Abdomen: Nontender, nondistended, soft, bowel sounds positive, no rebound, no ascites Extremities: No significant edema B LE  CBC: Recent Labs  Lab 11/21/23 1923 11/22/23 0339 11/22/23 1342 11/23/23 0937 11/24/23 0643 11/26/23 0448  WBC 17.2* 10.7*  --  11.6* 10.1 8.4  NEUTROABS 14.6* 9.1*  --   --   --   --   HGB 14.0 12.9   < > 14.0 12.8 12.9  HCT 44.9 40.3   < > 43.2 39.6 39.6  MCV 96.8 96.2  --  94.7 95.2 93.6  PLT 250 245  --  225 190 255   < > = values in this interval not displayed.   Basic Metabolic Panel: Recent Labs  Lab 11/22/23 0339 11/24/23 0643 11/26/23 0448  NA 138 136 137  K 4.5 4.2 4.6  CL 106 104 105  CO2 21* 23 19*   GLUCOSE 213* 128* 114*  BUN 19 17 20   CREATININE 0.66 0.67 0.67  CALCIUM 9.5 9.1 9.3  MG 2.3 2.1  --    GFR: Estimated Creatinine Clearance: 42.4 mL/min (by C-G formula based on SCr of 0.67 mg/dL).   Scheduled Meds:  acetaminophen  1,000 mg Oral Q6H   enoxaparin (LOVENOX) injection  40 mg Subcutaneous Q24H   gabapentin  300 mg Oral BID   levothyroxine  88 mcg Oral QAC breakfast   lidocaine  1 patch Transdermal Q24H   lidocaine  1 patch Transdermal Q24H   methocarbamol  750 mg Oral TID   metoprolol succinate  25 mg Oral Daily   pantoprazole  40 mg Oral Daily   PARoxetine  20 mg Oral Daily   polyethylene glycol  17 g Oral BID   senna-docusate  1 tablet Oral BID      LOS: 4 days   Lonia Blood, MD [facility] Office  [phone] Pager - Text Page per Loretha Stapler  If 7PM-7AM, please contact night-coverage per Amion 11/26/2023, 8:04 AM

## 2023-11-26 NOTE — Plan of Care (Signed)

## 2023-11-26 NOTE — Plan of Care (Signed)

## 2023-11-27 ENCOUNTER — Other Ambulatory Visit: Payer: PPO

## 2023-11-27 DIAGNOSIS — S2242XD Multiple fractures of ribs, left side, subsequent encounter for fracture with routine healing: Secondary | ICD-10-CM | POA: Diagnosis not present

## 2023-11-27 NOTE — Progress Notes (Addendum)
[name]  IRJ:[id number] DOB: 1926-04-16 DOA: 11/21/2023 PCP: Caesar Bookman, NP    Brief Narrative:  89 year old with a history of chronic atrial fibrillation on Pradaxa, CAD, pacemaker placement, HTN, and HLD who presented to the [facility] ER 1/14 after suffering a mechanical fall.  Workup in the ER revealed multiple left-sided rib fractures, a suspected splenic laceration, acute thoracic compression fractures, and a small left hemopneumothorax.  The patient did not require an immediate chest tube.  She was seen by trauma surgery and transferred to [facility] to be followed by the trauma team.  Goals of Care:   Code Status: Do not attempt resuscitation (DNR) PRE-ARREST INTERVENTIONS DESIRED   DVT prophylaxis: enoxaparin (LOVENOX) injection 40 mg Start: 11/23/23 1215 SCDs Start: 11/22/23 0208   Interim Hx: Afebrile.  Vital signs stable.  No acute events recorded overnight.  No new complaints.  Resting comfortably in bed.  The patient confirms that she had a large bowel movement this morning.  Assessment & Plan:  Simple mechanical fall No evidence to suggest anything more complicated - PT/OT following - will need to reconsider if patient is an appropriate candidate for ongoing anticoagulation given advanced age and recent propensity for falls  Multiple left-sided rib fractures with small hemopneumothorax Pneumothorax spontaneously resolved on follow-up CXR - hemoglobin holding steady - no indication for chest tube -continue incentive spirometer  Suspected splenic laceration Holding DOAC for foreseeable future - hemoglobin stable thus far -care per Trauma Service -cleared by Surgery to utilize pharmacologic DVT prophylaxis 1/16 - lovenox initiated without any deleterious effect thus far  Acute thoracic compression fractures Evaluated by Trauma Service and Neurosurgery -there is a thought that these may in fact be chronic and not acute - nonetheless Neurosurgery has recommended  nonsurgical conservative treatment -continue PT/OT  Chronic atrial fibrillation On Pradaxa at home which is presently on hold - discussed with patient and family that risk-benefit analysis favors holding Pradaxa for now, and possibly permanently -she is instructed to discuss this further with her cardiologist in the outpatient setting but that 14 days of abstinence is an absolute requirement given her possible splenic laceration  HTN Blood pressure currently well-controlled -no indication for strict control -avoid hypotension  HLD Continue pravastatin  Mood disorder Continue paroxetine  Family Communication: Spoke with daughter at bedside Disposition: The patient has chosen to pursue SNF rehab placement   Objective: Blood pressure (!) 144/66, pulse 79, temperature 98.4 F (36.9 C), temperature source Oral, resp. rate 19, height 5\' 4"  (1.626 m), weight 77.7 kg, SpO2 94%.  Intake/Output Summary (Last 24 hours) at 11/27/2023 0849 Last data filed at 11/26/2023 2119 Gross per 24 hour  Intake 360 ml  Output 600 ml  Net -240 ml   Filed Weights   11/21/23 1809 11/25/23 0503  Weight: 75.3 kg 77.7 kg    Examination: General: No acute respiratory distress Lungs: Clear to auscultation B -no wheezing Cardiovascular: Regular rate and rhythm  Abdomen: Nontender, nondistended, soft, bowel sounds positive, no rebound, no ascites Extremities: No significant edema B LE  CBC: Recent Labs  Lab 11/21/23 1923 11/22/23 0339 11/22/23 1342 11/23/23 0937 11/24/23 0643 11/26/23 0448  WBC 17.2* 10.7*  --  11.6* 10.1 8.4  NEUTROABS 14.6* 9.1*  --   --   --   --   HGB 14.0 12.9   < > 14.0 12.8 12.9  HCT 44.9 40.3   < > 43.2 39.6 39.6  MCV 96.8 96.2  --  94.7 95.2 93.6  PLT 250 245  --  225 190 255   < > = values in this interval not displayed.   Basic Metabolic Panel: Recent Labs  Lab 11/22/23 0339 11/24/23 0643 11/26/23 0448  NA 138 136 137  K 4.5 4.2 4.6  CL 106 104 105  CO2 21*  23 19*  GLUCOSE 213* 128* 114*  BUN 19 17 20   CREATININE 0.66 0.67 0.67  CALCIUM 9.5 9.1 9.3  MG 2.3 2.1  --    GFR: Estimated Creatinine Clearance: 42.4 mL/min (by C-G formula based on SCr of 0.67 mg/dL).   Scheduled Meds:  acetaminophen  1,000 mg Oral Q6H   enoxaparin (LOVENOX) injection  40 mg Subcutaneous Q24H   gabapentin  300 mg Oral BID   levothyroxine  88 mcg Oral QAC breakfast   lidocaine  1 patch Transdermal Q24H   lidocaine  1 patch Transdermal Q24H   magnesium citrate  0.5 Bottle Oral Once   methocarbamol  750 mg Oral TID   metoprolol succinate  25 mg Oral Daily   pantoprazole  40 mg Oral Daily   PARoxetine  20 mg Oral Daily   polyethylene glycol  17 g Oral BID   senna-docusate  1 tablet Oral BID      LOS: 5 days   Lonia Blood, MD [facility] Office  [phone] Pager - Text Page per Loretha Stapler  If 7PM-7AM, please contact night-coverage per Amion 11/27/2023, 8:49 AM

## 2023-11-27 NOTE — Progress Notes (Signed)
Physical Therapy Treatment Patient Details Name: [name] MRN: [id number] DOB: 1926-01-19 Today's Date: 11/27/2023   History of Present Illness 89 y.o. female presents to [facility] 11/21/23 after tripping and falling. Pt w/ small acute L HTPX, T2,T4,T5 superior endplate compression fxs likely chronic, L 5-9 rib fxs, grade 1 splenic laceration.  PMHx: arthritis, a-fib, HTN, pacemaker    PT Comments  Patient is agreeable to PT session. Supportive daughter at the bedside. Patient with increased activity tolerance this session. The patient continues to require assistance for bed mobility. She was able to stand with Mod A using rolling walker. Pre-gait activity performed including taking side steps along edge of bed. At this time continue to recommend rehabilitation < 3 hours/day after this hospital stay.    If plan is discharge home, recommend the following: A lot of help with walking and/or transfers;A lot of help with bathing/dressing/bathroom;Assist for transportation;Help with stairs or ramp for entrance;Assistance with cooking/housework   Can travel by private vehicle     No  Equipment Recommendations   (to be determined at next level of care)    Recommendations for Other Services       Precautions / Restrictions Precautions Precautions: Fall Restrictions Weight Bearing Restrictions Per Provider Order: No     Mobility  Bed Mobility Overal bed mobility: Needs Assistance Bed Mobility: Supine to Sit, Sit to Supine Rolling: Contact guard assist Sidelying to sit: Mod assist     Sit to sidelying: Mod assist General bed mobility comments: cues for technique.    Transfers Overall transfer level: Needs assistance Equipment used: Rolling walker (2 wheels) Transfers: Sit to/from Stand Sit to Stand: Mod assist           General transfer comment: lifting and lowering assistance provided. cues for anterior weight shifting and hand placement    Ambulation/Gait              Pre-gait activities: patient able to take several side steps to the left with rolling walker with Min A for safety. further standing activity limited by fatigue     Stairs             Wheelchair Mobility     Tilt Bed    Modified Rankin (Stroke Patients Only)       Balance Overall balance assessment: Needs assistance Sitting-balance support: Feet supported Sitting balance-Leahy Scale: Good     Standing balance support: Bilateral upper extremity supported Standing balance-Leahy Scale: Poor Standing balance comment: rolling walker used for UE support in standing                            Cognition Arousal: Alert Behavior During Therapy: WFL for tasks assessed/performed Overall Cognitive Status: Within Functional Limits for tasks assessed                                          Exercises      General Comments        Pertinent Vitals/Pain Pain Assessment Pain Assessment: Faces Faces Pain Scale: Hurts a little bit Pain Location: L ribs Pain Descriptors / Indicators: Discomfort, Grimacing Pain Intervention(s): Limited activity within patient's tolerance, Monitored during session, Repositioned    Home Living  Prior Function            PT Goals (current goals can now be found in the care plan section) Acute Rehab PT Goals Patient Stated Goal: to get stronger PT Goal Formulation: With patient Time For Goal Achievement: 12/07/23 Potential to Achieve Goals: Fair Progress towards PT goals: Progressing toward goals    Frequency    Min 1X/week      PT Plan      Co-evaluation              AM-PAC PT "6 Clicks" Mobility   Outcome Measure  Help needed turning from your back to your side while in a flat bed without using bedrails?: A Lot Help needed moving from lying on your back to sitting on the side of a flat bed without using bedrails?: A Lot Help needed moving to and from a  bed to a chair (including a wheelchair)?: A Lot Help needed standing up from a chair using your arms (e.g., wheelchair or bedside chair)?: A Lot Help needed to walk in hospital room?: A Lot Help needed climbing 3-5 steps with a railing? : Total 6 Click Score: 11    End of Session   Activity Tolerance: Patient tolerated treatment well Patient left: in bed;with call bell/phone within reach;with bed alarm set;with family/visitor present   PT Visit Diagnosis: Unsteadiness on feet (R26.81);History of falling (Z91.81);Muscle weakness (generalized) (M62.81)     Time: 4742-5956 PT Time Calculation (min) (ACUTE ONLY): 25 min  Charges:    $Therapeutic Activity: 23-37 mins PT General Charges $$ ACUTE PT VISIT: 1 Visit                     Donna Bernard, PT, MPT    Ina Homes 11/27/2023, 2:41 PM

## 2023-11-27 NOTE — Plan of Care (Signed)
  Problem: Education: Goal: Knowledge of General Education information will improve Description: Including pain rating scale, medication(s)/side effects and non-pharmacologic comfort measures 11/27/2023 0552 by Debbrah Alar, RN Outcome: Not Progressing 11/26/2023 2121 by Debbrah Alar, RN Outcome: Not Progressing   Problem: Health Behavior/Discharge Planning: Goal: Ability to manage health-related needs will improve 11/27/2023 0552 by Debbrah Alar, RN Outcome: Not Progressing 11/26/2023 2121 by Debbrah Alar, RN Outcome: Not Progressing   Problem: Clinical Measurements: Goal: Ability to maintain clinical measurements within normal limits will improve 11/27/2023 0552 by Debbrah Alar, RN Outcome: Not Progressing 11/26/2023 2121 by Debbrah Alar, RN Outcome: Not Progressing Goal: Will remain free from infection 11/27/2023 0552 by Debbrah Alar, RN Outcome: Not Progressing 11/26/2023 2121 by Debbrah Alar, RN Outcome: Not Progressing Goal: Diagnostic test results will improve 11/27/2023 0552 by Debbrah Alar, RN Outcome: Not Progressing 11/26/2023 2121 by Velia Meyer D, RN Outcome: Not Progressing Goal: Respiratory complications will improve 11/27/2023 0552 by Debbrah Alar, RN Outcome: Not Progressing 11/26/2023 2121 by Velia Meyer D, RN Outcome: Not Progressing Goal: Cardiovascular complication will be avoided 11/27/2023 0552 by Debbrah Alar, RN Outcome: Not Progressing 11/26/2023 2121 by Debbrah Alar, RN Outcome: Not Progressing   Problem: Activity: Goal: Risk for activity intolerance will decrease 11/27/2023 0552 by Debbrah Alar, RN Outcome: Not Progressing 11/26/2023 2121 by Debbrah Alar, RN Outcome: Not Progressing   Problem: Nutrition: Goal: Adequate nutrition will be maintained 11/27/2023 0552 by Debbrah Alar, RN Outcome: Not Progressing 11/26/2023 2121 by Velia Meyer D,  RN Outcome: Not Progressing   Problem: Coping: Goal: Level of anxiety will decrease 11/27/2023 0552 by Debbrah Alar, RN Outcome: Not Progressing 11/26/2023 2121 by Debbrah Alar, RN Outcome: Not Progressing   Problem: Elimination: Goal: Will not experience complications related to bowel motility 11/27/2023 0552 by Debbrah Alar, RN Outcome: Not Progressing 11/26/2023 2121 by Debbrah Alar, RN Outcome: Not Progressing Goal: Will not experience complications related to urinary retention 11/27/2023 0552 by Debbrah Alar, RN Outcome: Not Progressing 11/26/2023 2121 by Debbrah Alar, RN Outcome: Not Progressing   Problem: Pain Managment: Goal: General experience of comfort will improve and/or be controlled 11/27/2023 0552 by Debbrah Alar, RN Outcome: Not Progressing 11/26/2023 2121 by Debbrah Alar, RN Outcome: Not Progressing   Problem: Safety: Goal: Ability to remain free from injury will improve 11/27/2023 0552 by Debbrah Alar, RN Outcome: Not Progressing 11/26/2023 2121 by Velia Meyer D, RN Outcome: Not Progressing   Problem: Skin Integrity: Goal: Risk for impaired skin integrity will decrease 11/27/2023 0552 by Debbrah Alar, RN Outcome: Not Progressing 11/26/2023 2121 by Debbrah Alar, RN Outcome: Not Progressing

## 2023-11-27 NOTE — Care Management Important Message (Signed)
Important Message  Patient Details  Name: [name] MRN: [id number] Date of Birth: 08-Mar-1926   Important Message Given:  Yes - Medicare IM     Dorena Bodo 11/27/2023, 3:01 PM

## 2023-11-27 NOTE — TOC Progression Note (Addendum)
Transition of Care [facility]) - Progression Note    Patient Details  Name: [name] MRN: [id number] Date of Birth: 12/03/1925  [facility]) CM/SW Contact  Lorri Frederick, LCSW Phone Number: 11/27/2023, 10:01 AM  Clinical Narrative:   CSW informed by RN that family is now wanting to pursue SNF.  CSW LM with daughter [name] regarding bed offers.    Auth request submitted to [facility].    1030: CSW spoke with pt granddaughter Stacy in room, discussed bed offers, she would like response from [facility].  1100: [facility] is full, Stacy updated, she will accept offer at [facility].  CSW confirmed with [employer] [facility] [employer].  HTA updated with facility choice.    Expected Discharge Plan:  (TBD) Barriers to Discharge: Continued Medical Work up, Other (must enter comment) (family considering SNF vs HH)  Expected Discharge Plan and Services In-house Referral: Clinical Social Work   Post Acute Care Choice:  (TBD-family attempting to put plan in place for pt to DC home) Living arrangements for the past 2 months: Single Family Home                                       Social Determinants of Health (SDOH) Interventions SDOH Screenings   Food Insecurity: No Food Insecurity (11/22/2023)  Housing: Low Risk  (11/22/2023)  Transportation Needs: No Transportation Needs (11/22/2023)  Utilities: Not At Risk (11/22/2023)  Depression (PHQ2-9): Low Risk  (08/16/2023)  Social Connections: Moderately Isolated (11/22/2023)  Tobacco Use: Low Risk  (11/22/2023)    Readmission Risk Interventions     No data to display

## 2023-11-28 DIAGNOSIS — M6281 Muscle weakness (generalized): Secondary | ICD-10-CM | POA: Diagnosis not present

## 2023-11-28 DIAGNOSIS — Z9181 History of falling: Secondary | ICD-10-CM | POA: Diagnosis not present

## 2023-11-28 DIAGNOSIS — R2689 Other abnormalities of gait and mobility: Secondary | ICD-10-CM | POA: Diagnosis not present

## 2023-11-28 DIAGNOSIS — S2242XD Multiple fractures of ribs, left side, subsequent encounter for fracture with routine healing: Secondary | ICD-10-CM | POA: Diagnosis not present

## 2023-11-28 DIAGNOSIS — I480 Paroxysmal atrial fibrillation: Secondary | ICD-10-CM | POA: Diagnosis not present

## 2023-11-28 MED ORDER — LIDOCAINE 5 % EX PTCH: 1.0000 | MEDICATED_PATCH | CUTANEOUS | Status: DC

## 2023-11-28 MED ORDER — ACETAMINOPHEN 325 MG PO TABS: 650.0000 mg | ORAL_TABLET | ORAL | Status: DC | PRN

## 2023-11-28 MED ORDER — SENNOSIDES-DOCUSATE SODIUM 8.6-50 MG PO TABS: 1.0000 | ORAL_TABLET | Freq: Two times a day (BID) | ORAL | Status: DC

## 2023-11-28 MED ORDER — ACETAMINOPHEN 325 MG PO TABS
650.0000 mg | ORAL_TABLET | ORAL | Status: DC | PRN
  Administered 2023-11-29: 650 mg via ORAL
  Filled 2023-11-28: qty 2

## 2023-11-28 MED ORDER — BISACODYL 10 MG RE SUPP: 10.0000 mg | Freq: Every day | RECTAL | Status: DC | PRN

## 2023-11-28 MED ORDER — OXYCODONE HCL 5 MG PO TABS: 2.5000 mg | ORAL_TABLET | ORAL | 0 refills | Status: DC | PRN

## 2023-11-28 MED ORDER — GABAPENTIN 300 MG PO CAPS: 300.0000 mg | ORAL_CAPSULE | Freq: Two times a day (BID) | ORAL | Status: DC

## 2023-11-28 MED ORDER — POLYETHYLENE GLYCOL 3350 17 G PO PACK: 17.0000 g | PACK | Freq: Two times a day (BID) | ORAL | Status: DC

## 2023-11-28 MED ORDER — METHOCARBAMOL 750 MG PO TABS: 750.0000 mg | ORAL_TABLET | Freq: Three times a day (TID) | ORAL | Status: DC | PRN

## 2023-11-28 NOTE — Plan of Care (Signed)
  Problem: Health Behavior/Discharge Planning: Goal: Ability to manage health-related needs will improve Outcome: Progressing   Problem: Pain Managment: Goal: General experience of comfort will improve and/or be controlled Outcome: Progressing   Problem: Safety: Goal: Ability to remain free from injury will improve Outcome: Progressing   Problem: Skin Integrity: Goal: Risk for impaired skin integrity will decrease Outcome: Progressing

## 2023-11-28 NOTE — TOC Progression Note (Addendum)
Transition of Care [facility]) - Progression Note    Patient Details  Name: MARTIN UMPIERRE MRN: [id number] Date of Birth: [birth year]  [facility]) CM/SW Contact  Lorri Frederick, LCSW Phone Number: 11/28/2023, 8:45 AM  Clinical Narrative:   SNF auth approved, 7 days: [id number], PTAR also approved: [id number].  Message with Nikki/[facility]--unsure if they will have available bed today, will know after AM meeting.    1200: No bed at [facility] available today, anticipate bed tomorrow, per Early.  Expected Discharge Plan:  (TBD) Barriers to Discharge: Continued Medical Work up, Other (must enter comment) (family considering SNF vs HH)  Expected Discharge Plan and Services In-house Referral: Clinical Social Work   Post Acute Care Choice:  (TBD-family attempting to put plan in place for pt to DC home) Living arrangements for the past 2 months: Single Family Home                                       Social Determinants of Health (SDOH) Interventions SDOH Screenings   Food Insecurity: No Food Insecurity (11/22/2023)  Housing: Low Risk  (11/22/2023)  Transportation Needs: No Transportation Needs (11/22/2023)  Utilities: Not At Risk (11/22/2023)  Depression (PHQ2-9): Low Risk  (08/16/2023)  Social Connections: Moderately Isolated (11/22/2023)  Tobacco Use: Low Risk  (11/22/2023)    Readmission Risk Interventions     No data to display

## 2023-11-28 NOTE — Discharge Summary (Addendum)
DISCHARGE SUMMARY  [name]  MR#: [id number]  DOB:1926-07-03  Date of Admission: 11/21/2023 Date of Discharge: 11/28/2023  Attending Physician:Ashunti Schofield Silvestre Gunner, MD  Patient's WUJ:[id number], Donalee Citrin, NP  Disposition: D/C to SNF for rehab stay   Follow-up Appts:  Follow-up Information     Ngetich, Dinah C, NP Follow up.   Specialty: Family Medicine Why: See your PCP 1 week after you are discharged from your rehab facility. Contact information: [address] [place] [address] [phone]         Sandy Salaam, MD Follow up in 1 week(s).   Specialty: Cardiology Why: Set up an appointment to see your Cardiologist within a week to discuss whether or not you should resume Pradaxa. Contact information: 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 Workup in the ER revealed multiple left-sided rib fractures, a suspected splenic laceration, acute thoracic compression fractures, and a small left hemopneumothorax. The patient did not require an immediate chest tube. She was seen by trauma surgery and transferred to [facility] to be followed by the trauma team.   Hospital Course:  Simple mechanical fall No evidence to suggest anything more complicated - PT/OT following - will need to reconsider if patient is an appropriate candidate for ongoing anticoagulation  after 14 days of abstinence from Pradaxa given advanced age and recent propensity for falls   Multiple left-sided rib fractures with small hemopneumothorax Pneumothorax spontaneously resolved on follow-up CXR - hemoglobin holding steady - no indication for chest tube -continue incentive spirometer   Suspected splenic laceration Holding DOAC for a minimum of 14 days but potentially permanently as discussed above - hemoglobin stable thus far -care per Trauma Service -cleared by Surgery to utilize pharmacologic DVT prophylaxis 1/16 - lovenox initiated without any deleterious effect   Acute thoracic compression fractures Evaluated by Trauma Service and Neurosurgery - it was thought these may in fact be chronic and not acute - nonetheless Neurosurgery has recommended nonsurgical conservative treatment -continue PT/OT   Chronic atrial fibrillation On Pradaxa at home which is presently on hold - discussed with patient and multiple family members on multiple occasions that risk-benefit analysis favors holding Pradaxa for now, and possibly permanently - she is instructed to discuss this further with her Cardiologist in the outpatient setting, but informed that 14 days of abstinence is an absolute requirement given her possible splenic laceration   HTN no indication for strict control - avoid hypotension   HLD Continue pravastatin   Mood disorder Continue paroxetine  Allergies as of 11/28/2023       Reactions   Codeine Nausea Only        Medication List     STOP taking these medications    cholecalciferol 25 MCG (1000 UNIT) tablet Commonly known as: VITAMIN D3   diclofenac Sodium 1 % Gel Commonly known as: VOLTAREN   furosemide 40 MG tablet Commonly known as: LASIX  gabapentin 600 MG tablet Commonly known as: NEURONTIN Replaced by: gabapentin 300 MG capsule   potassium chloride 10 MEQ CR capsule Commonly known as: MICRO-K   Pradaxa 75 MG Caps capsule Generic drug:  dabigatran   UNABLE TO FIND       TAKE these medications    ABC PLUS PO Take 1 tablet by mouth daily.   acetaminophen 325 MG tablet Commonly known as: TYLENOL Take 2 tablets (650 mg total) by mouth every 4 (four) hours as needed for mild pain (pain score 1-3), fever or headache. What changed:  medication strength how much to take when to take this reasons to take this   Align 4 MG Caps Take 1 capsule (4 mg total) by mouth daily.   bisacodyl 10 MG suppository Commonly known as: DULCOLAX Place 1 suppository (10 mg total) rectally daily as needed for moderate constipation.   Calcium Carb-Cholecalciferol 600-800 MG-UNIT Tabs Take 1 tablet by mouth every morning.   gabapentin 300 MG capsule Commonly known as: NEURONTIN Take 1 capsule (300 mg total) by mouth 2 (two) times daily. Replaces: gabapentin 600 MG tablet   lidocaine 5 % Commonly known as: LIDODERM Place 1 patch onto the skin daily. Remove & Discard patch within 12 hours or as directed by MD   lidocaine 5 % Commonly known as: LIDODERM Place 1 patch onto the skin daily. Remove & Discard patch within 12 hours or as directed by MD Start taking on: November 29, 2023   methocarbamol 750 MG tablet Commonly known as: ROBAXIN Take 1 tablet (750 mg total) by mouth every 8 (eight) hours as needed for muscle spasms.   metoprolol succinate 25 MG 24 hr tablet Commonly known as: TOPROL-XL Take 25 mg by mouth daily.   Omega-3 1000 MG Caps Take 1 capsule by mouth daily.   oxyCODONE 5 MG immediate release tablet Commonly known as: Oxy IR/ROXICODONE Take 0.5-1 tablets (2.5-5 mg total) by mouth every 4 (four) hours as needed for severe pain (pain score 7-10).   pantoprazole 40 MG tablet Commonly known as: PROTONIX TAKE 1 TABLET BY MOUTH ONCE DAILY   PARoxetine 20 MG tablet Commonly known as: PAXIL TAKE 1 TABLET BY MOUTH DAILY.   polyethylene glycol 17 g packet Commonly known as: MIRALAX / GLYCOLAX Take 17 g by  mouth 2 (two) times daily.   pravastatin 10 MG tablet Commonly known as: PRAVACHOL TAKE 1 TABLET BY MOUTH DAILY.   senna-docusate 8.6-50 MG tablet Commonly known as: Senokot-S Take 1 tablet by mouth 2 (two) times daily.   Synthroid 88 MCG tablet Generic drug: levothyroxine TAKE 1 TABLET (88 MCG TOTAL) BY MOUTH DAILY BEFORE BREAKFAST ON EMPTY STOMACH   vitamin C 1000 MG tablet Take 500 mg by mouth daily. 1/2 tablet in the morning and 1/2 tablet in the evening.        Day of Discharge BP (!) 155/80 (BP Location: Left Arm)   Pulse 75   Temp 98 F (36.7 C) (Oral)   Resp 19   Ht 5\' 4"  (1.626 m)   Wt 77.7 kg   SpO2 96%   BMI 29.40 kg/m   Physical Exam: General: No acute respiratory distress Lungs: Clear to auscultation bilaterally without wheeze Cardiovascular: Regular rate and rhythm without murmur  Abdomen: Nontender, nondistended, soft, bowel sounds positive, no rebound Extremities: No significant cyanosis, clubbing, or edema bilateral lower extremities  Basic Metabolic Panel: Recent Labs  Lab 11/21/23 1923 11/22/23 0339 11/24/23 0643 11/26/23 0448  NA 139 138  136 137  K 5.2* 4.5 4.2 4.6  CL 108 106 104 105  CO2 21* 21* 23 19*  GLUCOSE 181* 213* 128* 114*  BUN 17 19 17 20   CREATININE 0.70 0.66 0.67 0.67  CALCIUM 9.0 9.5 9.1 9.3  MG  --  2.3 2.1  --     CBC: Recent Labs  Lab 11/21/23 1923 11/22/23 0339 11/22/23 1342 11/23/23 0937 11/24/23 0643 11/26/23 0448  WBC 17.2* 10.7*  --  11.6* 10.1 8.4  NEUTROABS 14.6* 9.1*  --   --   --   --   HGB 14.0 12.9 12.3 14.0 12.8 12.9  HCT 44.9 40.3 37.7 43.2 39.6 39.6  MCV 96.8 96.2  --  94.7 95.2 93.6  PLT 250 245  --  225 190 255    Time spent in discharge (includes decision making & examination of pt): 35 minutes  11/28/2023, 4:41 PM   Lonia Blood, MD [facility] Office  [phone]

## 2023-11-28 NOTE — Progress Notes (Signed)
Occupational Therapy Treatment Patient Details Name: [name] MRN: [id number] DOB: August 27, 1926 Today's Date: 11/28/2023   History of present illness 89 y.o. female presents to [facility] 11/21/23 after tripping and falling. Pt w/ small acute L HTPX, T2,T4,T5 superior endplate compression fxs likely chronic, L 5-9 rib fxs, grade 1 splenic laceration.  PMHx: arthritis, a-fib, HTN, pacemaker   OT comments  Patient supine in bed and agreeable to OT.  Reports pain in L shoulder, but agreeable to participate in OT.  Requires mod assist for bed mobility, min assist for transfers using RW to recliner given multiple trials.  Total assist for LB dressing and min assist for UB dressing.  Continue to recommend <3hrs/day inpatient setting at dc.  Will follow acutely.       If plan is discharge home, recommend the following:  A lot of help with walking and/or transfers;A lot of help with bathing/dressing/bathroom;Assistance with cooking/housework;Assist for transportation;Help with stairs or ramp for entrance   Equipment Recommendations  Other (comment) (defer)    Recommendations for Other Services      Precautions / Restrictions Precautions Precautions: Fall Precaution Comments: back for comfort Restrictions Weight Bearing Restrictions Per Provider Order: No       Mobility Bed Mobility Overal bed mobility: Needs Assistance Bed Mobility: Rolling, Sidelying to Sit Rolling: Contact guard assist Sidelying to sit: Mod assist       General bed mobility comments: cueing for technique, mod assist for trunk elevation and scooting hips forward    Transfers Overall transfer level: Needs assistance Equipment used: Rolling walker (2 wheels) Transfers: Sit to/from Stand, Bed to chair/wheelchair/BSC Sit to Stand: Min assist     Step pivot transfers: Min assist     General transfer comment: min assist to power up from EOB fully on 2nd trial, stand step to recliner with min assist     Balance  Overall balance assessment: Needs assistance Sitting-balance support: No upper extremity supported, Feet supported Sitting balance-Leahy Scale: Fair Sitting balance - Comments: min guard for safety   Standing balance support: Bilateral upper extremity supported, During functional activity Standing balance-Leahy Scale: Poor Standing balance comment: relies on RW and external support                           ADL either performed or assessed with clinical judgement   ADL Overall ADL's : Needs assistance/impaired     Grooming: Set up;Sitting           Upper Body Dressing : Minimal assistance;Sitting   Lower Body Dressing: Total assistance;Sit to/from stand   Toilet Transfer: Minimal assistance;Rolling walker (2 wheels) Toilet Transfer Details (indicate cue type and reason): to recliner, stand step with RW         Functional mobility during ADLs: Moderate assistance;Rolling walker (2 wheels);Cueing for sequencing      Extremity/Trunk Assessment              Vision       Perception     Praxis      Cognition Arousal: Alert Behavior During Therapy: WFL for tasks assessed/performed Overall Cognitive Status: Within Functional Limits for tasks assessed                                 General Comments: pt with slow processing but appears [facility]        Exercises  Shoulder Instructions       General Comments pt on RA    Pertinent Vitals/ Pain       Pain Assessment Pain Assessment: Faces Faces Pain Scale: Hurts a little bit Pain Location: L shoulder Pain Descriptors / Indicators: Discomfort, Grimacing Pain Intervention(s): Limited activity within patient's tolerance, Monitored during session, Repositioned  Home Living                                          Prior Functioning/Environment              Frequency  Min 1X/week        Progress Toward Goals  OT Goals(current goals can now be found in  the care plan section)  Progress towards OT goals: Progressing toward goals  Acute Rehab OT Goals Patient Stated Goal: get better OT Goal Formulation: With patient Time For Goal Achievement: 12/07/23 Potential to Achieve Goals: Fair  Plan      Co-evaluation                 AM-PAC OT "6 Clicks" Daily Activity     Outcome Measure   Help from another person eating meals?: A Little Help from another person taking care of personal grooming?: A Little Help from another person toileting, which includes using toliet, bedpan, or urinal?: Total Help from another person bathing (including washing, rinsing, drying)?: A Lot Help from another person to put on and taking off regular upper body clothing?: A Little Help from another person to put on and taking off regular lower body clothing?: Total 6 Click Score: 13    End of Session Equipment Utilized During Treatment: Gait belt;Rolling walker (2 wheels)  OT Visit Diagnosis: Other abnormalities of gait and mobility (R26.89);Muscle weakness (generalized) (M62.81);Pain;History of falling (Z91.81) Pain - Right/Left: Left Pain - part of body: Shoulder   Activity Tolerance Patient tolerated treatment well   Patient Left in chair;with call bell/phone within reach;with chair alarm set   Nurse Communication Mobility status (fatigues quickly and recommend only 30-45 minutes in chair)        Time: 0960-4540 OT Time Calculation (min): 22 min  Charges: OT General Charges $OT Visit: 1 Visit OT Treatments $Self Care/Home Management : 8-22 mins  Barry Brunner, OT Acute Rehabilitation Services Office [phone]   Chancy Milroy 11/28/2023, 9:35 AM

## 2023-11-29 DIAGNOSIS — Z7189 Other specified counseling: Secondary | ICD-10-CM | POA: Diagnosis not present

## 2023-11-29 DIAGNOSIS — L231 Allergic contact dermatitis due to adhesives: Secondary | ICD-10-CM | POA: Diagnosis not present

## 2023-11-29 DIAGNOSIS — S36039A Unspecified laceration of spleen, initial encounter: Secondary | ICD-10-CM | POA: Diagnosis not present

## 2023-11-29 DIAGNOSIS — R2681 Unsteadiness on feet: Secondary | ICD-10-CM | POA: Diagnosis not present

## 2023-11-29 DIAGNOSIS — I480 Paroxysmal atrial fibrillation: Secondary | ICD-10-CM | POA: Diagnosis not present

## 2023-11-29 DIAGNOSIS — S2242XD Multiple fractures of ribs, left side, subsequent encounter for fracture with routine healing: Secondary | ICD-10-CM | POA: Diagnosis not present

## 2023-11-29 DIAGNOSIS — F39 Unspecified mood [affective] disorder: Secondary | ICD-10-CM | POA: Diagnosis not present

## 2023-11-29 DIAGNOSIS — E039 Hypothyroidism, unspecified: Secondary | ICD-10-CM | POA: Diagnosis not present

## 2023-11-29 DIAGNOSIS — Z950 Presence of cardiac pacemaker: Secondary | ICD-10-CM | POA: Diagnosis not present

## 2023-11-29 DIAGNOSIS — E785 Hyperlipidemia, unspecified: Secondary | ICD-10-CM | POA: Diagnosis not present

## 2023-11-29 DIAGNOSIS — R2689 Other abnormalities of gait and mobility: Secondary | ICD-10-CM | POA: Diagnosis not present

## 2023-11-29 DIAGNOSIS — I2510 Atherosclerotic heart disease of native coronary artery without angina pectoris: Secondary | ICD-10-CM | POA: Diagnosis not present

## 2023-11-29 DIAGNOSIS — R1310 Dysphagia, unspecified: Secondary | ICD-10-CM | POA: Diagnosis not present

## 2023-11-29 DIAGNOSIS — F32A Depression, unspecified: Secondary | ICD-10-CM | POA: Diagnosis not present

## 2023-11-29 DIAGNOSIS — I10 Essential (primary) hypertension: Secondary | ICD-10-CM | POA: Diagnosis not present

## 2023-11-29 DIAGNOSIS — I495 Sick sinus syndrome: Secondary | ICD-10-CM | POA: Diagnosis not present

## 2023-11-29 DIAGNOSIS — Z9181 History of falling: Secondary | ICD-10-CM | POA: Diagnosis not present

## 2023-11-29 DIAGNOSIS — M6281 Muscle weakness (generalized): Secondary | ICD-10-CM | POA: Diagnosis not present

## 2023-11-29 MED ORDER — OXYCODONE HCL 5 MG PO TABS: 5.0000 mg | ORAL_TABLET | ORAL | 0 refills | Status: DC | PRN

## 2023-11-29 NOTE — Progress Notes (Signed)
Report given to [employer]. All questions explained. Patient being brought down to daughter providing ride by NT at this time.

## 2023-11-29 NOTE — Progress Notes (Signed)
Physical Therapy Treatment Patient Details Name: [name] MRN: [id number] DOB: September 07, 1926 Today's Date: 11/29/2023   History of Present Illness 89 y.o. female presents to [facility] 11/21/23 after tripping and falling. Pt w/ small acute L HTPX, T2,T4,T5 superior endplate compression fxs likely chronic, L 5-9 rib fxs, grade 1 splenic laceration.  PMHx: arthritis, a-fib, HTN, pacemaker    PT Comments  Pt seen for PT tx with pt agreeable. Pt demonstrates improving strength & activity tolerance on this date. Pt is able to complete bed mobility with min assist with use of hospital bed features, STS from elevated surface with RW & min assist. Pt ambulates in room, bathroom with RW & min assist with decreased gait speed. Pt notes increasing "side" pain during session. Continue to recommend ongoing PT services to address strengthening, balance, endurance, to increase independence with mobility & decrease caregiver burden & fall risk.    If plan is discharge home, recommend the following: A little help with walking and/or transfers;A little help with bathing/dressing/bathroom;Assistance with feeding;Assistance with cooking/housework;Supervision due to cognitive status;Direct supervision/assist for financial management;Help with stairs or ramp for entrance;Direct supervision/assist for medications management   Can travel by private vehicle     Yes  Equipment Recommendations  Other (comment) (defer to next venue)    Recommendations for Other Services       Precautions / Restrictions Precautions Precautions: Fall Precaution Comments: back for comfort Restrictions Weight Bearing Restrictions Per Provider Order: No     Mobility  Bed Mobility   Bed Mobility: Supine to Sit   Sidelying to sit: HOB elevated, Used rails, Min assist       General bed mobility comments: min assist to move BLE to EOB, upright trunk but pt with good effort given & use of HOB elevated & bed rails to exit R side of bed     Transfers Overall transfer level: Needs assistance Equipment used: Rolling walker (2 wheels) Transfers: Sit to/from Stand Sit to Stand: Min assist, From elevated surface           General transfer comment: STS from elevated EOB & BSC over toilet with cuing re: hand placement, assistance to power up to standing    Ambulation/Gait Ambulation/Gait assistance: Min assist Gait Distance (Feet): 10 Feet (+ 5 ft + 10 ft) Assistive device: Rolling walker (2 wheels) Gait Pattern/deviations: Decreased step length - right, Decreased step length - left, Decreased stride length, Trunk flexed Gait velocity: decreased     General Gait Details: bed>toilet>sink>recliner   Stairs             Wheelchair Mobility     Tilt Bed    Modified Rankin (Stroke Patients Only)       Balance Overall balance assessment: Needs assistance Sitting-balance support: No upper extremity supported, Feet supported Sitting balance-Leahy Scale: Fair Sitting balance - Comments: supervision sitting on toilet   Standing balance support: Bilateral upper extremity supported, During functional activity Standing balance-Leahy Scale: Fair Standing balance comment: CGA static standing at sink for hand hygiene                            Cognition Arousal: Alert Behavior During Therapy: WFL for tasks assessed/performed Overall Cognitive Status: Within Functional Limits for tasks assessed                         Following Commands: Follows one step commands consistently, Follows one step commands  with increased time                Exercises      General Comments General comments (skin integrity, edema, etc.): Pt with continent BM, requires assistance for peri hygiene.      Pertinent Vitals/Pain Pain Assessment Pain Assessment: Faces Faces Pain Scale: Hurts even more Pain Location: "my side" Pain Descriptors / Indicators: Discomfort, Grimacing Pain Intervention(s):  Monitored during session, Limited activity within patient's tolerance, Patient requesting pain meds-RN notified, Repositioned    Home Living                          Prior Function            PT Goals (current goals can now be found in the care plan section) Acute Rehab PT Goals Patient Stated Goal: to get stronger, decreased pain PT Goal Formulation: With patient Time For Goal Achievement: 12/07/23 Potential to Achieve Goals: Good Progress towards PT goals: Progressing toward goals    Frequency    Min 1X/week      PT Plan      Co-evaluation              AM-PAC PT "6 Clicks" Mobility   Outcome Measure  Help needed turning from your back to your side while in a flat bed without using bedrails?: A Little Help needed moving from lying on your back to sitting on the side of a flat bed without using bedrails?: A Lot Help needed moving to and from a bed to a chair (including a wheelchair)?: A Little Help needed standing up from a chair using your arms (e.g., wheelchair or bedside chair)?: A Little Help needed to walk in hospital room?: A Little Help needed climbing 3-5 steps with a railing? : Total 6 Click Score: 15    End of Session Equipment Utilized During Treatment: Gait belt Activity Tolerance: Patient limited by pain Patient left: in chair;with chair alarm set;with call bell/phone within reach Nurse Communication: Mobility status;Patient requests pain meds (c/o pain & request for pain meds, request for cream on bottom) PT Visit Diagnosis: Unsteadiness on feet (R26.81);History of falling (Z91.81);Muscle weakness (generalized) (M62.81);Difficulty in walking, not elsewhere classified (R26.2)     Time: 1610-9604 PT Time Calculation (min) (ACUTE ONLY): 17 min  Charges:    $Therapeutic Activity: 8-22 mins PT General Charges $$ ACUTE PT VISIT: 1 Visit                     Aleda Grana, PT, DPT 11/29/23, 10:13 AM   Sandi Mariscal 11/29/2023,  10:12 AM

## 2023-11-29 NOTE — TOC Progression Note (Signed)
Transition of Care [facility]) - Progression Note    Patient Details  Name: [name] MRN: [id number] Date of Birth: 12/05/1925  [facility] CM/SW Contact  Lorri Frederick, LCSW Phone Number: 11/29/2023, 11:54 AM  Clinical Narrative:   CSW confirmed with Nikki/[facility] that they can receive pt today.  CSW spoke with daughter [name], who is going to [facility] for paperwork.   1150: Unable to reach [facility], pt just seen by PT and does not need ambulance transport.  CSW spoke with Arsenio Katz, who can transport, will plan to be at [facility] around 1pm.  RN informed.     Expected Discharge Plan:  (TBD) Barriers to Discharge: Continued Medical Work up, Other (must enter comment) (family considering SNF vs HH)  Expected Discharge Plan and Services In-house Referral: Clinical Social Work   Post Acute Care Choice:  (TBD-family attempting to put plan in place for pt to DC home) Living arrangements for the past 2 months: Single Family Home Expected Discharge Date: 11/29/23                                     Social Determinants of Health (SDOH) Interventions SDOH Screenings   Food Insecurity: No Food Insecurity (11/22/2023)  Housing: Low Risk  (11/22/2023)  Transportation Needs: No Transportation Needs (11/22/2023)  Utilities: Not At Risk (11/22/2023)  Depression (PHQ2-9): Low Risk  (08/16/2023)  Social Connections: Moderately Isolated (11/22/2023)  Tobacco Use: Low Risk  (11/22/2023)    Readmission Risk Interventions     No data to display

## 2023-11-29 NOTE — TOC Transition Note (Signed)
Transition of Care [facility]) - Discharge Note   Patient Details  Name: [name] MRN: [id number] Date of Birth: 1926/05/08  [facility] of [facility]) CM/SW Contact:  Lorri Frederick, LCSW Phone Number: 11/29/2023, 11:57 AM   Clinical Narrative:   Pt discharging to [facility], room 505.  RN call report to [phone].  Pt granddaughter [name] will transport, will need pt brought down to main north tower entrance with assistance getting into the vehicle.      Final next level of care: Skilled Nursing Facility Barriers to Discharge: Barriers Resolved   Patient Goals and CMS Choice   CMS Medicare.gov Compare Post Acute Care list provided to:: Patient Represenative (must comment) (daughters [name] and [name]) Choice offered to / list presented to : Adult Children (daughters [name] and [name])      Discharge Placement              Patient chooses bed at: [facility] Patient to be transferred to facility by: granddaughter [name] Name of family member notified: daughter [name], granddaughter [name] Patient and family notified of of transfer: 11/29/23  Discharge Plan and Services Additional resources added to the After Visit Summary for   In-house Referral: Clinical Social Work   Post Acute Care Choice:  (TBD-family attempting to put plan in place for pt to DC home)                               Social Drivers of Health (SDOH) Interventions SDOH Screenings   Food Insecurity: No Food Insecurity (11/22/2023)  Housing: Low Risk  (11/22/2023)  Transportation Needs: No Transportation Needs (11/22/2023)  Utilities: Not At Risk (11/22/2023)  Depression (PHQ2-9): Low Risk  (08/16/2023)  Social Connections: Moderately Isolated (11/22/2023)  Tobacco Use: Low Risk  (11/22/2023)     Readmission Risk Interventions     No data to display

## 2023-11-29 NOTE — Progress Notes (Addendum)
  PROGRESS NOTE  Patient seen today.  Discussed with TOC, patient is to be discharged to [facility] today.  No new change, patient stable for discharge.  See Dr. Vassie Moment discharge summary.   Noralee Stain, DO [facility] 11/29/2023, 11:37 AM  Available via Epic secure chat 7am-7pm After these hours, please refer to coverage provider listed on [email or web address]

## 2023-11-29 NOTE — Plan of Care (Signed)
  Problem: Education: Goal: Knowledge of General Education information will improve Description: Including pain rating scale, medication(s)/side effects and non-pharmacologic comfort measures Outcome: Progressing   Problem: Elimination: Goal: Will not experience complications related to bowel motility Outcome: Progressing   Problem: Pain Managment: Goal: General experience of comfort will improve and/or be controlled Outcome: Progressing   Problem: Safety: Goal: Ability to remain free from injury will improve Outcome: Progressing

## 2023-11-29 NOTE — Progress Notes (Signed)
Attempted to call report to [employer]. No response.

## 2023-11-30 ENCOUNTER — Ambulatory Visit: Payer: PPO | Admitting: Family

## 2023-11-30 DIAGNOSIS — S2242XD Multiple fractures of ribs, left side, subsequent encounter for fracture with routine healing: Secondary | ICD-10-CM | POA: Diagnosis not present

## 2023-11-30 DIAGNOSIS — I480 Paroxysmal atrial fibrillation: Secondary | ICD-10-CM | POA: Diagnosis not present

## 2023-11-30 DIAGNOSIS — E039 Hypothyroidism, unspecified: Secondary | ICD-10-CM | POA: Diagnosis not present

## 2023-11-30 DIAGNOSIS — S36039A Unspecified laceration of spleen, initial encounter: Secondary | ICD-10-CM | POA: Diagnosis not present

## 2023-11-30 DIAGNOSIS — F32A Depression, unspecified: Secondary | ICD-10-CM | POA: Diagnosis not present

## 2023-11-30 DIAGNOSIS — Z9181 History of falling: Secondary | ICD-10-CM | POA: Diagnosis not present

## 2023-11-30 DIAGNOSIS — I10 Essential (primary) hypertension: Secondary | ICD-10-CM | POA: Diagnosis not present

## 2023-12-01 ENCOUNTER — Ambulatory Visit: Payer: PPO | Admitting: Family

## 2023-12-05 DIAGNOSIS — Z9181 History of falling: Secondary | ICD-10-CM | POA: Diagnosis not present

## 2023-12-05 DIAGNOSIS — Z7189 Other specified counseling: Secondary | ICD-10-CM | POA: Diagnosis not present

## 2023-12-05 DIAGNOSIS — S2242XD Multiple fractures of ribs, left side, subsequent encounter for fracture with routine healing: Secondary | ICD-10-CM | POA: Diagnosis not present

## 2023-12-05 DIAGNOSIS — I10 Essential (primary) hypertension: Secondary | ICD-10-CM | POA: Diagnosis not present

## 2023-12-05 DIAGNOSIS — E785 Hyperlipidemia, unspecified: Secondary | ICD-10-CM | POA: Diagnosis not present

## 2023-12-05 DIAGNOSIS — L231 Allergic contact dermatitis due to adhesives: Secondary | ICD-10-CM | POA: Diagnosis not present

## 2023-12-15 DIAGNOSIS — F32A Depression, unspecified: Secondary | ICD-10-CM | POA: Diagnosis not present

## 2023-12-15 DIAGNOSIS — I480 Paroxysmal atrial fibrillation: Secondary | ICD-10-CM | POA: Diagnosis not present

## 2023-12-15 DIAGNOSIS — E039 Hypothyroidism, unspecified: Secondary | ICD-10-CM | POA: Diagnosis not present

## 2023-12-15 DIAGNOSIS — I10 Essential (primary) hypertension: Secondary | ICD-10-CM | POA: Diagnosis not present

## 2023-12-15 DIAGNOSIS — S2242XD Multiple fractures of ribs, left side, subsequent encounter for fracture with routine healing: Secondary | ICD-10-CM | POA: Diagnosis not present

## 2023-12-18 DIAGNOSIS — F32A Depression, unspecified: Secondary | ICD-10-CM | POA: Diagnosis not present

## 2023-12-18 DIAGNOSIS — E785 Hyperlipidemia, unspecified: Secondary | ICD-10-CM | POA: Diagnosis not present

## 2023-12-18 DIAGNOSIS — E039 Hypothyroidism, unspecified: Secondary | ICD-10-CM | POA: Diagnosis not present

## 2023-12-18 DIAGNOSIS — Z9181 History of falling: Secondary | ICD-10-CM | POA: Diagnosis not present

## 2023-12-18 DIAGNOSIS — S22008D Other fracture of unspecified thoracic vertebra, subsequent encounter for fracture with routine healing: Secondary | ICD-10-CM | POA: Diagnosis not present

## 2023-12-18 DIAGNOSIS — S36039D Unspecified laceration of spleen, subsequent encounter: Secondary | ICD-10-CM | POA: Diagnosis not present

## 2023-12-18 DIAGNOSIS — I10 Essential (primary) hypertension: Secondary | ICD-10-CM | POA: Diagnosis not present

## 2023-12-18 DIAGNOSIS — Z950 Presence of cardiac pacemaker: Secondary | ICD-10-CM | POA: Diagnosis not present

## 2023-12-18 DIAGNOSIS — I480 Paroxysmal atrial fibrillation: Secondary | ICD-10-CM | POA: Diagnosis not present

## 2023-12-18 DIAGNOSIS — I2510 Atherosclerotic heart disease of native coronary artery without angina pectoris: Secondary | ICD-10-CM | POA: Diagnosis not present

## 2023-12-18 DIAGNOSIS — S270XXD Traumatic pneumothorax, subsequent encounter: Secondary | ICD-10-CM | POA: Diagnosis not present

## 2023-12-18 DIAGNOSIS — S2242XD Multiple fractures of ribs, left side, subsequent encounter for fracture with routine healing: Secondary | ICD-10-CM | POA: Diagnosis not present

## 2023-12-20 DIAGNOSIS — I472 Ventricular tachycardia, unspecified: Secondary | ICD-10-CM | POA: Diagnosis not present

## 2023-12-20 DIAGNOSIS — Z4502 Encounter for adjustment and management of automatic implantable cardiac defibrillator: Secondary | ICD-10-CM | POA: Diagnosis not present

## 2023-12-21 ENCOUNTER — Ambulatory Visit: Payer: PPO | Admitting: Family

## 2023-12-25 ENCOUNTER — Ambulatory Visit (INDEPENDENT_AMBULATORY_CARE_PROVIDER_SITE_OTHER): Payer: PPO | Admitting: Family

## 2023-12-25 ENCOUNTER — Encounter: Payer: Self-pay | Admitting: Family

## 2023-12-25 VITALS — BP 118/86 | HR 77 | Temp 97.6°F | Resp 16 | Ht 64.0 in

## 2023-12-25 DIAGNOSIS — I10 Essential (primary) hypertension: Secondary | ICD-10-CM

## 2023-12-25 DIAGNOSIS — R296 Repeated falls: Secondary | ICD-10-CM

## 2023-12-25 DIAGNOSIS — E039 Hypothyroidism, unspecified: Secondary | ICD-10-CM

## 2023-12-25 DIAGNOSIS — F411 Generalized anxiety disorder: Secondary | ICD-10-CM

## 2023-12-25 DIAGNOSIS — G8929 Other chronic pain: Secondary | ICD-10-CM | POA: Diagnosis not present

## 2023-12-25 DIAGNOSIS — M25511 Pain in right shoulder: Secondary | ICD-10-CM | POA: Diagnosis not present

## 2023-12-25 DIAGNOSIS — E782 Mixed hyperlipidemia: Secondary | ICD-10-CM

## 2023-12-25 DIAGNOSIS — I482 Chronic atrial fibrillation, unspecified: Secondary | ICD-10-CM | POA: Diagnosis not present

## 2023-12-25 DIAGNOSIS — R2681 Unsteadiness on feet: Secondary | ICD-10-CM

## 2023-12-25 MED ORDER — PAROXETINE HCL 30 MG PO TABS: 30.0000 mg | ORAL_TABLET | Freq: Every day | ORAL | 1 refills | Status: DC

## 2023-12-25 MED ORDER — ENSURE ACTIVE HIGH PROTEIN PO LIQD: 1.0000 | Freq: Every day | ORAL | 3 refills | Status: AC

## 2023-12-25 MED ORDER — CLONAZEPAM 0.5 MG PO TABS: 0.2500 mg | ORAL_TABLET | Freq: Every evening | ORAL | 3 refills | Status: DC | PRN

## 2023-12-25 MED ORDER — ACETAMINOPHEN 500 MG PO TABS: 500.0000 mg | ORAL_TABLET | Freq: Three times a day (TID) | ORAL | Status: AC | PRN

## 2023-12-25 NOTE — Progress Notes (Signed)
 Provider: Richarda Blade FNP-C  Levada Bowersox, Donalee Citrin, NP  Patient Care Team: Azka Steger, Donalee Citrin, NP as PCP - General (Family Medicine) Venancio Poisson, MD as Consulting Physician (Dermatology) Osborn Coho, MD (Inactive) as Consulting Physician (Otolaryngology) Sandy Salaam, MD as Referring Physician (Cardiology)  Extended Emergency Contact Information Primary Emergency Contact: Enrique Sack [place] Home Phone: [phone] Work Phone: [phone] Relation: Daughter Secondary Emergency Contact: Thressa Sheller [place] Home Phone: [phone] Mobile Phone: [phone] Relation: Granddaughter  Code Status:  DNR Goals of care: Advanced Directive information    12/25/2023    1:43 PM  Advanced Directives  Does Patient Have a Medical Advance Directive? Yes  Type of Estate agent of [place];Living will;Out of facility DNR (pink MOST or yellow form)  Does patient want to make changes to medical advance directive? No - Patient declined  Copy of Healthcare Power of Attorney in Chart? Yes - validated most recent copy scanned in chart (See row information)     Chief Complaint  Patient presents with   Follow-up    Discharge from [facility]     Discussed the use of AI scribe software for clinical note transcription with the patient, who gave verbal consent to proceed.  History of Present Illness   [name] "Park Liter" is a 89 year old female who presents with decreased appetite and generalized weakness. She is accompanied by her daughter, and granddaughter who is her primary caregiver. She is status post skilled rehab discharge post hospitalization after sustaining a fall at home.  She has experienced a significant decrease in appetite with fluctuations in her eating habits. Yesterday, she consumed a good breakfast of [place] sausage, scrambled eggs, and biscuits, but today she only ate half a piece of wheat bread with butter and  jelly, and a small amount of oatmeal. She does not feel hungry and lacks the desire to eat.  She experiences generalized weakness and fatigue, feeling as though she is not improving and lacks energy. Physical therapy was delayed due to her caregiver's illness but is scheduled to resume tomorrow.  She has chronic pain, particularly in her right shoulder, managed with Tylenol 500 mg in the morning and at night. She fell on her left side several months ago, which may have contributed to her current pain. She has a history of rib fractures, which were expected to heal within five to six weeks. She has not been using a lidocaine patch recently, which she used in the hospital for pain management.  She uses a walker for mobility since her discharge from rehab on December 16, 2023, and is working on regaining her strength and balance.  She experiences dry skin, particularly on her feet, and uses Vaseline to manage it. Her caregiver avoids applying it between her toes to prevent fungal infections.  She is currently taking Paxil 30 mg, which she cuts in half, and gabapentin 300 mg at night. She also uses clonazepam as needed for sleep issues.  No shortness of breath or chest pain. Chronic pain in the right shoulder. No issues with urination, such as strong smell or discoloration.    Past Medical History:  Diagnosis Date   Acid reflux    Arthritis    Atrial fibrillation (HCC)    Breast cancer (HCC) 2011   Left Breast Cancer   Cancer [facility]) 2011   Left Breast Cancer   Depression    Hyperlipidemia    Hypertension    Insomnia  Osteoporosis    Pacemaker 2010   WIITH DEFIB   Pelvic fracture (HCC) 2010   Thyroid disease    Past Surgical History:  Procedure Laterality Date   ABDOMINAL HYSTERECTOMY  35 yrs ago   BREAST LUMPECTOMY Left 05/23/10   CARDIAC DEFIBRILLATOR PLACEMENT  2 years ago   CHOLECYSTECTOMY  50 YEARS AGO   ELBOW SURGERY  2006    Allergies  Allergen Reactions   Codeine Nausea  Only    Outpatient Encounter Medications as of 12/25/2023  Medication Sig   Ascorbic Acid (VITAMIN C) 1000 MG tablet Take 500 mg by mouth daily. 1/2 tablet in the morning and 1/2 tablet in the evening.   Calcium Carb-Cholecalciferol 600-800 MG-UNIT TABS Take 1 tablet by mouth every morning.   gabapentin (NEURONTIN) 300 MG capsule Take 1 capsule (300 mg total) by mouth 2 (two) times daily.   methocarbamol (ROBAXIN) 750 MG tablet Take 1 tablet (750 mg total) by mouth every 8 (eight) hours as needed for muscle spasms.   metoprolol succinate (TOPROL-XL) 25 MG 24 hr tablet Take 25 mg by mouth daily.    Multiple Vitamins-Minerals (ABC PLUS PO) Take 1 tablet by mouth daily.   Nutritional Supplements (ENSURE ACTIVE HIGH PROTEIN) LIQD Take 1 Can by mouth daily.   Omega-3 1000 MG CAPS Take 1 capsule by mouth daily.   pantoprazole (PROTONIX) 40 MG tablet TAKE 1 TABLET BY MOUTH ONCE DAILY   polyethylene glycol (MIRALAX / GLYCOLAX) 17 g packet Take 17 g by mouth 2 (two) times daily.   pravastatin (PRAVACHOL) 10 MG tablet TAKE 1 TABLET BY MOUTH DAILY.   Probiotic Product (ALIGN) 4 MG CAPS Take 1 capsule (4 mg total) by mouth daily.   senna-docusate (SENOKOT-S) 8.6-50 MG tablet Take 1 tablet by mouth 2 (two) times daily.   [DISCONTINUED] acetaminophen (TYLENOL) 325 MG tablet Take 2 tablets (650 mg total) by mouth every 4 (four) hours as needed for mild pain (pain score 1-3), fever or headache.   [DISCONTINUED] clonazePAM (KLONOPIN) 0.5 MG tablet Take 0.25 mg by mouth at bedtime as needed for anxiety.   [DISCONTINUED] PARoxetine (PAXIL) 20 MG tablet TAKE 1 TABLET BY MOUTH DAILY.   acetaminophen (TYLENOL) 500 MG tablet Take 1 tablet (500 mg total) by mouth every 8 (eight) hours as needed for mild pain (pain score 1-3), fever or headache.   clonazePAM (KLONOPIN) 0.5 MG tablet Take 0.5 tablets (0.25 mg total) by mouth at bedtime as needed for anxiety.   PARoxetine (PAXIL) 30 MG tablet Take 1 tablet (30 mg total)  by mouth daily.   SYNTHROID 88 MCG tablet TAKE 1 TABLET (88 MCG TOTAL) BY MOUTH DAILY BEFORE BREAKFAST ON EMPTY STOMACH   [DISCONTINUED] bisacodyl (DULCOLAX) 10 MG suppository Place 1 suppository (10 mg total) rectally daily as needed for moderate constipation. (Patient not taking: Reported on 12/25/2023)   [DISCONTINUED] lidocaine (LIDODERM) 5 % Place 1 patch onto the skin daily. Remove & Discard patch within 12 hours or as directed by MD (Patient not taking: Reported on 12/25/2023)   [DISCONTINUED] lidocaine (LIDODERM) 5 % Place 1 patch onto the skin daily. Remove & Discard patch within 12 hours or as directed by MD (Patient not taking: Reported on 12/25/2023)   [DISCONTINUED] oxyCODONE (OXY IR/ROXICODONE) 5 MG immediate release tablet Take 1 tablet (5 mg total) by mouth every 4 (four) hours as needed for severe pain (pain score 7-10). (Patient not taking: Reported on 12/25/2023)   No facility-administered encounter medications on file as of  12/25/2023.    Review of Systems  Constitutional:  Negative for appetite change, chills, fatigue, fever and unexpected weight change.  HENT:  Negative for congestion, dental problem, ear discharge, ear pain, facial swelling, hearing loss, nosebleeds, postnasal drip, rhinorrhea, sinus pressure, sinus pain, sneezing, sore throat, tinnitus and trouble swallowing.   Eyes:  Negative for pain, discharge, redness, itching and visual disturbance.  Respiratory:  Negative for cough, chest tightness, shortness of breath and wheezing.   Cardiovascular:  Negative for chest pain, palpitations and leg swelling.  Gastrointestinal:  Negative for abdominal distention, abdominal pain, blood in stool, constipation, diarrhea, nausea and vomiting.  Endocrine: Negative for cold intolerance, heat intolerance, polydipsia, polyphagia and polyuria.  Genitourinary:  Negative for difficulty urinating, dysuria, flank pain, frequency and urgency.  Musculoskeletal:  Positive for arthralgias and  gait problem. Negative for back pain, joint swelling, myalgias, neck pain and neck stiffness.       Right shoulder chronic pain   Skin:  Negative for color change, pallor, rash and wound.  Neurological:  Negative for dizziness, syncope, speech difficulty, weakness, light-headedness, numbness and headaches.  Hematological:  Does not bruise/bleed easily.  Psychiatric/Behavioral:  Negative for agitation, behavioral problems, confusion, hallucinations, self-injury, sleep disturbance and suicidal ideas. The patient is nervous/anxious.     Immunization History  Administered Date(s) Administered   Fluad Quad(high Dose 65+) 08/01/2019, 08/17/2020, 08/29/2022   Fluad Trivalent(High Dose 65+) 07/31/2023   Influenza, High Dose Seasonal PF 07/31/2018, 08/21/2021   Influenza-Unspecified 06/12/2015   Moderna Sars-Covid-2 Vaccination 03/20/2021   PFIZER(Purple Top)SARS-COV-2 Vaccination 12/16/2019, 01/09/2020, 08/11/2020, 03/20/2021   Pneumococcal Conjugate-13 12/23/2016   Pneumococcal Polysaccharide-23 10/12/2003   Tdap 12/25/2017   Zoster Recombinant(Shingrix) 06/23/2021, 10/16/2021   Pertinent  Health Maintenance Due  Topic Date Due   INFLUENZA VACCINE  Completed   DEXA SCAN  Completed      12/19/2022   12:53 PM 05/25/2023    9:02 AM 07/31/2023    2:07 PM 08/16/2023    3:03 PM 12/25/2023    1:38 PM  Fall Risk  Falls in the past year? 0 0 1 1 1   Was there an injury with Fall? 0 0 1 0 1  Fall Risk Category Calculator 0 0 2 2 2   Patient at Risk for Falls Due to No Fall Risks No Fall Risks Impaired balance/gait    Fall risk Follow up Falls evaluation completed Falls evaluation completed Falls evaluation completed  Falls evaluation completed;Education provided;Falls prevention discussed   Functional Status Survey:    Vitals:   12/25/23 1336  BP: 118/86  Pulse: 77  Resp: 16  Temp: 97.6 F (36.4 C)  SpO2: 93%  Height: 5\' 4"  (1.626 m)   Body mass index is 29.4 kg/m. Physical  Exam Vitals reviewed.  Constitutional:      General: She is not in acute distress.    Appearance: Normal appearance. She is overweight. She is not ill-appearing or diaphoretic.  HENT:     Head: Normocephalic.     Right Ear: Tympanic membrane, ear canal and external ear normal. There is no impacted cerumen.     Left Ear: Tympanic membrane, ear canal and external ear normal. There is no impacted cerumen.     Nose: Nose normal. No congestion or rhinorrhea.     Mouth/Throat:     Mouth: Mucous membranes are moist.     Pharynx: Oropharynx is clear. No oropharyngeal exudate or posterior oropharyngeal erythema.  Eyes:     General: No scleral icterus.  Right eye: No discharge.        Left eye: No discharge.     Extraocular Movements: Extraocular movements intact.     Conjunctiva/sclera: Conjunctivae normal.     Pupils: Pupils are equal, round, and reactive to light.  Neck:     Vascular: No carotid bruit.  Cardiovascular:     Rate and Rhythm: Normal rate and regular rhythm.     Pulses: Normal pulses.     Heart sounds: Normal heart sounds. No murmur heard.    No friction rub. No gallop.  Pulmonary:     Effort: Pulmonary effort is normal. No respiratory distress.     Breath sounds: Normal breath sounds. No wheezing, rhonchi or rales.  Chest:     Chest wall: No tenderness.  Abdominal:     General: Bowel sounds are normal. There is no distension.     Palpations: Abdomen is soft. There is no mass.     Tenderness: There is no abdominal tenderness. There is no right CVA tenderness, left CVA tenderness, guarding or rebound.  Musculoskeletal:        General: No swelling or tenderness. Normal range of motion.     Cervical back: Normal range of motion. No rigidity or tenderness.     Right lower leg: No edema.     Left lower leg: No edema.  Lymphadenopathy:     Cervical: No cervical adenopathy.  Skin:    General: Skin is warm and dry.     Coloration: Skin is not pale.     Findings: No  bruising, erythema, lesion or rash.  Neurological:     Mental Status: She is alert and oriented to person, place, and time.     Cranial Nerves: No cranial nerve deficit.     Sensory: No sensory deficit.     Motor: No weakness.     Coordination: Coordination normal.     Gait: Gait abnormal.  Psychiatric:        Mood and Affect: Mood is anxious and depressed.        Speech: Speech normal.        Behavior: Behavior normal.        Cognition and Memory: She exhibits impaired recent memory.    Labs reviewed: Recent Labs    11/22/23 0339 11/24/23 0643 11/26/23 0448  NA 138 136 137  K 4.5 4.2 4.6  CL 106 104 105  CO2 21* 23 19*  GLUCOSE 213* 128* 114*  BUN 19 17 20   CREATININE 0.66 0.67 0.67  CALCIUM 9.5 9.1 9.3  MG 2.3 2.1  --    Recent Labs    11/21/23 1923 11/22/23 0339 11/24/23 0643  AST 42* 48* 47*  ALT 21 26 42  ALKPHOS 39 40 44  BILITOT 0.8 0.9 1.2  PROT 6.8 6.8 5.8*  ALBUMIN 3.9 4.0 3.1*   Recent Labs    05/22/23 0954 11/21/23 1923 11/22/23 0339 11/22/23 1342 11/23/23 0937 11/24/23 0643 11/26/23 0448  WBC 5.7 17.2* 10.7*  --  11.6* 10.1 8.4  NEUTROABS 3,511 14.6* 9.1*  --   --   --   --   HGB 13.6 14.0 12.9   < > 14.0 12.8 12.9  HCT 41.5 44.9 40.3   < > 43.2 39.6 39.6  MCV 92.0 96.8 96.2  --  94.7 95.2 93.6  PLT 239 250 245  --  225 190 255   < > = values in this interval not displayed.   Lab Results  Component Value Date   TSH 0.61 05/22/2023   Lab Results  Component Value Date   HGBA1C 6.5 (H) 05/22/2023   Lab Results  Component Value Date   CHOL 149 05/22/2023   HDL 56 05/22/2023   LDLCALC 70 05/22/2023   TRIG 147 05/22/2023   CHOLHDL 2.7 05/22/2023    Significant Diagnostic Results in last 30 days:  No results found.  Assessment/Plan  Decreased Appetite and Nutritional Intake Variable appetite with recent hospitalization and need for physical therapy. Emphasized nutrition for energy and muscle mass. Discussed Ensure as a  supplement. Patient expressed doubts about recovery, potentially affecting appetite. Encouraged small, frequent meals and hydration. - Encourage small, frequent meals - Introduce Ensure as a nutritional supplement between meals - Monitor nutritional intake and adjust as needed  Unsteady gait Recovering from a fall, using a walker. Recently discharged from rehab, starting home physical therapy. Emphasized walker use to prevent falls and gradual progress through physical therapy. Discussed improved mobility with consistent therapy and proper nutrition. - Continue using the walker for mobility - Start physical therapy sessions twice a week for two weeks, then once a week for two weeks - Encourage gradual increase in physical activity  Chronic Pain Chronic pain, particularly in the right shoulder, possibly related to a previous fall. Managing with Tylenol, may need dosage adjustment. Discussed pain management importance, especially with physical therapy. Recommended increasing Tylenol to 1000 mg in the morning and evening, with an additional 500 mg in the afternoon if needed. - Administer Tylenol 1000 mg in the morning and evening, with an additional 500 mg in the afternoon if needed - Continue physical therapy to improve mobility and strength - Monitor pain levels and adjust medication as necessary - acetaminophen (TYLENOL) 500 MG tablet; Take 1 tablet (500 mg total) by mouth every 8 (eight) hours as needed for mild pain (pain score 1-3), fever or headache.  Essential hypertension (Primary) B/p well controlled  - continue on Metoprolol - TSH - COMPLETE METABOLIC PANEL WITH GFR - CBC with Differential/Platelet   Mixed hyperlipidemia No recent LDL for review  - continue on pravastatin  - Lipid panel  Chronic atrial fibrillation (HCC) HR controlled  - continue on Metoprolol  - CBC with Differential/Platelet  Generalized anxiety disorder Continue on Paxil 30 mg recently increased from 20  mg tablet daily  - TSH - PARoxetine (PAXIL) 30 MG tablet; Take 1 tablet (30 mg total) by mouth daily.  Dispense: 90 tablet; Refill: 1 - clonazePAM (KLONOPIN) 0.5 MG tablet; Take 0.5 tablets (0.25 mg total) by mouth at bedtime as needed for anxiety.  Dispense: 30 tablet; Refill: 3  Acquired hypothyroidism Continue on Synthroid 88 mcg tablet daily on empty stomach - TSH   Falling episodes Fall and safety precaution  - Home health PT as ordered during Skilled discharge.   Skin Care and Foot Health Issues with dry skin and toenail problems. Recommended specific skin care routines to prevent fungal infections and maintain skin health. Advised against applying cream between toes. Discussed silicone spacers for toenail issues and regular foot hygiene. - Use Cetaphil cream for dry skin, avoiding application between toes - Schedule a podiatrist appointment for toenail care and potential use of silicone spacers - Continue regular foot hygiene and moisturizing  Medication Management On multiple medications including gabapentin, Paxil, and clonazepam. Discussed appropriate dosages and need for refills. Adjusted regimen for effective pain and sleep management. - Continue gabapentin 300 mg at night - Continue Paxil 30 mg daily -  Use clonazepam as needed for sleep issues - Send prescription refills to the pharmacy  General Health Maintenance Needs adequate hydration and nutritional intake for overall health and recovery. Emphasized clear urine as an indicator of proper hydration. Ordered blood work for infection and kidney function. - Increase water intake to ensure clear urine - Monitor for signs of infection and dehydration - Order blood work to check for infection and kidney function  Follow-up - Schedule follow-up appointments as needed - Monitor progress with physical therapy - Ensure timely administration of medications and nutritional supplements.    Family/ staff Communication:  Reviewed plan of care with patient verbalized understanding   Labs/tests ordered:  - TSH - COMPLETE METABOLIC PANEL WITH GFR - CBC with Differential/Platelet - Lipid panel  Next Appointment: Return in about 6 months (around 06/23/2024) for medical mangement of chronic issues..   Total time: minutes. Greater than 50% of total time spent doing patient education regarding T2DM,HTN, HLD,chronic back pain,health maintenance including symptom/medication management.   Caesar Bookman, NP

## 2023-12-26 ENCOUNTER — Other Ambulatory Visit: Payer: Self-pay | Admitting: Family

## 2023-12-26 ENCOUNTER — Telehealth: Payer: Self-pay | Admitting: Emergency Medicine

## 2023-12-26 DIAGNOSIS — E039 Hypothyroidism, unspecified: Secondary | ICD-10-CM

## 2023-12-26 LAB — CBC WITH DIFFERENTIAL/PLATELET
Absolute Lymphocytes: 1436 {cells}/uL (ref 850–3900)
Absolute Monocytes: 621 {cells}/uL (ref 200–950)
Basophils Absolute: 39 {cells}/uL (ref 0–200)
Basophils Relative: 0.4 %
Eosinophils Absolute: 243 {cells}/uL (ref 15–500)
Eosinophils Relative: 2.5 %
HCT: 44.2 % (ref 35.0–45.0)
Hemoglobin: 14.7 g/dL (ref 11.7–15.5)
MCH: 30.7 pg (ref 27.0–33.0)
MCHC: 33.3 g/dL (ref 32.0–36.0)
MCV: 92.3 fL (ref 80.0–100.0)
MPV: 10.4 fL (ref 7.5–12.5)
Monocytes Relative: 6.4 %
Neutro Abs: 7362 {cells}/uL (ref 1500–7800)
Neutrophils Relative %: 75.9 %
Platelets: 333 10*3/uL (ref 140–400)
RBC: 4.79 10*6/uL (ref 3.80–5.10)
RDW: 13.3 % (ref 11.0–15.0)
Total Lymphocyte: 14.8 %
WBC: 9.7 10*3/uL (ref 3.8–10.8)

## 2023-12-26 LAB — COMPLETE METABOLIC PANEL WITH GFR
AG Ratio: 1.6 (calc) (ref 1.0–2.5)
ALT: 9 U/L (ref 6–29)
AST: 19 U/L (ref 10–35)
Albumin: 4.3 g/dL (ref 3.6–5.1)
Alkaline phosphatase (APISO): 120 U/L (ref 37–153)
BUN: 12 mg/dL (ref 7–25)
CO2: 31 mmol/L (ref 20–32)
Calcium: 10.5 mg/dL — ABNORMAL HIGH (ref 8.6–10.4)
Chloride: 102 mmol/L (ref 98–110)
Creat: 0.66 mg/dL (ref 0.60–0.95)
Globulin: 2.7 g/dL (ref 1.9–3.7)
Glucose, Bld: 116 mg/dL (ref 65–139)
Potassium: 5 mmol/L (ref 3.5–5.3)
Sodium: 141 mmol/L (ref 135–146)
Total Bilirubin: 0.5 mg/dL (ref 0.2–1.2)
Total Protein: 7 g/dL (ref 6.1–8.1)
eGFR: 81 mL/min/{1.73_m2} (ref >=60)

## 2023-12-26 LAB — LIPID PANEL
Cholesterol: 151 mg/dL (ref <200)
HDL: 57 mg/dL (ref >=50)
LDL Cholesterol (Calc): 71 mg/dL
Non-HDL Cholesterol (Calc): 94 mg/dL (ref <130)
Total CHOL/HDL Ratio: 2.6 (calc) (ref <5.0)
Triglycerides: 156 mg/dL — ABNORMAL HIGH (ref <150)

## 2023-12-26 LAB — TSH: TSH: 6.77 m[IU]/L — ABNORMAL HIGH (ref 0.40–4.50)

## 2023-12-26 MED ORDER — SYNTHROID 100 MCG PO TABS: 100.0000 ug | ORAL_TABLET | Freq: Every day | ORAL | 1 refills | Status: DC

## 2023-12-26 NOTE — Telephone Encounter (Signed)
 Occupational therapist with [facility] called requesting verbal orders for OT eval this week. Missed visit last week due to daughter being sick   Verbal orders provided

## 2023-12-26 NOTE — Telephone Encounter (Signed)
 Please give verbal orders per [facility] policy.

## 2023-12-26 NOTE — Progress Notes (Signed)
 Synthroid 100 mcg tablet one by mouth daily on empty stomach prescription sent to pharmacy.

## 2024-01-10 DIAGNOSIS — Z7901 Long term (current) use of anticoagulants: Secondary | ICD-10-CM | POA: Diagnosis not present

## 2024-01-10 DIAGNOSIS — Z4502 Encounter for adjustment and management of automatic implantable cardiac defibrillator: Secondary | ICD-10-CM | POA: Diagnosis not present

## 2024-01-10 DIAGNOSIS — I472 Ventricular tachycardia, unspecified: Secondary | ICD-10-CM | POA: Diagnosis not present

## 2024-01-10 DIAGNOSIS — I10 Essential (primary) hypertension: Secondary | ICD-10-CM | POA: Diagnosis not present

## 2024-01-10 DIAGNOSIS — I4821 Permanent atrial fibrillation: Secondary | ICD-10-CM | POA: Diagnosis not present

## 2024-01-10 DIAGNOSIS — Z95810 Presence of automatic (implantable) cardiac defibrillator: Secondary | ICD-10-CM | POA: Diagnosis not present

## 2024-02-12 ENCOUNTER — Telehealth: Payer: Self-pay | Admitting: *Deleted

## 2024-02-12 DIAGNOSIS — E039 Hypothyroidism, unspecified: Secondary | ICD-10-CM

## 2024-02-12 NOTE — Telephone Encounter (Signed)
 Copied from CRM [id number]. Topic: Clinical - Request for Lab/Test Order >> Feb 12, 2024  9:52 AM Elmarie Shiley H wrote: Patient's granddaughter Levester Fresh called to request lab orders for TSH to be submitted per Dinah's directives at patient's last appointment eight weeks ago.   Please call Stacy when the order has been placed to schedule.    Called and spoke with [name] and scheduled a lab appointment for 4/14. Orders placed.

## 2024-02-19 ENCOUNTER — Other Ambulatory Visit

## 2024-02-19 DIAGNOSIS — E039 Hypothyroidism, unspecified: Secondary | ICD-10-CM | POA: Diagnosis not present

## 2024-02-19 LAB — TSH: TSH: 0.29 m[IU]/L — ABNORMAL LOW (ref 0.40–4.50)

## 2024-02-20 NOTE — Telephone Encounter (Signed)
Message routed to PCP Ngetich, Dinah C, NP  

## 2024-02-22 ENCOUNTER — Other Ambulatory Visit: Payer: Self-pay

## 2024-02-22 DIAGNOSIS — E038 Other specified hypothyroidism: Secondary | ICD-10-CM

## 2024-02-22 DIAGNOSIS — E039 Hypothyroidism, unspecified: Secondary | ICD-10-CM

## 2024-02-22 MED ORDER — LEVOTHYROXINE SODIUM 88 MCG PO TABS: 88.0000 ug | ORAL_TABLET | Freq: Every day | ORAL | 1 refills | Status: DC

## 2024-04-19 ENCOUNTER — Other Ambulatory Visit

## 2024-04-19 DIAGNOSIS — E039 Hypothyroidism, unspecified: Secondary | ICD-10-CM

## 2024-04-20 LAB — TSH: TSH: 0.56 m[IU]/L (ref 0.40–4.50)

## 2024-04-22 ENCOUNTER — Other Ambulatory Visit: Payer: PPO

## 2024-04-23 ENCOUNTER — Ambulatory Visit: Payer: Self-pay | Admitting: Family

## 2024-05-06 ENCOUNTER — Other Ambulatory Visit: Payer: Self-pay | Admitting: Family

## 2024-05-06 DIAGNOSIS — E785 Hyperlipidemia, unspecified: Secondary | ICD-10-CM

## 2024-06-04 ENCOUNTER — Ambulatory Visit: Admitting: Podiatry

## 2024-06-04 ENCOUNTER — Encounter: Payer: Self-pay | Admitting: Podiatry

## 2024-06-04 DIAGNOSIS — B351 Tinea unguium: Secondary | ICD-10-CM | POA: Diagnosis not present

## 2024-06-04 DIAGNOSIS — M79676 Pain in unspecified toe(s): Secondary | ICD-10-CM | POA: Diagnosis not present

## 2024-06-04 DIAGNOSIS — D2370 Other benign neoplasm of skin of unspecified lower limb, including hip: Secondary | ICD-10-CM

## 2024-06-04 NOTE — Progress Notes (Signed)
 She presents today with her granddaughter for chief complaint of painful elongated toenails and calluses to the great toes bilateral.  Objective: Vital signs are stable oriented x 3 pulses are palpable.  Palpable varicosities are noted bilateral mild benign skin lesions to the medial aspect of the first metatarsophalangeal joints bilaterally.  Also painful elongated toenails mildly dystrophic.  Assessment: Pain limb secondary to onychomycosis and nail dystrophy.  Secondary to benign neoplasms bilateral.  Plan: Debridement of benign neoplasms bilateral debridement of toenails 1 through 5 bilateral covered service.

## 2024-06-12 DIAGNOSIS — Z95810 Presence of automatic (implantable) cardiac defibrillator: Secondary | ICD-10-CM | POA: Diagnosis not present

## 2024-06-24 ENCOUNTER — Ambulatory Visit: Payer: PPO | Admitting: Family

## 2024-06-24 ENCOUNTER — Other Ambulatory Visit: Payer: Self-pay | Admitting: Family

## 2024-06-24 DIAGNOSIS — F411 Generalized anxiety disorder: Secondary | ICD-10-CM

## 2024-07-07 DIAGNOSIS — R82998 Other abnormal findings in urine: Secondary | ICD-10-CM | POA: Diagnosis not present

## 2024-07-07 DIAGNOSIS — R441 Visual hallucinations: Secondary | ICD-10-CM | POA: Diagnosis not present

## 2024-07-07 DIAGNOSIS — M545 Low back pain, unspecified: Secondary | ICD-10-CM | POA: Diagnosis not present

## 2024-07-09 ENCOUNTER — Other Ambulatory Visit

## 2024-07-09 DIAGNOSIS — I10 Essential (primary) hypertension: Secondary | ICD-10-CM | POA: Diagnosis not present

## 2024-07-09 DIAGNOSIS — R7303 Prediabetes: Secondary | ICD-10-CM | POA: Diagnosis not present

## 2024-07-09 DIAGNOSIS — I482 Chronic atrial fibrillation, unspecified: Secondary | ICD-10-CM | POA: Diagnosis not present

## 2024-07-09 DIAGNOSIS — E782 Mixed hyperlipidemia: Secondary | ICD-10-CM | POA: Diagnosis not present

## 2024-07-10 ENCOUNTER — Ambulatory Visit: Payer: Self-pay | Admitting: Family

## 2024-07-10 LAB — CBC WITH DIFFERENTIAL/PLATELET
Absolute Lymphocytes: 1890 {cells}/uL (ref 850–3900)
Absolute Monocytes: 432 {cells}/uL (ref 200–950)
Basophils Absolute: 38 {cells}/uL (ref 0–200)
Basophils Relative: 0.7 %
Eosinophils Absolute: 221 {cells}/uL (ref 15–500)
Eosinophils Relative: 4.1 %
HCT: 42.4 % (ref 35.0–45.0)
Hemoglobin: 13.8 g/dL (ref 11.7–15.5)
MCH: 30.9 pg (ref 27.0–33.0)
MCHC: 32.5 g/dL (ref 32.0–36.0)
MCV: 94.9 fL (ref 80.0–100.0)
MPV: 10.3 fL (ref 7.5–12.5)
Monocytes Relative: 8 %
Neutro Abs: 2819 {cells}/uL (ref 1500–7800)
Neutrophils Relative %: 52.2 %
Platelets: 272 Thousand/uL (ref 140–400)
RBC: 4.47 Million/uL (ref 3.80–5.10)
RDW: 13.7 % (ref 11.0–15.0)
Total Lymphocyte: 35 %
WBC: 5.4 Thousand/uL (ref 3.8–10.8)

## 2024-07-10 LAB — COMPREHENSIVE METABOLIC PANEL WITH GFR
AG Ratio: 1.9 (calc) (ref 1.0–2.5)
ALT: 19 U/L (ref 6–29)
AST: 28 U/L (ref 10–35)
Albumin: 4.2 g/dL (ref 3.6–5.1)
Alkaline phosphatase (APISO): 41 U/L (ref 37–153)
BUN: 17 mg/dL (ref 7–25)
CO2: 27 mmol/L (ref 20–32)
Calcium: 10 mg/dL (ref 8.6–10.4)
Chloride: 107 mmol/L (ref 98–110)
Creat: 0.81 mg/dL (ref 0.60–0.95)
Globulin: 2.2 g/dL (ref 1.9–3.7)
Glucose, Bld: 122 mg/dL — ABNORMAL HIGH (ref 65–99)
Potassium: 4.3 mmol/L (ref 3.5–5.3)
Sodium: 143 mmol/L (ref 135–146)
Total Bilirubin: 0.7 mg/dL (ref 0.2–1.2)
Total Protein: 6.4 g/dL (ref 6.1–8.1)
eGFR: 67 mL/min/1.73m2 (ref >=60)

## 2024-07-10 LAB — LIPID PANEL
Cholesterol: 149 mg/dL (ref <200)
HDL: 51 mg/dL (ref >=50)
LDL Cholesterol (Calc): 74 mg/dL
Non-HDL Cholesterol (Calc): 98 mg/dL (ref <130)
Total CHOL/HDL Ratio: 2.9 (calc) (ref <5.0)
Triglycerides: 160 mg/dL — ABNORMAL HIGH (ref <150)

## 2024-07-10 LAB — TSH: TSH: 1.07 m[IU]/L (ref 0.40–4.50)

## 2024-07-10 LAB — HEMOGLOBIN A1C
Hgb A1c MFr Bld: 6.3 % — ABNORMAL HIGH (ref <5.7)
Mean Plasma Glucose: 134 mg/dL
eAG (mmol/L): 7.4 mmol/L

## 2024-07-11 ENCOUNTER — Encounter: Payer: Self-pay | Admitting: Family

## 2024-07-11 ENCOUNTER — Ambulatory Visit: Admitting: Family

## 2024-07-11 VITALS — BP 118/72 | HR 70 | Temp 97.8°F | Resp 17 | Ht 64.0 in | Wt 150.2 lb

## 2024-07-11 DIAGNOSIS — E039 Hypothyroidism, unspecified: Secondary | ICD-10-CM

## 2024-07-11 DIAGNOSIS — I482 Chronic atrial fibrillation, unspecified: Secondary | ICD-10-CM

## 2024-07-11 DIAGNOSIS — Z23 Encounter for immunization: Secondary | ICD-10-CM | POA: Diagnosis not present

## 2024-07-11 DIAGNOSIS — I10 Essential (primary) hypertension: Secondary | ICD-10-CM | POA: Diagnosis not present

## 2024-07-11 DIAGNOSIS — R0981 Nasal congestion: Secondary | ICD-10-CM

## 2024-07-11 DIAGNOSIS — R059 Cough, unspecified: Secondary | ICD-10-CM | POA: Diagnosis not present

## 2024-07-11 DIAGNOSIS — E781 Pure hyperglyceridemia: Secondary | ICD-10-CM

## 2024-07-11 DIAGNOSIS — R634 Abnormal weight loss: Secondary | ICD-10-CM | POA: Diagnosis not present

## 2024-07-11 DIAGNOSIS — F5101 Primary insomnia: Secondary | ICD-10-CM | POA: Diagnosis not present

## 2024-07-11 DIAGNOSIS — E782 Mixed hyperlipidemia: Secondary | ICD-10-CM

## 2024-07-11 DIAGNOSIS — R443 Hallucinations, unspecified: Secondary | ICD-10-CM

## 2024-07-11 DIAGNOSIS — R7303 Prediabetes: Secondary | ICD-10-CM | POA: Diagnosis not present

## 2024-07-11 NOTE — Progress Notes (Addendum)
 Provider: Roxan Plough FNP-C   Tywone Bembenek, Roxan BROCKS, NP  Patient Care Team: Carmela Piechowski, Roxan BROCKS, NP as PCP - General (Family Medicine) Cary Doffing, MD as Consulting Physician (Dermatology) Mable Lenis, MD (Inactive) as Consulting Physician (Otolaryngology) Meldon Lash, MD as Referring Physician (Cardiology)  Extended Emergency Contact Information Primary Emergency Contact: Creed,Judy  [place] Home Phone: [phone] Work Phone: [phone] Relation: Daughter Secondary Emergency Contact: Creed,Stacy  [place] Home Phone: [phone] Mobile Phone: [phone] Relation: Granddaughter  Code Status:   DNR Goals of care: Advanced Directive information    07/11/2024    3:25 PM  Advanced Directives  Does Patient Have a Medical Advance Directive? Yes  Type of Advance Directive Out of facility DNR (pink MOST or yellow form)  Does patient want to make changes to medical advance directive? No - Patient declined     Chief Complaint  Patient presents with   Medical Management of Chronic Issues    6 Month follow up.   Discussed the use of AI scribe software for clinical note transcription with the patient, who gave verbal consent to proceed.  History of Present Illness   [name] is a 89 year old female who presents for a six-month follow-up visit. She is accompanied by her two daughters and granddaughter,Her regular Granddaughter who is primary care giver not available but sent written concerns and questions with family.  She experiences nocturnal hallucinations, seeing a man and a woman in her room around 3 AM, despite having a burglar alarm and locked doors. These episodes have led her to sleep with a lamp on, disrupting her sleep pattern. She questions whether her medications, specifically Paxil  or gabapentin , could be causing these hallucinations. She has been on Paxil  for a long time and recently stopped gabapentin  though was  using as needed. She has not felt depressed and denies current depressive symptoms.  She has a history of falls, with a significant fall occurring six months ago, leading to hospitalization. During that time, she was advised by a trauma doctor to consult her cardiologist about her blood thinner due to the risk of bleeding if she falls again. She is currently off blood thinners and expresses concern about the risk of stroke versus bleeding.  She is on multiple medications including metoprolol  25 mg for blood pressure, levothyroxine  88 mcg for thyroid , pravastatin , multivitamins, calcium  with vitamin D , omega-3 fatty acids, probiotics, vitamin C 1000 mg, pantoprazole  as needed, Robaxin  as needed, Miralax  as needed, Senokot as needed, and Tylenol  as needed. She has stopped gabapentin  and is considering the use of Klonopin  for sleep but is concerned about its effects.  She reports a significant weight loss from 166 lbs to 150 lbs over six months. Her diet consists of two meals a day and occasional protein supplements like Ensure. Her family encourages her to drink more water, especially due to a recent urinary tract infection.  She experiences morning nasal congestion and cough, producing white phlegm, and uses an over-the-counter grapefruit product for relief. She inquires about using Claritin  for these symptoms.  Her granddaughter notes that she does not eat much and has a preference for sweets, which may be contributing to her weight loss and elevated triglycerides.    Past Medical History:  Diagnosis Date   Acid reflux    Arthritis    Atrial fibrillation (HCC)    Breast cancer (HCC) 2011   Left Breast Cancer   Cancer [facility]) 2011   Left Breast Cancer  Depression    Hyperlipidemia    Hypertension    Insomnia    Osteoporosis    Pacemaker 2010   WIITH DEFIB   Pelvic fracture (HCC) 2010   Thyroid  disease    Past Surgical History:  Procedure Laterality Date   ABDOMINAL HYSTERECTOMY  35  yrs ago   BREAST LUMPECTOMY Left 05/23/10   CARDIAC DEFIBRILLATOR PLACEMENT  2 years ago   CHOLECYSTECTOMY  50 YEARS AGO   ELBOW SURGERY  2006    Allergies  Allergen Reactions   Codeine Nausea Only    Allergies as of 07/11/2024       Reactions   Codeine Nausea Only        Medication List        Accurate as of July 11, 2024 11:59 PM. If you have any questions, ask your nurse or doctor.          ABC PLUS PO Take 1 tablet by mouth daily.   acetaminophen  500 MG tablet Commonly known as: TYLENOL  Take 1 tablet (500 mg total) by mouth every 8 (eight) hours as needed for mild pain (pain score 1-3), fever or headache.   Align 4 MG Caps Take 1 capsule (4 mg total) by mouth daily.   Calcium  Carb-Cholecalciferol  600-800 MG-UNIT Tabs Take 1 tablet by mouth every morning.   clonazePAM  0.5 MG tablet Commonly known as: KLONOPIN  Take 0.5 tablets (0.25 mg total) by mouth at bedtime as needed for anxiety.   Ensure Active High Protein Liqd Take 1 Can by mouth daily.   gabapentin  300 MG capsule Commonly known as: NEURONTIN  Take 1 capsule (300 mg total) by mouth 2 (two) times daily.   levothyroxine  88 MCG tablet Commonly known as: Synthroid  Take 1 tablet (88 mcg total) by mouth daily before breakfast.   methocarbamol  750 MG tablet Commonly known as: ROBAXIN  Take 1 tablet (750 mg total) by mouth every 8 (eight) hours as needed for muscle spasms.   metoprolol  succinate 25 MG 24 hr tablet Commonly known as: TOPROL -XL Take 25 mg by mouth daily.   Omega-3 1000 MG Caps Take 1 capsule by mouth daily.   pantoprazole  40 MG tablet Commonly known as: PROTONIX  TAKE 1 TABLET BY MOUTH ONCE DAILY What changed:  when to take this reasons to take this   PARoxetine  30 MG tablet Commonly known as: PAXIL  TAKE 1 TABLET (30 MG TOTAL) BY MOUTH DAILY.   polyethylene glycol 17 g packet Commonly known as: MIRALAX  / GLYCOLAX  Take 17 g by mouth 2 (two) times daily.   pravastatin  10  MG tablet Commonly known as: PRAVACHOL  TAKE 1 TABLET BY MOUTH DAILY.   senna-docusate 8.6-50 MG tablet Commonly known as: Senokot-S Take 1 tablet by mouth 2 (two) times daily. What changed:  when to take this reasons to take this   vitamin C 1000 MG tablet Take 500 mg by mouth daily. 1/2 tablet in the morning and 1/2 tablet in the evening.        Review of Systems  Constitutional:  Negative for appetite change, chills, fatigue, fever and unexpected weight change.  HENT:  Positive for congestion. Negative for dental problem, ear discharge, ear pain, facial swelling, hearing loss, nosebleeds, postnasal drip, rhinorrhea, sinus pressure, sinus pain, sneezing, sore throat, tinnitus and trouble swallowing.   Eyes:  Negative for pain, discharge, redness, itching and visual disturbance.  Respiratory:  Negative for cough, chest tightness, shortness of breath and wheezing.        Morning cough  Cardiovascular:  Negative for chest pain, palpitations and leg swelling.  Gastrointestinal:  Negative for abdominal distention, abdominal pain, blood in stool, constipation, diarrhea, nausea and vomiting.  Endocrine: Negative for cold intolerance, heat intolerance, polydipsia, polyphagia and polyuria.  Genitourinary:  Negative for difficulty urinating, dysuria, flank pain, frequency and urgency.  Musculoskeletal:  Positive for arthralgias, back pain and gait problem. Negative for joint swelling, myalgias, neck pain and neck stiffness.  Skin:  Negative for color change, pallor, rash and wound.  Neurological:  Negative for dizziness, syncope, speech difficulty, weakness, light-headedness, numbness and headaches.  Hematological:  Does not bruise/bleed easily.  Psychiatric/Behavioral:  Positive for hallucinations and sleep disturbance. Negative for agitation, behavioral problems, confusion, self-injury and suicidal ideas. The patient is not nervous/anxious.        Memory loss     Immunization History   Administered Date(s) Administered   Fluad Quad(high Dose 65+) 08/01/2019, 08/17/2020, 08/29/2022   Fluad Trivalent(High Dose 65+) 07/31/2023   INFLUENZA, HIGH DOSE SEASONAL PF 07/31/2018, 08/21/2021, 07/11/2024   Influenza-Unspecified 06/12/2015   Moderna Sars-Covid-2 Vaccination 03/20/2021   PFIZER(Purple Top)SARS-COV-2 Vaccination 12/16/2019, 01/09/2020, 08/11/2020, 03/20/2021   Pneumococcal Conjugate-13 12/23/2016   Pneumococcal Polysaccharide-23 10/12/2003   Tdap 12/25/2017   Zoster Recombinant(Shingrix) 06/23/2021, 10/16/2021   Pertinent  Health Maintenance Due  Topic Date Due   Influenza Vaccine  Completed   DEXA SCAN  Completed      05/25/2023    9:02 AM 07/31/2023    2:07 PM 08/16/2023    3:03 PM 12/25/2023    1:38 PM 07/11/2024    3:25 PM  Fall Risk  Falls in the past year? 0 1 1 1 1   Was there an injury with Fall? 0 1 0 1 1  Fall Risk Category Calculator 0 2 2 2 2   Patient at Risk for Falls Due to No Fall Risks Impaired balance/gait   History of fall(s)  Fall risk Follow up Falls evaluation completed Falls evaluation completed  Falls evaluation completed;Education provided;Falls prevention discussed Falls evaluation completed   Functional Status Survey:    Vitals:   07/11/24 1534  BP: 118/72  Pulse: 70  Resp: 17  Temp: 97.8 F (36.6 C)  SpO2: 94%  Weight: 150 lb 3.2 oz (68.1 kg)  Height: 5' 4 (1.626 m)   Body mass index is 25.78 kg/m. Physical Exam  MEASUREMENTS: Height- 5'4, Weight- 150, BMI- 25.0. GENERAL: Alert, cooperative, well developed, no acute distress HEENT: Normocephalic, normal oropharynx, moist mucous membranes, ears and nose normal NECK: Neck non-tender CHEST: Clear to auscultation bilaterally, no wheezes, rhonchi, or crackles CARDIOVASCULAR: Normal heart rate and rhythm, S1 and S2 normal without murmurs ABDOMEN: Soft, non-tender, non-distended, without organomegaly, normal bowel sounds EXTREMITIES: No cyanosis or edema, extremities  normal NEUROLOGICAL: Cranial nerves grossly intact, moves all extremities without gross motor or sensory deficit except limited right shoulder,lower back and knees due to pain.gait unsteady holds onto daughter but usually uses a walker  SKIN: No rash,no lesion or erythema   PSYCHIATRY/BEHAVIORAL: Mood stable    Labs reviewed: Recent Labs    11/22/23 0339 11/24/23 0643 11/26/23 0448 12/25/23 1440 07/09/24 1034  NA 138 136 137 141 143  K 4.5 4.2 4.6 5.0 4.3  CL 106 104 105 102 107  CO2 21* 23 19* 31 27  GLUCOSE 213* 128* 114* 116 122*  BUN 19 17 20 12 17   CREATININE 0.66 0.67 0.67 0.66 0.81  CALCIUM  9.5 9.1 9.3 10.5* 10.0  MG 2.3 2.1  --   --   --  Recent Labs    11/21/23 1923 11/22/23 0339 11/24/23 0643 12/25/23 1440 07/09/24 1034  AST 42* 48* 47* 19 28  ALT 21 26 42 9 19  ALKPHOS 39 40 44  --   --   BILITOT 0.8 0.9 1.2 0.5 0.7  PROT 6.8 6.8 5.8* 7.0 6.4  ALBUMIN 3.9 4.0 3.1*  --   --    Recent Labs    11/22/23 0339 11/22/23 1342 11/26/23 0448 12/25/23 1440 07/09/24 1034  WBC 10.7*   < > 8.4 9.7 5.4  NEUTROABS 9.1*  --   --  7,362 2,819  HGB 12.9   < > 12.9 14.7 13.8  HCT 40.3   < > 39.6 44.2 42.4  MCV 96.2   < > 93.6 92.3 94.9  PLT 245   < > 255 333 272   < > = values in this interval not displayed.   Lab Results  Component Value Date   TSH 1.07 07/09/2024   Lab Results  Component Value Date   HGBA1C 6.3 (H) 07/09/2024   Lab Results  Component Value Date   CHOL 149 07/09/2024   HDL 51 07/09/2024   LDLCALC 74 07/09/2024   TRIG 160 (H) 07/09/2024   CHOLHDL 2.9 07/09/2024    Significant Diagnostic Results in last 30 days:  No results found.  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 Hallucinations occur despite having the light on. Possible causes include age-related changes, sleep disturbances, or post-dream  states. No evidence of medication-induced hallucinations. - Encourage use of dimmable lights to improve sleep quality. - Advise against watching TV before bed to reduce blue light exposure. - Recommend listening to instrumental or white noise music to aid relaxation and sleep. - Encourage outdoor activities to increase natural light exposure.  Unintentional weight loss Weight decreased from 166 lbs to 150 lbs over six months. Current BMI is 25, within normal range, but further weight loss is discouraged due to potential weakness and fall risk. - Encourage eating three meals a day with adequate protein intake. - Advise against excessive consumption of sweets and carbohydrates. - Continue drinking Ensure for protein supplementation. - Monitor weight to prevent further loss.  Insomnia/Hallucintion  Difficulty sleeping at night, possibly due to light exposure and irregular sleep patterns. No current use of sleep aids. - Encourage establishing a regular sleep routine, going to bed by 10 PM. - Advise against long daytime naps to improve nighttime sleep quality. - Recommend listening to relaxation music or white noise to aid sleep.   morning cough and throat mucus Reports morning cough with white mucus production. No significant findings on sinus x-ray. Possible age-related changes or post-nasal drip considered. - Recommend trying Claritin  for symptomatic relief.  Prediabetes A1c is 6.3, indicating prediabetes. Emphasis on dietary modifications to prevent progression to diabetes. - Advise reducing intake of sugary foods and carbohydrates. - Monitor A1c levels regularly.  Hypertriglyceridemia Triglycerides increased from 156 to 160 mg/dL. Dietary factors, particularly starchy and sugary foods, are contributing to elevated levels. - Advise reducing intake of starchy foods and sweets. - Monitor triglyceride levels.  Essential hypertension Currently managed with metoprolol  25  mg.  Hypothyroidism Managed with levothyroxine  88 mcg. Thyroid  function tests are within normal limits.  Goals of Care She prefers to avoid stroke over the risk of bleeding from blood thinners, expressing a preference to die from bleeding rather  than having a stroke, as her family cannot care for her if she becomes disabled from a stroke.  General Health Maintenance Discussion on maintaining overall health through diet and lifestyle modifications. - Encourage increased water intake to prevent dehydration and support urinary health. - Advise on the importance of balanced meals and reducing sweets to maintain weight and prevent diabetes.  Follow-up Plan for ongoing monitoring and management of health conditions. - Schedule follow-up appointment in six months with lab work  prior to the visit.   Family/ staff Communication: Reviewed plan of care with patient,daughters and granddaughter   Labs/tests ordered:  - CBC with Differential/Platelet - CMP with eGFR([facility]) - TSH - Hgb A1C - Lipid panel  Next Appointment : Return in about 6 months (around 01/08/2025) for medical mangement of chronic issues., Fasting labs in 6 months prior to visit.   Spent 43 minutes of Face to face and non-face to face with patient  >50% time spent counseling; reviewing medical record; tests; labs; documentation and developing future plan of care.   Roxan JAYSON Plough, NP

## 2024-07-16 ENCOUNTER — Other Ambulatory Visit: Payer: Self-pay | Admitting: Family

## 2024-07-16 DIAGNOSIS — R443 Hallucinations, unspecified: Secondary | ICD-10-CM

## 2024-07-16 MED ORDER — QUETIAPINE FUMARATE 25 MG PO TABS: 12.5000 mg | ORAL_TABLET | Freq: Every day | ORAL | 3 refills | Status: DC

## 2024-07-16 NOTE — Telephone Encounter (Signed)
 Patient response routed to PCP Ngetich, Dinah C, NP

## 2024-07-17 ENCOUNTER — Other Ambulatory Visit: Payer: Self-pay | Admitting: Family

## 2024-07-17 DIAGNOSIS — R4189 Other symptoms and signs involving cognitive functions and awareness: Secondary | ICD-10-CM

## 2024-07-18 ENCOUNTER — Telehealth: Payer: Self-pay

## 2024-07-18 NOTE — Telephone Encounter (Signed)
 Copied from CRM [id number]. Topic: Clinical - Medical Advice >> Jul 18, 2024 12:18 PM Zane F wrote: Reason for CRM:   Patient 's grandaughter, Harlene, is looking to confirm if the upcoming lab requested are indeed fasting labs. Please be sure to call her back to confirm.  Callback Number: [id number]

## 2024-07-18 NOTE — Telephone Encounter (Signed)
 I left a detailed message informing [name] that labs are fasting, nothing to eat or drink 6-8 hours prior with the exception of black coffee (no additives) and water.

## 2024-07-22 ENCOUNTER — Other Ambulatory Visit

## 2024-07-22 DIAGNOSIS — R4189 Other symptoms and signs involving cognitive functions and awareness: Secondary | ICD-10-CM | POA: Diagnosis not present

## 2024-07-28 LAB — AD-DETECT ABETA 42/40 AND P-TAU217 EVALUATION, PLASMA
ABETA 40: 417 pg/mL
ABETA 42/40 RATIO: 0.168 — ABNORMAL LOW (ref >=0.170)
ABETA 42: 70 pg/mL
AD DETECT PTAU217: 0.43 pg/mL — ABNORMAL HIGH (ref <=0.15)
LIKELIHOOD SCORE: 0.4463 — ABNORMAL HIGH

## 2024-07-29 ENCOUNTER — Ambulatory Visit: Payer: Self-pay | Admitting: Family

## 2024-08-06 ENCOUNTER — Telehealth: Payer: Self-pay | Admitting: Family

## 2024-08-06 NOTE — Telephone Encounter (Signed)
 Form filled in and placed in providers review and sign folder for additional input.   Last OV 07/11/24

## 2024-08-06 NOTE — Telephone Encounter (Signed)
 Patients daughter Dagoberto dropped off paperwork from the [place] to be completed and returned. Given to Chrae in CI

## 2024-08-07 NOTE — Telephone Encounter (Signed)
 Forms completed please call patient's daughter Dagoberto Norlander # [phone] as requested.Forms given to Darice at the front desk to call POA.

## 2024-08-21 ENCOUNTER — Encounter: Payer: Self-pay | Admitting: Neurology

## 2024-08-21 ENCOUNTER — Ambulatory Visit: Admitting: Neurology

## 2024-08-21 VITALS — BP 128/86 | HR 74 | Resp 16 | Ht 64.0 in

## 2024-08-21 DIAGNOSIS — R443 Hallucinations, unspecified: Secondary | ICD-10-CM | POA: Diagnosis not present

## 2024-08-21 NOTE — Progress Notes (Signed)
 [facility]  PATIENT: [name] DOB: October 24, 1926  REQUESTING CLINICIAN: Ngetich, Roxan BROCKS, NP HISTORY FROM: Patient/Daughter and grand daughter  REASON FOR VISIT: Hallucinations    HISTORICAL  CHIEF COMPLAINT:  Chief Complaint  Patient presents with   New Patient (Initial Visit)    RM13, DAUGHTER/GRANDDAUGHTER PRESENT, Internal referral for hallucinations: Mmse SCORE OF 21    HISTORY OF PRESENT ILLNESS:  Discussed the use of AI scribe software for clinical note transcription with the patient, who gave verbal consent to proceed.  [name] is a 89 year old female who presents with visual hallucinations. She was referred by her primary doctor for evaluation of her visual hallucinations. She has a history of hypertension, hyperlipidemia, hypothyroidism, anxiety and depression.   She experiences visual hallucinations primarily at night, which began approximately two to three months ago after returning home from a hospital stay. She sees various figures, including a woman in a red skirt and striped blouse, her deceased brother dressed formally, and a gray cat. These figures appear around 3:00 to 3:30 AM, and she is usually awake or dozing when they occur. She acknowledges that these figures are not real but finds them distressing.  She has a history of a fall at home, resulting in rib injuries and a subsequent hospital stay followed by rehabilitation in January. Her hallucinations began after her return from the hospital, with no changes in her medication regimen coinciding with the onset of these symptoms.  Her current medications include Seroquel , recently added to help with sleep, taken as a half tablet at night. The hallucinations have not improved with this medication. She also takes Klonopin  as needed for anxiety and has not been taking gabapentin  recently. She feels tired and drowsy during the day, often staying in bed until late morning,  attributing this to resting rather than sleeping.  She lives with her granddaughter and her husband, who have installed an alarm system in the house. She is concerned about the risk of falling, especially at night when she gets up to use the bathroom. She uses a walker for mobility and avoids going upstairs due to fall risk.  Her past medical history includes atrial fibrillation, for which she was previously on blood thinners, but these were discontinued due to fall risk. She has a pacemaker and a history of rib fractures from a previous fall. She also reports a history of a concussion from a fall at work approximately 20 years ago.    OTHER MEDICAL CONDITIONS: Atrial fibrillation, Hypothyroidism, Hypertension, Hyperlipidemia, Depression and Anxiety    REVIEW OF SYSTEMS: Full 14 system review of systems performed and negative with exception of: As noted in the HPI   ALLERGIES: Allergies  Allergen Reactions   Codeine Nausea Only    HOME MEDICATIONS: Outpatient Medications Prior to Visit  Medication Sig Dispense Refill   acetaminophen  (TYLENOL ) 500 MG tablet Take 1 tablet (500 mg total) by mouth every 8 (eight) hours as needed for mild pain (pain score 1-3), fever or headache.     Ascorbic Acid (VITAMIN C) 1000 MG tablet Take 500 mg by mouth daily. 1/2 tablet in the morning and 1/2 tablet in the evening.     Camphor-Menthol -Methyl Sal (SALONPAS EX) Apply 1 Application topically daily as needed.     diclofenac  Sodium (VOLTAREN ) 1 % GEL Apply 2 g topically as needed.     furosemide  (LASIX ) 40 MG tablet Take 40 mg by mouth daily as needed.  gabapentin  (NEURONTIN ) 300 MG capsule Take 1 capsule (300 mg total) by mouth 2 (two) times daily.     levothyroxine  (SYNTHROID ) 88 MCG tablet Take 1 tablet (88 mcg total) by mouth daily before breakfast. 90 tablet 1   loratadine  (CLARITIN ) 10 MG tablet Take 10 mg by mouth daily.     metoprolol  succinate (TOPROL -XL) 25 MG 24 hr tablet Take 25 mg by mouth  daily.      Multiple Vitamins-Minerals (ABC PLUS PO) Take 1 tablet by mouth daily.     Multiple Vitamins-Minerals (CITRACAL +D3) TABS Take 25 mcg by mouth daily.     Omega-3 1000 MG CAPS Take 1 capsule by mouth daily.     pantoprazole  (PROTONIX ) 40 MG tablet TAKE 1 TABLET BY MOUTH ONCE DAILY 90 tablet 3   PARoxetine  (PAXIL ) 30 MG tablet TAKE 1 TABLET (30 MG TOTAL) BY MOUTH DAILY. 90 tablet 1   polyethylene glycol (MIRALAX  / GLYCOLAX ) 17 g packet Take 17 g by mouth daily as needed for mild constipation, moderate constipation or severe constipation.     potassium chloride  (MICRO-K ) 10 MEQ CR capsule Take 10 mEq by mouth daily as needed (WITH LASIX ).     pravastatin  (PRAVACHOL ) 10 MG tablet TAKE 1 TABLET BY MOUTH DAILY. 90 tablet 1   Probiotic Product (ALIGN) 4 MG CAPS Take 1 capsule (4 mg total) by mouth daily. 30 capsule 3   QUEtiapine  (SEROQUEL ) 25 MG tablet Take 0.5 tablets (12.5 mg total) by mouth at bedtime. 30 tablet 3   Nutritional Supplements (ENSURE ACTIVE HIGH PROTEIN) LIQD Take 1 Can by mouth daily. 237 mL 3   Calcium  Carb-Cholecalciferol  600-800 MG-UNIT TABS Take 1 tablet by mouth every morning.     clonazePAM  (KLONOPIN ) 0.5 MG tablet Take 0.5 tablets (0.25 mg total) by mouth at bedtime as needed for anxiety. 30 tablet 3   methocarbamol  (ROBAXIN ) 750 MG tablet Take 1 tablet (750 mg total) by mouth every 8 (eight) hours as needed for muscle spasms.     polyethylene glycol (MIRALAX  / GLYCOLAX ) 17 g packet Take 17 g by mouth 2 (two) times daily.     senna-docusate (SENOKOT-S) 8.6-50 MG tablet Take 1 tablet by mouth 2 (two) times daily. (Patient taking differently: Take 1 tablet by mouth as needed.)     No facility-administered medications prior to visit.    PAST MEDICAL HISTORY: Past Medical History:  Diagnosis Date   Acid reflux    Arthritis    Atrial fibrillation (HCC)    Breast cancer (HCC) 2011   Left Breast Cancer   Cancer [facility]) 2011   Left Breast Cancer   Depression     Hyperlipidemia    Hypertension    Insomnia    Osteoporosis    Pacemaker 2010   WIITH DEFIB   Pelvic fracture (HCC) 2010   Thyroid  disease     PAST SURGICAL HISTORY: Past Surgical History:  Procedure Laterality Date   ABDOMINAL HYSTERECTOMY  35 yrs ago   BREAST LUMPECTOMY Left 05/23/10   CARDIAC DEFIBRILLATOR PLACEMENT  2 years ago   CHOLECYSTECTOMY  50 YEARS AGO   ELBOW SURGERY  2006    FAMILY HISTORY: Family History  Problem Relation Age of Onset   Heart disease Mother    Diabetes Father    Stroke Father    Cancer Sister        lung   Cancer Brother        liver   COPD Brother    Heart attack  Brother    Stroke Brother    Heart attack Brother     SOCIAL HISTORY: Social History   Socioeconomic History   Marital status: Widowed    Spouse name: Not on file   Number of children: Not on file   Years of education: Not on file   Highest education level: Not on file  Occupational History   Not on file  Tobacco Use   Smoking status: Never   Smokeless tobacco: Never  Vaping Use   Vaping status: Never Used  Substance and Sexual Activity   Alcohol use: No   Drug use: Never   Sexual activity: Not on file  Other Topics Concern   Not on file  Social History Narrative   Social History      Diet? None       Do you drink/eat things with caffeine? yes      Marital status?             Widowed                        What year were you married? 1946      Do you live in a house, apartment, assisted living, condo, trailer, etc.? house      Is it one or more stories?   2      How many persons live in your home?  3 others       Do you have any pets in your home? (please list)   2 dogs      Highest level of education completed? Junior high school ?      Current or past profession: N/A      Do you exercise?                   No                    Type & how often?        Advanced Directives      Do you have a living will? Yes       Do you have a DNR form?                                   If not, do you want to discuss one?      Do you have signed POA/HPOA for forms? Yes      Functional Status      Do you have difficulty bathing or dressing yourself?  No       Do you have difficulty preparing food or eating? no      Do you have difficulty managing your medications?  Yes      Do you have difficulty managing your finances? Yes       Do you have difficulty affording your medications?  no      Social Drivers of Corporate investment banker Strain: Not on file  Food Insecurity: No Food Insecurity (11/22/2023)   Hunger Vital Sign    Worried About Running Out of Food in the Last Year: Never true    Ran Out of Food in the Last Year: Never true  Transportation Needs: No Transportation Needs (11/22/2023)   PRAPARE - Administrator, Civil Service (Medical): No    Lack of Transportation (Non-Medical): No  Physical Activity: Not on file  Stress:  Not on file  Social Connections: Moderately Isolated (11/22/2023)   Social Connection and Isolation Panel    Frequency of Communication with Friends and Family: More than three times a week    Frequency of Social Gatherings with Friends and Family: More than three times a week    Attends Religious Services: Never    Database administrator or Organizations: No    Attends Banker Meetings: 1 to 4 times per year    Marital Status: Widowed  Intimate Partner Violence: Not At Risk (11/22/2023)   Humiliation, Afraid, Rape, and Kick questionnaire    Fear of Current or Ex-Partner: No    Emotionally Abused: No    Physically Abused: No    Sexually Abused: No    PHYSICAL EXAM  GENERAL EXAM/CONSTITUTIONAL: Vitals:  Vitals:   08/21/24 1509  BP: 128/86  Pulse: 74  Resp: 16  SpO2: (!) 74%  Height: 5' 4 (1.626 m)   Body mass index is 25.78 kg/m. Wt Readings from Last 3 Encounters:  07/11/24 150 lb 3.2 oz (68.1 kg)  11/25/23 171 lb 4.8 oz (77.7 kg)  07/31/23 166 lb (75.3 kg)    Patient is in no distress; well developed, nourished and groomed; neck is supple  MUSCULOSKELETAL: Gait, strength, tone, movements noted in Neurologic exam below  NEUROLOGIC: MENTAL STATUS:     08/21/2024    3:13 PM  MMSE - Mini Mental State Exam  Orientation to time 5  Orientation to Place 5  Registration 3  Attention/ Calculation 2  Recall 0  Language- name 2 objects 2  Language- repeat 0  Language- follow 3 step command 3  Language- read & follow direction 1  Write a sentence 0  Copy design 0  Total score 21   awake, alert, oriented to person, place and time recent and remote memory intact normal attention and concentration language fluent, comprehension intact, naming intact fund of knowledge appropriate  CRANIAL NERVE:  2nd, 3rd, 4th, 6th- visual fields full to confrontation, extraocular muscles intact, no nystagmus 5th - facial sensation symmetric 7th - facial strength symmetric 8th - hearing intact 9th - palate elevates symmetrically, uvula midline 11th - shoulder shrug symmetric 12th - tongue protrusion midline  MOTOR:  normal bulk and tone, full strength in the BUE, BLE  SENSORY:  normal and symmetric to light touch  COORDINATION:  finger-nose-finger, mild resting tremor noted   GAIT/STATION:  Deferred    DIAGNOSTIC DATA (LABS, IMAGING, TESTING) - I reviewed patient records, labs, notes, testing and imaging myself where available.  Lab Results  Component Value Date   WBC 5.4 07/09/2024   HGB 13.8 07/09/2024   HCT 42.4 07/09/2024   MCV 94.9 07/09/2024   PLT 272 07/09/2024      Component Value Date/Time   NA 143 07/09/2024 1034   K 4.3 07/09/2024 1034   CL 107 07/09/2024 1034   CO2 27 07/09/2024 1034   GLUCOSE 122 (H) 07/09/2024 1034   BUN 17 07/09/2024 1034   CREATININE 0.81 07/09/2024 1034   CALCIUM  10.0 07/09/2024 1034   PROT 6.4 07/09/2024 1034   ALBUMIN 3.1 (L) 11/24/2023 0643   AST 28 07/09/2024 1034   ALT 19 07/09/2024 1034    ALKPHOS 44 11/24/2023 0643   BILITOT 0.7 07/09/2024 1034   GFRNONAA >60 11/26/2023 0448   GFRNONAA 71 12/08/2020 0901   GFRAA 83 12/08/2020 0901   Lab Results  Component Value Date   CHOL 149 07/09/2024   HDL 51  07/09/2024   LDLCALC 74 07/09/2024   TRIG 160 (H) 07/09/2024   CHOLHDL 2.9 07/09/2024   Lab Results  Component Value Date   HGBA1C 6.3 (H) 07/09/2024   Lab Results  Component Value Date   VITAMINB12 696 12/11/2018   Lab Results  Component Value Date   TSH 1.07 07/09/2024    Head CT 11/21/2023 No acute intracranial abnormality.    ASSESSMENT AND PLAN  89 y.o. year old female with atrial fibrillation, hypertension, hyperlipidemia, hypothyroidism, anxiety and depression who is presenting with visual hallucination for the past 2 to 3 months.  Visual hallucinations Experiencing visual hallucinations, including seeing deceased relatives and unknown individuals, primarily at night around 3:00 to 3:30 AM after waking up to use the bathroom. These began 2-3 months ago after discharge from a rehabilitation facility. No history of head injury or medication changes correlating with onset. Recognizes hallucinations as not real, which is positive. Differential diagnosis includes medication side effects. Discussed potential risks of Seroquel , including increased somnolence and a black box warning for increased mortality in the elderly. - Order MRI of the brain to rule out structural causes of hallucinations. - Increase Seroquel  to a whole tablet at night and monitor for side effects such as excessive daytime sleepiness. - Educate on the importance of recognizing hallucinations as not real and encourage ignoring them if possible.  Risk of falls History of falls, including a recent fall resulting in rib fractures. At risk of falls due to nocturnal awakenings and potential medication side effects. Concern about risk of falls, especially with increased sedation from medications like  Seroquel . - Monitor for increased sedation with the higher dose of Seroquel  and adjust if necessary to prevent falls. - Discuss with primary care the possibility of medication for bladder control to reduce nighttime awakenings and fall risk.     1. Hallucination      Patient Instructions  Increase Seroquel  to 25 mg nightly and monitor for symptoms Educate patient that these hallucinations are not real, and to try her best to ignore them Follow-up with PCP regarding management of nocturia Please contact me if the symptoms do get worse or if you have any side effect from the increased dose of Seroquel . Return sooner if worse.  Orders Placed This Encounter  Procedures   MR BRAIN WO CONTRAST    No orders of the defined types were placed in this encounter.   Return if symptoms worsen or fail to improve.  I have spent a total of 65 minutes dedicated to this patient today, preparing to see patient, performing a medically appropriate examination and evaluation, ordering tests and/or medications and procedures, and counseling and educating the patient/family/caregiver; independently interpreting result and communicating results to the family/patient/caregiver; and documenting clinical information in the electronic medical record.   Pastor Falling, MD 08/21/2024, 4:47 PM  [facility] [address], [address] [place], [place] [address] [phone]

## 2024-08-21 NOTE — Patient Instructions (Addendum)
 Increase Seroquel  to 25 mg nightly and monitor for symptoms Educate patient that these hallucinations are not real, and to try her best to ignore them Follow-up with PCP regarding management of nocturia Please contact me if the symptoms do get worse or if you have any side effect from the increased dose of Seroquel . Return sooner if worse.

## 2024-08-22 ENCOUNTER — Telehealth: Payer: PPO | Admitting: Family

## 2024-09-05 ENCOUNTER — Ambulatory Visit: Admitting: Podiatry

## 2024-09-05 DIAGNOSIS — M79676 Pain in unspecified toe(s): Secondary | ICD-10-CM | POA: Diagnosis not present

## 2024-09-05 DIAGNOSIS — D2371 Other benign neoplasm of skin of right lower limb, including hip: Secondary | ICD-10-CM

## 2024-09-05 DIAGNOSIS — B351 Tinea unguium: Secondary | ICD-10-CM | POA: Diagnosis not present

## 2024-09-05 DIAGNOSIS — D2372 Other benign neoplasm of skin of left lower limb, including hip: Secondary | ICD-10-CM | POA: Diagnosis not present

## 2024-09-06 NOTE — Progress Notes (Signed)
 She presents today with her granddaughter for chief complaint of painful elongated toenails and calluses to the great toes bilateral.  Objective: Vital signs are stable oriented x 3 pulses are palpable.  Palpable varicosities are noted bilateral mild benign skin lesions to the medial aspect of the first metatarsophalangeal joints bilaterally.  Also painful elongated toenails mildly dystrophic.  Assessment: Pain limb secondary to onychomycosis and nail dystrophy.  Secondary to benign neoplasms bilateral.  Plan: Debridement of benign neoplasms bilateral debridement of toenails 1 through 5 bilateral covered service.

## 2024-09-10 ENCOUNTER — Encounter: Payer: Self-pay | Admitting: Neurology

## 2024-09-10 DIAGNOSIS — R443 Hallucinations, unspecified: Secondary | ICD-10-CM

## 2024-09-10 MED ORDER — QUETIAPINE FUMARATE 25 MG PO TABS: 25.0000 mg | ORAL_TABLET | Freq: Every day | ORAL | 3 refills | Status: DC

## 2024-09-18 NOTE — Telephone Encounter (Signed)
 The Seroquel  was sent to the [facility] since November 4.

## 2024-10-15 DIAGNOSIS — D23112 Other benign neoplasm of skin of right lower eyelid, including canthus: Secondary | ICD-10-CM | POA: Diagnosis not present

## 2024-10-15 DIAGNOSIS — H26491 Other secondary cataract, right eye: Secondary | ICD-10-CM | POA: Diagnosis not present

## 2024-10-15 DIAGNOSIS — H43813 Vitreous degeneration, bilateral: Secondary | ICD-10-CM | POA: Diagnosis not present

## 2024-10-15 DIAGNOSIS — H353132 Nonexudative age-related macular degeneration, bilateral, intermediate dry stage: Secondary | ICD-10-CM | POA: Diagnosis not present

## 2024-10-15 DIAGNOSIS — H52203 Unspecified astigmatism, bilateral: Secondary | ICD-10-CM | POA: Diagnosis not present

## 2024-10-15 DIAGNOSIS — H04123 Dry eye syndrome of bilateral lacrimal glands: Secondary | ICD-10-CM | POA: Diagnosis not present

## 2024-10-28 ENCOUNTER — Other Ambulatory Visit: Payer: Self-pay | Admitting: Family

## 2024-10-28 DIAGNOSIS — E785 Hyperlipidemia, unspecified: Secondary | ICD-10-CM

## 2024-11-26 ENCOUNTER — Encounter: Payer: Self-pay | Admitting: Neurology

## 2024-12-05 ENCOUNTER — Other Ambulatory Visit: Payer: Self-pay | Admitting: Family

## 2024-12-05 DIAGNOSIS — K219 Gastro-esophageal reflux disease without esophagitis: Secondary | ICD-10-CM

## 2024-12-05 MED ORDER — PANTOPRAZOLE SODIUM 40 MG PO TBEC: 40.0000 mg | DELAYED_RELEASE_TABLET | Freq: Every day | ORAL | 1 refills | Status: AC

## 2024-12-05 NOTE — Telephone Encounter (Signed)
 Please advise.

## 2024-12-10 ENCOUNTER — Other Ambulatory Visit: Payer: Self-pay | Admitting: Family

## 2024-12-10 ENCOUNTER — Encounter: Payer: Self-pay | Admitting: Podiatry

## 2024-12-10 ENCOUNTER — Ambulatory Visit: Admitting: Podiatry

## 2024-12-10 DIAGNOSIS — D2371 Other benign neoplasm of skin of right lower limb, including hip: Secondary | ICD-10-CM

## 2024-12-10 DIAGNOSIS — E039 Hypothyroidism, unspecified: Secondary | ICD-10-CM

## 2024-12-10 DIAGNOSIS — B351 Tinea unguium: Secondary | ICD-10-CM

## 2024-12-10 DIAGNOSIS — E038 Other specified hypothyroidism: Secondary | ICD-10-CM

## 2024-12-10 DIAGNOSIS — D2372 Other benign neoplasm of skin of left lower limb, including hip: Secondary | ICD-10-CM

## 2024-12-11 NOTE — Progress Notes (Signed)
 She presents today with her granddaughter for chief complaint of painful elongated toenails and calluses to the great toes bilateral.  Objective: Vital signs are stable oriented x 3 pulses are palpable.  Palpable varicosities are noted bilateral mild benign skin lesions to the medial aspect of the first metatarsophalangeal joints bilaterally.  Also painful elongated toenails mildly dystrophic.  Assessment: Pain limb secondary to onychomycosis and nail dystrophy.  Secondary to benign neoplasms bilateral.  Plan: Debridement of benign neoplasms bilateral debridement of toenails 1 through 5 bilateral covered service.

## 2025-01-01 ENCOUNTER — Other Ambulatory Visit: Payer: Self-pay

## 2025-01-08 ENCOUNTER — Ambulatory Visit: Payer: Self-pay | Admitting: Family

## 2025-03-11 ENCOUNTER — Ambulatory Visit: Admitting: Podiatry
